# Patient Record
Sex: Female | Born: 1935 | Race: White | Hispanic: No | Marital: Married | Smoking: Never smoker
Health system: Southern US, Community
[De-identification: ages and names within clinical notes are randomized; demographics above are authoritative.]

## PROBLEM LIST (undated history)

## (undated) DIAGNOSIS — I10 Essential (primary) hypertension: Secondary | ICD-10-CM

## (undated) DIAGNOSIS — M5136 Other intervertebral disc degeneration, lumbar region: Secondary | ICD-10-CM

## (undated) DIAGNOSIS — M549 Dorsalgia, unspecified: Secondary | ICD-10-CM

## (undated) DIAGNOSIS — E785 Hyperlipidemia, unspecified: Secondary | ICD-10-CM

## (undated) DIAGNOSIS — C4491 Basal cell carcinoma of skin, unspecified: Secondary | ICD-10-CM

## (undated) DIAGNOSIS — M545 Low back pain, unspecified: Secondary | ICD-10-CM

## (undated) DIAGNOSIS — G8929 Other chronic pain: Secondary | ICD-10-CM

## (undated) DIAGNOSIS — N135 Crossing vessel and stricture of ureter without hydronephrosis: Secondary | ICD-10-CM

## (undated) DIAGNOSIS — R102 Pelvic and perineal pain: Secondary | ICD-10-CM

## (undated) DIAGNOSIS — M51369 Other intervertebral disc degeneration, lumbar region without mention of lumbar back pain or lower extremity pain: Secondary | ICD-10-CM

## (undated) HISTORY — DX: Other intervertebral disc degeneration, lumbar region without mention of lumbar back pain or lower extremity pain: M51.369

## (undated) HISTORY — DX: Low back pain: M54.5

## (undated) HISTORY — DX: Crossing vessel and stricture of ureter without hydronephrosis: N13.5

## (undated) HISTORY — PX: POLYPECTOMY: SHX149

## (undated) HISTORY — DX: Essential (primary) hypertension: I10

## (undated) HISTORY — DX: Low back pain, unspecified: M54.50

## (undated) HISTORY — DX: Other intervertebral disc degeneration, lumbar region: M51.36

## (undated) HISTORY — DX: Dorsalgia, unspecified: M54.9

## (undated) HISTORY — DX: Other chronic pain: G89.29

## (undated) HISTORY — DX: Hyperlipidemia, unspecified: E78.5

## (undated) HISTORY — DX: Basal cell carcinoma of skin, unspecified: C44.91

## (undated) HISTORY — DX: Pelvic and perineal pain: R10.2

---

## 1987-05-18 HISTORY — PX: LOBECTOMY: SHX5089

## 1987-05-18 HISTORY — PX: CYSTECTOMY: SUR359

## 1999-10-19 ENCOUNTER — Other Ambulatory Visit: Admission: RE | Admit: 1999-10-19 | Discharge: 1999-10-19 | Payer: Self-pay | Admitting: Obstetrics and Gynecology

## 2002-01-03 ENCOUNTER — Other Ambulatory Visit: Admission: RE | Admit: 2002-01-03 | Discharge: 2002-01-03 | Payer: Self-pay | Admitting: *Deleted

## 2003-05-18 DIAGNOSIS — R102 Pelvic and perineal pain: Secondary | ICD-10-CM

## 2003-05-18 HISTORY — DX: Pelvic and perineal pain: R10.2

## 2003-12-04 ENCOUNTER — Encounter: Admission: RE | Admit: 2003-12-04 | Discharge: 2003-12-04 | Payer: Self-pay | Admitting: Internal Medicine

## 2003-12-09 ENCOUNTER — Encounter: Admission: RE | Admit: 2003-12-09 | Discharge: 2003-12-09 | Payer: Self-pay | Admitting: Internal Medicine

## 2004-01-24 ENCOUNTER — Encounter (INDEPENDENT_AMBULATORY_CARE_PROVIDER_SITE_OTHER): Payer: Self-pay | Admitting: Specialist

## 2004-01-24 ENCOUNTER — Ambulatory Visit (HOSPITAL_COMMUNITY): Admission: RE | Admit: 2004-01-24 | Discharge: 2004-01-24 | Payer: Self-pay | Admitting: Obstetrics and Gynecology

## 2004-01-24 ENCOUNTER — Ambulatory Visit (HOSPITAL_BASED_OUTPATIENT_CLINIC_OR_DEPARTMENT_OTHER): Admission: RE | Admit: 2004-01-24 | Discharge: 2004-01-24 | Payer: Self-pay | Admitting: Obstetrics and Gynecology

## 2004-02-20 ENCOUNTER — Encounter: Payer: Self-pay | Admitting: Internal Medicine

## 2004-04-28 ENCOUNTER — Ambulatory Visit: Payer: Self-pay | Admitting: Internal Medicine

## 2004-07-28 ENCOUNTER — Ambulatory Visit (HOSPITAL_COMMUNITY): Admission: RE | Admit: 2004-07-28 | Discharge: 2004-07-28 | Payer: Self-pay | Admitting: Allergy and Immunology

## 2004-10-29 ENCOUNTER — Ambulatory Visit: Payer: Self-pay | Admitting: Internal Medicine

## 2004-11-18 ENCOUNTER — Encounter: Admission: RE | Admit: 2004-11-18 | Discharge: 2004-11-18 | Payer: Self-pay | Admitting: Internal Medicine

## 2004-11-18 ENCOUNTER — Ambulatory Visit: Payer: Self-pay | Admitting: Internal Medicine

## 2004-11-19 ENCOUNTER — Ambulatory Visit: Payer: Self-pay | Admitting: Internal Medicine

## 2004-12-09 ENCOUNTER — Other Ambulatory Visit: Admission: RE | Admit: 2004-12-09 | Discharge: 2004-12-09 | Payer: Self-pay | Admitting: *Deleted

## 2004-12-29 ENCOUNTER — Ambulatory Visit: Payer: Self-pay | Admitting: Internal Medicine

## 2005-01-29 ENCOUNTER — Ambulatory Visit: Payer: Self-pay | Admitting: Internal Medicine

## 2005-04-26 ENCOUNTER — Ambulatory Visit: Payer: Self-pay | Admitting: Internal Medicine

## 2005-10-29 ENCOUNTER — Ambulatory Visit: Payer: Self-pay | Admitting: Internal Medicine

## 2006-01-19 ENCOUNTER — Ambulatory Visit: Payer: Self-pay | Admitting: Internal Medicine

## 2006-02-09 ENCOUNTER — Ambulatory Visit: Payer: Self-pay | Admitting: Internal Medicine

## 2006-02-15 ENCOUNTER — Other Ambulatory Visit: Admission: RE | Admit: 2006-02-15 | Discharge: 2006-02-15 | Payer: Self-pay | Admitting: Obstetrics & Gynecology

## 2006-09-07 DIAGNOSIS — R0989 Other specified symptoms and signs involving the circulatory and respiratory systems: Secondary | ICD-10-CM | POA: Insufficient documentation

## 2006-10-14 ENCOUNTER — Ambulatory Visit: Payer: Self-pay | Admitting: Internal Medicine

## 2006-10-17 LAB — CONVERTED CEMR LAB
ALT: 18 units/L (ref 0–40)
AST: 20 units/L (ref 0–37)
Albumin: 4 g/dL (ref 3.5–5.2)
Alkaline Phosphatase: 60 units/L (ref 39–117)
BUN: 12 mg/dL (ref 6–23)
Bilirubin, Direct: 0.1 mg/dL (ref 0.0–0.3)
CO2: 28 meq/L (ref 19–32)
Calcium: 9.8 mg/dL (ref 8.4–10.5)
Chloride: 110 meq/L (ref 96–112)
Creatinine, Ser: 0.9 mg/dL (ref 0.4–1.2)
GFR calc Af Amer: 79 mL/min
GFR calc non Af Amer: 66 mL/min
Glucose, Bld: 100 mg/dL — ABNORMAL HIGH (ref 70–99)
Potassium: 4.2 meq/L (ref 3.5–5.1)
Sodium: 143 meq/L (ref 135–145)
TSH: 1.65 microintl units/mL (ref 0.35–5.50)
Total Bilirubin: 0.9 mg/dL (ref 0.3–1.2)
Total Protein: 7.2 g/dL (ref 6.0–8.3)

## 2006-11-30 ENCOUNTER — Ambulatory Visit: Payer: Self-pay | Admitting: Internal Medicine

## 2006-11-30 DIAGNOSIS — N135 Crossing vessel and stricture of ureter without hydronephrosis: Secondary | ICD-10-CM | POA: Insufficient documentation

## 2006-11-30 DIAGNOSIS — I10 Essential (primary) hypertension: Secondary | ICD-10-CM | POA: Insufficient documentation

## 2006-11-30 DIAGNOSIS — Z902 Acquired absence of lung [part of]: Secondary | ICD-10-CM | POA: Insufficient documentation

## 2006-12-03 LAB — CONVERTED CEMR LAB
BUN: 7 mg/dL (ref 6–23)
Basophils Absolute: 0.1 10*3/uL (ref 0.0–0.1)
Basophils Relative: 0.6 % (ref 0.0–1.0)
CO2: 29 meq/L (ref 19–32)
Calcium: 9.5 mg/dL (ref 8.4–10.5)
Chloride: 104 meq/L (ref 96–112)
Cholesterol: 240 mg/dL (ref 0–200)
Creatinine, Ser: 0.8 mg/dL (ref 0.4–1.2)
Direct LDL: 158.3 mg/dL
Eosinophils Absolute: 0.2 10*3/uL (ref 0.0–0.6)
Eosinophils Relative: 2.7 % (ref 0.0–5.0)
GFR calc Af Amer: 91 mL/min
GFR calc non Af Amer: 75 mL/min
Glucose, Bld: 113 mg/dL — ABNORMAL HIGH (ref 70–99)
HCT: 39.8 % (ref 36.0–46.0)
HDL: 37.2 mg/dL — ABNORMAL LOW (ref >39.0)
Hemoglobin: 13.6 g/dL (ref 12.0–15.0)
Hgb A1c MFr Bld: 6.6 % — ABNORMAL HIGH (ref 4.6–6.0)
Lymphocytes Relative: 34.7 % (ref 12.0–46.0)
MCHC: 34.1 g/dL (ref 30.0–36.0)
MCV: 89.4 fL (ref 78.0–100.0)
Monocytes Absolute: 0.9 10*3/uL — ABNORMAL HIGH (ref 0.2–0.7)
Monocytes Relative: 10 % (ref 3.0–11.0)
Neutro Abs: 4.7 10*3/uL (ref 1.4–7.7)
Neutrophils Relative %: 52 % (ref 43.0–77.0)
Platelets: 318 10*3/uL (ref 150–400)
Potassium: 4.1 meq/L (ref 3.5–5.1)
RBC: 4.45 M/uL (ref 3.87–5.11)
RDW: 12.7 % (ref 11.5–14.6)
Sodium: 141 meq/L (ref 135–145)
TSH: 1.42 microintl units/mL (ref 0.35–5.50)
Total CHOL/HDL Ratio: 6.5
Triglycerides: 399 mg/dL (ref 0–149)
VLDL: 80 mg/dL — ABNORMAL HIGH (ref 0–40)
WBC: 9 10*3/uL (ref 4.5–10.5)

## 2006-12-06 ENCOUNTER — Ambulatory Visit: Payer: Self-pay | Admitting: Internal Medicine

## 2007-07-28 ENCOUNTER — Encounter: Payer: Self-pay | Admitting: Internal Medicine

## 2007-08-02 ENCOUNTER — Encounter (INDEPENDENT_AMBULATORY_CARE_PROVIDER_SITE_OTHER): Payer: Self-pay | Admitting: *Deleted

## 2007-08-02 ENCOUNTER — Encounter: Payer: Self-pay | Admitting: Internal Medicine

## 2007-09-08 ENCOUNTER — Encounter (INDEPENDENT_AMBULATORY_CARE_PROVIDER_SITE_OTHER): Payer: Self-pay | Admitting: *Deleted

## 2007-10-11 ENCOUNTER — Ambulatory Visit: Payer: Self-pay | Admitting: Internal Medicine

## 2007-10-11 DIAGNOSIS — G8929 Other chronic pain: Secondary | ICD-10-CM | POA: Insufficient documentation

## 2007-10-11 DIAGNOSIS — M549 Dorsalgia, unspecified: Secondary | ICD-10-CM

## 2007-10-20 ENCOUNTER — Telehealth (INDEPENDENT_AMBULATORY_CARE_PROVIDER_SITE_OTHER): Payer: Self-pay | Admitting: *Deleted

## 2008-05-01 ENCOUNTER — Ambulatory Visit: Payer: Self-pay | Admitting: Internal Medicine

## 2008-05-01 ENCOUNTER — Encounter: Payer: Self-pay | Admitting: Internal Medicine

## 2008-05-01 DIAGNOSIS — E1149 Type 2 diabetes mellitus with other diabetic neurological complication: Secondary | ICD-10-CM | POA: Insufficient documentation

## 2008-05-01 DIAGNOSIS — E785 Hyperlipidemia, unspecified: Secondary | ICD-10-CM | POA: Insufficient documentation

## 2008-05-01 LAB — CONVERTED CEMR LAB
Bilirubin Urine: NEGATIVE
Glucose, Urine, Semiquant: NEGATIVE
Ketones, urine, test strip: NEGATIVE
Nitrite: NEGATIVE
Protein, U semiquant: NEGATIVE
RBC / HPF: NONE SEEN (ref <3)
Specific Gravity, Urine: 1.015
Urobilinogen, UA: 0.2
pH: 5

## 2008-05-02 ENCOUNTER — Telehealth (INDEPENDENT_AMBULATORY_CARE_PROVIDER_SITE_OTHER): Payer: Self-pay | Admitting: *Deleted

## 2008-05-02 ENCOUNTER — Encounter: Payer: Self-pay | Admitting: Internal Medicine

## 2008-05-03 LAB — CONVERTED CEMR LAB
ALT: 20 units/L (ref 0–35)
AST: 26 units/L (ref 0–37)
BUN: 12 mg/dL (ref 6–23)
CO2: 29 meq/L (ref 19–32)
Calcium, Total (PTH): 9.9 mg/dL (ref 8.4–10.5)
Calcium: 10 mg/dL (ref 8.4–10.5)
Chloride: 100 meq/L (ref 96–112)
Cholesterol: 281 mg/dL (ref 0–200)
Creatinine, Ser: 0.7 mg/dL (ref 0.4–1.2)
Creatinine,U: 175.7 mg/dL
Direct LDL: 167.2 mg/dL
GFR calc Af Amer: 105 mL/min
GFR calc non Af Amer: 87 mL/min
Glucose, Bld: 84 mg/dL (ref 70–99)
HDL: 42.2 mg/dL (ref >39.0)
Hemoglobin: 13.6 g/dL (ref 12.0–15.0)
Hgb A1c MFr Bld: 6.7 % — ABNORMAL HIGH (ref 4.6–6.0)
Microalb Creat Ratio: 3.4 mg/g (ref 0.0–30.0)
Microalb, Ur: 0.6 mg/dL (ref 0.0–1.9)
PTH: 33.2 pg/mL (ref 14.0–72.0)
Potassium: 4.3 meq/L (ref 3.5–5.1)
Sodium: 137 meq/L (ref 135–145)
TSH: 1.73 microintl units/mL (ref 0.35–5.50)
Total CHOL/HDL Ratio: 6.7
Triglycerides: 392 mg/dL (ref 0–149)
VLDL: 78 mg/dL — ABNORMAL HIGH (ref 0–40)

## 2008-06-11 ENCOUNTER — Encounter: Admission: RE | Admit: 2008-06-11 | Discharge: 2008-09-09 | Payer: Self-pay | Admitting: Internal Medicine

## 2008-06-11 ENCOUNTER — Encounter: Payer: Self-pay | Admitting: Internal Medicine

## 2008-06-12 ENCOUNTER — Telehealth (INDEPENDENT_AMBULATORY_CARE_PROVIDER_SITE_OTHER): Payer: Self-pay | Admitting: *Deleted

## 2008-09-20 ENCOUNTER — Ambulatory Visit: Payer: Self-pay | Admitting: Internal Medicine

## 2008-09-24 LAB — CONVERTED CEMR LAB
Cholesterol: 260 mg/dL — ABNORMAL HIGH (ref 0–200)
Direct LDL: 174.7 mg/dL
HDL: 37.8 mg/dL — ABNORMAL LOW (ref >39.00)
Hgb A1c MFr Bld: 6.6 % — ABNORMAL HIGH (ref 4.6–6.5)
Total CHOL/HDL Ratio: 7
Triglycerides: 304 mg/dL — ABNORMAL HIGH (ref 0.0–149.0)
VLDL: 60.8 mg/dL — ABNORMAL HIGH (ref 0.0–40.0)

## 2008-10-09 ENCOUNTER — Encounter: Payer: Self-pay | Admitting: Internal Medicine

## 2008-10-25 ENCOUNTER — Encounter (INDEPENDENT_AMBULATORY_CARE_PROVIDER_SITE_OTHER): Payer: Self-pay | Admitting: *Deleted

## 2008-10-31 ENCOUNTER — Telehealth: Payer: Self-pay | Admitting: Internal Medicine

## 2008-12-04 ENCOUNTER — Encounter: Payer: Self-pay | Admitting: Internal Medicine

## 2009-01-13 ENCOUNTER — Encounter: Payer: Self-pay | Admitting: Internal Medicine

## 2009-01-13 ENCOUNTER — Ambulatory Visit: Payer: Self-pay | Admitting: Family Medicine

## 2009-01-28 ENCOUNTER — Encounter (INDEPENDENT_AMBULATORY_CARE_PROVIDER_SITE_OTHER): Payer: Self-pay | Admitting: *Deleted

## 2009-02-11 ENCOUNTER — Encounter (INDEPENDENT_AMBULATORY_CARE_PROVIDER_SITE_OTHER): Payer: Self-pay | Admitting: *Deleted

## 2009-10-02 ENCOUNTER — Telehealth (INDEPENDENT_AMBULATORY_CARE_PROVIDER_SITE_OTHER): Payer: Self-pay | Admitting: *Deleted

## 2009-10-07 ENCOUNTER — Telehealth: Payer: Self-pay | Admitting: Internal Medicine

## 2009-10-08 ENCOUNTER — Ambulatory Visit: Payer: Self-pay | Admitting: Internal Medicine

## 2009-10-10 ENCOUNTER — Telehealth (INDEPENDENT_AMBULATORY_CARE_PROVIDER_SITE_OTHER): Payer: Self-pay | Admitting: *Deleted

## 2009-10-10 LAB — CONVERTED CEMR LAB
ALT: 18 units/L (ref 0–35)
AST: 21 units/L (ref 0–37)
BUN: 12 mg/dL (ref 6–23)
Basophils Absolute: 0.1 10*3/uL (ref 0.0–0.1)
Basophils Relative: 0.6 % (ref 0.0–3.0)
CO2: 29 meq/L (ref 19–32)
Calcium: 9.6 mg/dL (ref 8.4–10.5)
Chloride: 102 meq/L (ref 96–112)
Cholesterol: 248 mg/dL — ABNORMAL HIGH (ref 0–200)
Creatinine, Ser: 0.8 mg/dL (ref 0.4–1.2)
Creatinine,U: 206.7 mg/dL
Direct LDL: 136.2 mg/dL
Eosinophils Absolute: 0.3 10*3/uL (ref 0.0–0.7)
Eosinophils Relative: 3.9 % (ref 0.0–5.0)
GFR calc non Af Amer: 78.95 mL/min (ref >60)
Glucose, Bld: 95 mg/dL (ref 70–99)
HCT: 40.2 % (ref 36.0–46.0)
HDL: 41.9 mg/dL (ref >39.00)
Hemoglobin: 13.9 g/dL (ref 12.0–15.0)
Hgb A1c MFr Bld: 6.6 % — ABNORMAL HIGH (ref 4.6–6.5)
Lymphocytes Relative: 38.4 % (ref 12.0–46.0)
Lymphs Abs: 3.2 10*3/uL (ref 0.7–4.0)
MCHC: 34.6 g/dL (ref 30.0–36.0)
MCV: 90.4 fL (ref 78.0–100.0)
Microalb Creat Ratio: 0.7 mg/g (ref 0.0–30.0)
Microalb, Ur: 1.5 mg/dL (ref 0.0–1.9)
Monocytes Absolute: 0.9 10*3/uL (ref 0.1–1.0)
Monocytes Relative: 10.5 % (ref 3.0–12.0)
Neutro Abs: 3.9 10*3/uL (ref 1.4–7.7)
Neutrophils Relative %: 46.6 % (ref 43.0–77.0)
Platelets: 272 10*3/uL (ref 150.0–400.0)
Potassium: 4.7 meq/L (ref 3.5–5.1)
RBC: 4.45 M/uL (ref 3.87–5.11)
RDW: 13.7 % (ref 11.5–14.6)
Sodium: 141 meq/L (ref 135–145)
Total CHOL/HDL Ratio: 6
Triglycerides: 518 mg/dL — ABNORMAL HIGH (ref 0.0–149.0)
VLDL: 103.6 mg/dL — ABNORMAL HIGH (ref 0.0–40.0)
WBC: 8.4 10*3/uL (ref 4.5–10.5)

## 2009-10-27 ENCOUNTER — Encounter (INDEPENDENT_AMBULATORY_CARE_PROVIDER_SITE_OTHER): Payer: Self-pay | Admitting: *Deleted

## 2009-10-30 ENCOUNTER — Telehealth (INDEPENDENT_AMBULATORY_CARE_PROVIDER_SITE_OTHER): Payer: Self-pay | Admitting: *Deleted

## 2009-11-27 ENCOUNTER — Encounter (INDEPENDENT_AMBULATORY_CARE_PROVIDER_SITE_OTHER): Payer: Self-pay | Admitting: *Deleted

## 2009-12-01 ENCOUNTER — Ambulatory Visit: Payer: Self-pay | Admitting: Internal Medicine

## 2009-12-08 ENCOUNTER — Ambulatory Visit: Payer: Self-pay | Admitting: Internal Medicine

## 2009-12-09 ENCOUNTER — Encounter: Payer: Self-pay | Admitting: Internal Medicine

## 2010-01-13 ENCOUNTER — Encounter: Payer: Self-pay | Admitting: Internal Medicine

## 2010-01-30 ENCOUNTER — Encounter (INDEPENDENT_AMBULATORY_CARE_PROVIDER_SITE_OTHER): Payer: Self-pay | Admitting: *Deleted

## 2010-02-02 ENCOUNTER — Ambulatory Visit: Payer: Self-pay | Admitting: Internal Medicine

## 2010-02-09 ENCOUNTER — Ambulatory Visit: Payer: Self-pay | Admitting: Internal Medicine

## 2010-02-09 LAB — CONVERTED CEMR LAB
Cholesterol: 293 mg/dL — ABNORMAL HIGH (ref 0–200)
Direct LDL: 159.6 mg/dL
HDL: 42.4 mg/dL (ref >39.00)
Hgb A1c MFr Bld: 6.9 % — ABNORMAL HIGH (ref 4.6–6.5)
TSH: 1.66 microintl units/mL (ref 0.35–5.50)
Total CHOL/HDL Ratio: 7
Triglycerides: 544 mg/dL — ABNORMAL HIGH (ref 0.0–149.0)
VLDL: 108.8 mg/dL — ABNORMAL HIGH (ref 0.0–40.0)

## 2010-04-15 ENCOUNTER — Encounter: Payer: Self-pay | Admitting: Internal Medicine

## 2010-06-10 ENCOUNTER — Other Ambulatory Visit: Payer: Self-pay | Admitting: Internal Medicine

## 2010-06-10 ENCOUNTER — Ambulatory Visit
Admission: RE | Admit: 2010-06-10 | Discharge: 2010-06-10 | Payer: Self-pay | Source: Home / Self Care | Attending: Internal Medicine | Admitting: Internal Medicine

## 2010-06-10 LAB — BASIC METABOLIC PANEL
BUN: 13 mg/dL (ref 6–23)
CO2: 27 mEq/L (ref 19–32)
Calcium: 9.6 mg/dL (ref 8.4–10.5)
Chloride: 104 mEq/L (ref 96–112)
Creatinine, Ser: 0.9 mg/dL (ref 0.4–1.2)
GFR: 68.33 mL/min (ref >60.00)
Glucose, Bld: 122 mg/dL — ABNORMAL HIGH (ref 70–99)
Potassium: 4.5 mEq/L (ref 3.5–5.1)
Sodium: 139 mEq/L (ref 135–145)

## 2010-06-10 LAB — HEMOGLOBIN A1C: Hgb A1c MFr Bld: 6.8 % — ABNORMAL HIGH (ref 4.6–6.5)

## 2010-06-16 NOTE — Letter (Signed)
Summary: Results Follow up Letter  [facility] [place]  [address] [place], [place] [address]   Phone: [phone]  Fax: [phone]    10/25/2008 MRN: [id number]  ANEISA KARREN [address] [place], [place]  [address]  Dear Ms. Jimmerson,  The following are the results of your recent test(s):  Test         Result    Pap Smear:        Normal _____  Not Normal _____ Comments: ______________________________________________________ Cholesterol: LDL(Bad cholesterol):         Your goal is less than:         HDL (Good cholesterol):       Your goal is more than: Comments:  ______________________________________________________ Mammogram:        Normal __x___  Not Normal _____ Comments:  _____________Your mammogram was normal.  Next mammogram is due in 1 year.  You will still need to have a yearly breast exam.  We can do that @ your next office visit.  Please call me if you have any questions. ______________________________________________________ Hemoccult:        Normal _____  Not normal _______ Comments:    _____________________________________________________________________ Other Tests:    We routinely do not discuss normal results over the telephone.  If you desire a copy of the results, or you have any questions about this information we can discuss them at your next office visit.   Sincerely,

## 2010-06-16 NOTE — Miscellaneous (Signed)
Summary: [facility] PV  Clinical Lists Changes  Medications: Added new medication of MIRALAX   POWD (POLYETHYLENE GLYCOL 3350) As per prep  instructions. - Signed Added new medication of DULCOLAX 5 MG  TBEC (BISACODYL) Day before procedure take 2 at 3pm and 2 at 8pm. - Signed Added new medication of REGLAN 10 MG  TABS (METOCLOPRAMIDE HCL) As per prep instructions. - Signed Rx of MIRALAX   POWD (POLYETHYLENE GLYCOL 3350) As per prep  instructions.;  #255gm x 0;  Signed;  Entered by: Ezra Sites RN;  Authorized by: Hart Carwin MD;  Method used: Electronically to [facility]. #[id number]*, [address] [place], [place], [place]  [address], Ph: [phone], Fax: [phone] Rx of DULCOLAX 5 MG  TBEC (BISACODYL) Day before procedure take 2 at 3pm and 2 at 8pm.;  #4 x 0;  Signed;  Entered by: Ezra Sites RN;  Authorized by: Hart Carwin MD;  Method used: Electronically to [facility]. #[id number]*, [address] [place], [place], [place]  [address], Ph: [phone], Fax: [phone] Rx of REGLAN 10 MG  TABS (METOCLOPRAMIDE HCL) As per prep instructions.;  #2 x 0;  Signed;  Entered by: Ezra Sites RN;  Authorized by: Hart Carwin MD;  Method used: Electronically to [facility]. #[id number]*, [address] [place], [place], [place]  [address], Ph: [phone], Fax: [phone]    Prescriptions: REGLAN 10 MG  TABS (METOCLOPRAMIDE HCL) As per prep instructions.  #2 x 0   Entered by:   Ezra Sites RN   Authorized by:   Hart Carwin MD   Signed by:   Ezra Sites RN on 12/01/2009   Method used:   Electronically to        [facility]. [phone]* (retail)       [phone] [address].       [place], [place]  [address]       Ph: [phone]       Fax: [phone]   RxID:   [id number] DULCOLAX 5 MG  TBEC (BISACODYL) Day before procedure take 2 at 3pm and 2 at 8pm.  #4 x 0   Entered by:   Ezra Sites RN   Authorized  by:   Hart Carwin MD   Signed by:   Ezra Sites RN on 12/01/2009   Method used:   Electronically to        [facility]. [phone]* (retail)       [phone] [address].       [place], [place]  [address]       Ph: [phone]       Fax: [phone]   RxID:   [id number] MIRALAX   POWD (POLYETHYLENE GLYCOL 3350) As per prep  instructions.  #255gm x 0   Entered by:   Ezra Sites RN   Authorized by:   Hart Carwin MD   Signed by:   Ezra Sites RN on 12/01/2009   Method used:   Electronically to        [facility]. [phone]* (retail)       [phone] [address].       [place], [place]  [address]       Ph: [phone]       Fax: [phone]   RxID:  [id number]  

## 2010-06-16 NOTE — Letter (Signed)
Summary: Patient Cancellation/[facility]  Patient Cancellation/[facility]   Imported By: Lanelle Bal 02/16/2010 08:59:20  _____________________________________________________________________  External Attachment:    Type:   Image     Comment:   External Document

## 2010-06-16 NOTE — Letter (Signed)
Summary: Allergy OV note  Allergy   Imported By: Freddy Jaksch 08/15/2007 10:40:02  _____________________________________________________________________  External Attachment:    Type:   Image     Comment:   External Document

## 2010-06-16 NOTE — Progress Notes (Signed)
Summary: paz--rx mobic  Phone Note Refill Request   Refills Requested: Medication #1:  MOBIC 7.5 MG TABS once daily as needed [facility] [address]--[phone] for 90 day supply  Initial call taken by: Freddy Jaksch,  June 12, 2008 4:18 PM  Follow-up for Phone Call        last ov 05/01/08, last refill #30 x 6 on 10/11/07 Follow-up by: Kandice Hams,  June 13, 2008 10:13 AM  Additional Follow-up for Phone Call Additional follow up Details #1::        ok 30 and 6RF Additional Follow-up by: [facility] E. Paz MD,  June 13, 2008 1:36 PM      Prescriptions: MOBIC 7.5 MG TABS (MELOXICAM) once daily as needed  #90 x 1   Entered by:   Kandice Hams   Authorized by:   Nolon Rod. Paz MD   Signed by:   Kandice Hams on 06/13/2008   Method used:   Re-Faxed to ...       [facility]. [phone]* (retail)       [phone] [address].       [place], [place]  [address]       Ph: [phone]       Fax: [phone]   RxID:   [id number] MOBIC 7.5 MG TABS (MELOXICAM) once daily as needed  #30 x 6   Entered by:   Kandice Hams   Authorized by:   Nolon Rod. Paz MD   Signed by:   Kandice Hams on 06/13/2008   Method used:   Faxed to ...       [facility]. [phone]* (retail)       [phone] [address].       [place], [place]  [address]       Ph: [phone]       Fax: [phone]   RxID:   [id number]

## 2010-06-16 NOTE — Progress Notes (Signed)
Summary: Schedule Colonoscopy.   Phone Note Outgoing Call Call back at [facility] Phone [phone]   Call placed by: Harlow Mares CMA Duncan Dull),  Oct 07, 2009 11:59 AM Call placed to: Patient Summary of Call: the phone number just rings, patient is due for her colonoscopy. Initial call taken by: Harlow Mares CMA Duncan Dull),  Oct 07, 2009 11:59 AM  Follow-up for Phone Call        colonosocpy scheduled for 12/08/2009 11am and previsit on 12/01/2009 at 10:30am Follow-up by: Harlow Mares CMA Duncan Dull),  October 27, 2009 12:55 PM

## 2010-06-16 NOTE — Progress Notes (Signed)
Summary: paz--Eye medicine to continue or not/Please See  Phone Note Call from Patient Call back at Home Phone [phone] Call back at [phone]   Caller: Patient Summary of Call: Pt is calling staten that she was seen on Wed of last week was given eye drop for her eyes and she still have eye drop left that she was to use for 5 days and want to know should she contine of have more.  Initial call taken by: Freddy Jaksch,  October 20, 2007 9:06 AM  Follow-up for Phone Call        Spoke with pt who was seen 10/11/07 for conjuctivitis and wasg fiven gentamycin abx , pt says EYES ARE BETTER, one better than the other still have some crusting, still have a little left instuctions were to use for 5 days used for 7 days using cold compresses on eye pt sais she has a little abx drops left please advise  [facility].Kandice Hams  October 20, 2007 9:45 AM  Follow-up by: Kandice Hams,  October 20, 2007 9:45 AM  Additional Follow-up for Phone Call Additional follow up Details #1::        allergic conjuntivitis? advise pt: stop Abx eye drops start Alocril, see RX....Marland KitchenMarland KitchenJose E. Paz MD  October 22, 2007 9:09 AM     Additional Follow-up for Phone Call Additional follow up Details #2::    CALLED PATIENT AT [phone], UNABLE TO REACH PATIENT, VOICE MAILBOX NOT SET UP YET. CALLED PATIENT ON HOME NUMBER, GAVE DR.PAZ'S INSTRUCTION, PATIENT INFORMED DR.PAZ SENT RX TO PHARMACY ALREADY. Shonna Chock  October 23, 2007 9:31 AM   New/Updated Medications: ALOCRIL 2 %  SOLN (NEDOCROMIL SODIUM) 2 drops on each eye two times a day  x 4 weeks   Prescriptions: ALOCRIL 2 %  SOLN (NEDOCROMIL SODIUM) 2 drops on each eye two times a day  x 4 weeks  #1 x 1   Entered by:   Jose E. Paz MD   Authorized by:   Madilyn Fireman Triage Nurse   Signed by:   Nolon Rod Paz MD on 10/22/2007   Method used:   Electronically sent to ...       [facility]. #[id number]*       [phone]       [place], [place]   [address]       Ph: [phone]       Fax: [phone]   RxID:   [id number]

## 2010-06-16 NOTE — Assessment & Plan Note (Signed)
Summary: yearly check/cbs   Vital Signs:  Patient profile:   77 year old female Height:      66 inches Weight:      197.38 pounds Pulse rate:   84 / minute Pulse rhythm:   regular BP sitting:   134 / 82  (left arm) Cuff size:   large  Vitals Entered By: Army Fossa CMA (February 02, 2010 10:02 AM) CC: Yearly check, fasting Comments pharm- [facility] [address] flu shot Due for bone density   History of Present Illness: Here for Medicare AWV:  1.   Risk factors based on Past M, S, F history: yes  2.   Physical Activities: active , [facility], has lost some wt  3.   Depression/mood: occasionally feels "down" take OTC "[place]" and helps , no severe                 symptoms, no suicidal  4.   Hearing: slightly  decreased L>R, not causing problems but will refer to an audiologist 5.   ADL's: totally independent  6.   Fall Risk: low  7.   Home Safety: feeels safe at home  8.   Height, weight, &visual acuity:, see VS, vision decreased L>R, just saw the eye doctor 9.   Counseling: yes  10.   Labs ordered based on risk factors: yes  11.           Referral Coordination: if needed  12.           Care Plan: see a/p  13.            Cognitive Assessment , motor skills, memory and cognition seem appropriate  in addition, we discussed the following issues  HYPERTENSION -- ambulatory BPs in the 130s/80s  AODM -- no ambulatory CBGs , diet has improved a little  h/o COLONIC POLYPS -- last Cscope reviewed  Hyperlipidemia-- on no meds, trying to work on life style     Preventive Screening-Counseling & Management  Alcohol-Tobacco     Alcohol type: wine  Caffeine-Diet-Exercise     Does Patient Exercise: yes     [facility]     Times/week: 3  Current Medications (verified): 1)  Furosemide 20 Mg  Tabs (Furosemide) .Marland Kitchen.. 1by Mouth Once Daily 2)  Loratadine 10 Mg Tabs (Loratadine) .... Take 1 Tablet By Mouth Once A Day 3)  Lisinopril  20 Mg Tabs (Lisinopril) .... One By Mouth Daily 4)  Mobic 7.5 Mg Tabs (Meloxicam) .... Once Daily As Needed 5)  Nasonex 50 Mcg/act Susp (Mometasone Furoate) .... Spray 2 Spray Into Both Nostrils Every Morning 6)  Allergy Injections 7)  Fish Oil 8)  Vitamin D 9)  Multivitamins   Tabs (Multiple Vitamin) 10)  Tylenol 11)  Maalox 12)  Vitamin C 13)  Aspirin Adult Low Strength 81 Mg Tbec (Aspirin) .Marland Kitchen.. 1 A Day  Allergies: 1)  ! Sulfa 2)  ! Ceftin 3)  ! * Cefzil 4)  ! Pcn 5)  ! Suprax 6)  ! Macrobid  Past History:  Past Medical History: Reviewed history from 09/20/2008 and no changes required. 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 RML lobectomy  1989  thyroid cystectomy, Colon polypectomy  Family History: Reviewed history from 05/01/2008 and no changes required. Family History of Arthritis Diabetes-- B x 2  hypertension-- B, M  brain tumor--M breast Ca--no colon ca--no  Social History: Reviewed history from 10/08/2009 and no changes required. Married four children Retired, Lawyer  tobacco-- never ETOH-- rarely diet-- not good  "terrible" exercise--none  Does Patient Exercise:  yes  Review of Systems CV:  Denies chest pain or discomfort and palpitations; 0cc swelling feet . Resp:  Denies cough, shortness of breath, and wheezing. GI:  Denies bloody stools, diarrhea, nausea, and vomiting; occasionally feels bloated . GU:  Denies dysuria and hematuria.  Physical Exam  General:  alert and well-developed.  alert and well-developed.   Neck:  no  masses, no thyromegaly, and normal carotid upstroke.   Lungs:  normal respiratory effort, no intercostal retractions, and no accessory muscle use.    Heart:  normal rate, regular rhythm, and no murmur.   Abdomen:  soft, non-tender, no distention, no masses, no guarding, and no rigidity.    Extremities:  no pretibial edema bilaterally  Psych:  Oriented X3, memory intact for recent and remote, normally interactive, good eye contact, not anxious appearing, and not depressed appearing.     Impression & Recommendations:  Problem # 1:  HEALTH MAINTENANCE EXAM (ICD-V70.0) Td 2009 pneumonia shot 04-2005 Flu shot today Shingles immunization discussed, printed material provided  -- will do today   Female care per gynecology. due to see them, encouraged  to do mammogram 12/2009, normal PAPs per gyn   DEXA 11-2006, normaL, and 12-2008 normal as well    last colonoscopy 7-11, next in 5 years   refer to a audiologist for decreased hearing     Orders: Misc. Referral (Misc. Ref) Medicare -1st Annual Wellness Visit [phone])  Problem # 2:  DISEASE, LUNG, OTHER NEC (ICD-518.89) used to get chest x-rays yearly, lites a chest x-ray done at this time  Orders: T-2 View CXR (71020TC) Specimen Handling (30865)  Problem # 3:  HYPERLIPIDEMIA (ICD-272.4) improving lifestyle, labs Intolerant to  Trilipix  Niaspan-- intolerant as well, shaky , flushed  The following medications were removed from the medication list:    Trilipix 135 Mg Cpdr (Choline fenofibrate) .Marland Kitchen... 1 by mouth once daily  Orders: Venipuncture (78469) TLB-Lipid Panel (80061-LIPID) TLB-TSH (Thyroid Stimulating Hormone) (84443-TSH) Specimen Handling (62952)  Problem # 4:  HYPERTENSION (ICD-401.9) at goal  Her updated medication list for this problem includes:    Furosemide 20 Mg Tabs (Furosemide) .Marland KitchenMarland KitchenMarland KitchenMarland Kitchen 1by mouth once daily    Lisinopril 20 Mg Tabs (Lisinopril) ..... One by mouth daily  BP today: 134/82 Prior BP: 150/92  (10/08/2009)  Labs Reviewed: K+: 4.7 (10/08/2009) Creat: : 0.8 (10/08/2009)   Chol: 248 (10/08/2009)   HDL: 41.90 (10/08/2009)   LDL: DEL (05/01/2008)   TG: 518.0 (10/08/2009)  Problem # 5:  AODM (ICD-250.00) one diet only Her updated medication list for this problem includes:    Lisinopril 20 Mg Tabs (Lisinopril) ..... One by mouth daily    Aspirin Adult Low Strength 81 Mg Tbec (Aspirin) .Marland Kitchen... 1 a day    Labs Reviewed: Creat: 0.8 (10/08/2009)    Reviewed HgBA1c results: 6.6 (10/08/2009)  6.6 (09/20/2008)  Orders: TLB-A1C / Hgb A1C (Glycohemoglobin) (83036-A1C) Specimen Handling ([address])  Complete Medication List: 1)  Furosemide 20  Mg Tabs (Furosemide) .Marland Kitchen.. 1by mouth once daily 2)  Loratadine 10 Mg Tabs (Loratadine) .... Take 1 tablet by mouth once a day 3)  Lisinopril 20 Mg Tabs (Lisinopril) .... One by mouth daily 4)  Mobic 7.5 Mg Tabs (Meloxicam) .... Once daily as needed 5)  Nasonex 50 Mcg/act Susp (Mometasone furoate) .... Spray 2 spray into both nostrils every morning 6)  Allergy Injections  7)  Fish Oil  8)  Vitamin D  9)  Multivitamins Tabs (Multiple vitamin) 10)  Tylenol  11)  Maalox  12)  Vitamin C  13)  Aspirin Adult Low Strength 81 Mg Tbec (Aspirin) .Marland Kitchen.. 1 a day  Other Orders: Flu Vaccine 69yrs + ([address]) Administration Flu vaccine - MCR (G0008) Zoster (Shingles) Vaccine Live (60454) Admin 1st Vaccine (09811) Admin of Any Addtl Vaccine (91478) Admin 1st Vaccine (State) [id number]) Admin of Any Addtl Vaccine (State) (30865H)  Patient Instructions: 1)  Please schedule a follow-up appointment in 4 months .  Prescriptions: FUROSEMIDE 20 MG  TABS (FUROSEMIDE) 1by mouth once daily  #90 Tablet x 1   Entered by:   Army Fossa CMA   Authorized by:   Nolon Rod. Paz MD   Signed by:   Army Fossa CMA on 02/02/2010   Method used:   Electronically to        [facility]. [phone]* (retail)       [phone] [address].       [place], [place]  [address]       Ph: [phone]       Fax: [phone]   RxID:   [id number] LISINOPRIL 20 MG TABS (LISINOPRIL) one by mouth daily  #90 x 1   Entered by:   Army Fossa CMA   Authorized by:   Nolon Rod. Paz MD   Signed by:   Army Fossa CMA on 02/02/2010   Method used:   Electronically to        [facility]. [phone]* (retail)       [phone] [address].       [place], [place]  [address]       Ph: [phone]       Fax: [phone]   RxID:   [id number]       Risk Factors:     Type:  wine Exercise:  yes    Times per week:  3    Type[facility]  [facility] Flu Vacc[facility] Consent Questions     Do you have a history of severe allergic reactions to this vaccine? no    Any prior history of allergic reactions to egg and/or gelatin? no    Do you have a sensitivity to the preservative Thimersol? no    Do you have a past history of Guillan-Barre Syndrome? no    Do you currently have an acute febrile illness? no    Have you ever had a severe reaction to latex? no    Vaccine information given and explained to patient? yes    Are you currently pregnant? no    Lot Number:[id number]   Exp Date:11/13/2009   Site Given  Left Deltoid [facility] to [facility]     .lbmedflu   Zostavax # 1    Vaccine Type: Zostavax    Site: right deltoid    Mfr: Merck    Dose: 0.5 ml    Route: [place]  Given by: Army Fossa CMA    Exp. Date: 01/02/2011    Lot #: [id number]

## 2010-06-16 NOTE — Letter (Signed)
Summary: [facility]  [facility]   Imported By: Lanelle Bal 12/16/2008 12:17:36  _____________________________________________________________________  External Attachment:    Type:   Image     Comment:   External Document

## 2010-06-16 NOTE — Letter (Signed)
Summary: Results Follow up Letter  [facility] [place]  [address] [place], [place] [address]   Phone: [phone]  Fax: [phone]    02/11/2009 MRN: [id number]  Natalie [name] [address] [place], [place]  [address]  Dear Ms. Reffitt,  The following are the results of your recent test(s):  Test         Result    Pap Smear:        Normal _____  Not Normal _____ Comments: ______________________________________________________ Cholesterol: LDL(Bad cholesterol):         Your goal is less than:         HDL (Good cholesterol):       Your goal is more than: Comments:  ______________________________________________________ Mammogram:        Normal _____  Not Normal _____ Comments:  ___________________________________________________________________ Hemoccult:        Normal _____  Not normal _______ Comments:    _____________________________________________________________________ Other Tests:  Dr. Drue Novel reviewed the results of your bone density test.  Your bone denisity test was normal.  Please continue taking calcium & vitamin d daily.  Call me with any questions or concerns. Alena Bills [phone] ext [phone]

## 2010-06-16 NOTE — Progress Notes (Signed)
Summary: due ov  Phone Note Outgoing Call Call back at [facility] Phone [phone]   Summary of Call: Due office visit for additional refills.Marland KitchenMarland KitchenShary Decamp  Oct 02, 2009 11:46 AM   Follow-up for Phone Call        Patient is coming in on 5.25.11 Follow-up by: Harold Barban,  Oct 02, 2009 1:15 PM

## 2010-06-16 NOTE — Letter (Signed)
Summary: Previsit letter  [facility]  [address] [place], [place] [address]   Phone: [phone]  Fax: [phone]       10/27/2009 MRN: [id number]  Natalie [name] [address] [place], [place]  [address]  Dear Ms. Klunk,  Welcome to the Gastroenterology Division at [facility].    You are scheduled to see a nurse for your pre-procedure visit on 12/01/2009 at 10:30am on the 3rd floor at [facility], [address]  We ask that you try to arrive at our office 15 minutes prior to your appointment time to allow for check-in.  Your nurse visit will consist of discussing your medical and surgical history, your immediate family medical history, and your medications.    Please bring a complete list of all your medications or, if you prefer, bring the medication bottles and we will list them.  We will need to be aware of both prescribed and over the counter drugs.  We will need to know exact dosage information as well.  If you are on blood thinners (Coumadin, Plavix, Aggrenox, Ticlid, etc.) please call our office today/prior to your appointment, as we need to consult with your physician about holding your medication.   Please be prepared to read and sign documents such as consent forms, a financial agreement, and acknowledgement forms.  If necessary, and with your consent, a friend or relative is welcome to sit-in on the nurse visit with you.  Please bring your insurance card so that we may make a copy of it.  If your insurance requires a referral to see a specialist, please bring your referral form from your primary care physician.  No co-pay is required for this nurse visit.     If you cannot keep your appointment, please call [phone] to cancel or reschedule prior to your appointment date.  This allows [place] the opportunity to schedule an appointment for another patient in need of care.    Thank you for choosing [facility] for your medical  needs.  We appreciate the opportunity to care for you.  Please visit [place] at our website  to learn more about our practice.                     Sincerely.                                                                                                                   The Gastroenterology Division

## 2010-06-16 NOTE — Procedures (Signed)
Summary: Colonoscopy  Patient: [name] Note: All result statuses are Final unless otherwise noted.  Tests: (1) Colonoscopy (COL)   COL Colonoscopy           [facility]      [facility]     [address]     [place], [place]  [address]           COLONOSCOPY PROCEDURE REPORT           PATIENT:  Natalie [name], Natalie [name]  MR#:  [id number]     BIRTHDATE:  [birth year], 76 yrs. old  GENDER:  female     ENDOSCOPIST:  Hedwig Morton. Juanda Chance, MD     REF. BY:     PROCEDURE DATE:  12/08/2009     PROCEDURE:  Colonoscopy 332[address]     ASA CLASS:  Class II     INDICATIONS:  Elevated Risk Screening adenomatous polyps x 2 2005,           MEDICATIONS:   Versed 8 mg, Fentanyl 100 mcg           DESCRIPTION OF PROCEDURE:   After the risks benefits and     alternatives of the procedure were thoroughly explained, informed     consent was obtained.  Digital rectal exam was performed and     revealed no rectal masses.   The LB CF-[id number] endoscope     was introduced through the anus and advanced to the cecum, which     was identified by both the appendix and ileocecal valve, without     limitations.  The quality of the prep was adequate, using MiraLax.     The instrument was then slowly withdrawn as the colon was fully     examined.     <<PROCEDUREIMAGES>>           FINDINGS:  A sessile polyp was found in the rectum. frisble soft 1     cm polyp proximal to the dentate line Polyp was snared without     cautery. Retrieval was successful. snare polyp Polyp was snared,     then cauterized with monopolar cautery. Retrieval was successful     (see image13, image12, image11, image10, and image9). snare polyp     submucosal injection residual bleeding from the polypectomy site     1cc of 1: 10,000 Epinephrine injected into the polypectomy site     Internal hemorrhoids were found (see image9).  Moderate     diverticulosis was found in the sigmoid colon (see image7, image2,     and image1).  This was otherwise a  normal examination of the colon     (see image3, image4, and image5).   Retroflexed views in the     rectum revealed no abnormalities.    The scope was then withdrawn     from the patient and the procedure completed.           COMPLICATIONS:  None     ENDOSCOPIC IMPRESSION:     1) Sessile polyp in the rectum     2) Internal hemorrhoids     3) Moderate diverticulosis in the sigmoid colon     4) Otherwise normal examination     s/p Epi injection of postpolypectomy site     RECOMMENDATIONS:     1) Await pathology results     no ASA x 1 week     REPEAT EXAM:  In 5 year(s) for.  ______________________________     Hedwig Morton. Juanda Chance, MD           CC:           n.     eSIGNED:   Hedwig Morton. Roderica Cathell at 12/08/2009 12:14 PM           Page 2 of 3   Failla, Maeser, [id number]  Note: An exclamation mark (!) indicates a result that was not dispersed into the flowsheet. Document Creation Date: 12/08/2009 12:15 PM _______________________________________________________________________  (1) Order result status: Final Collection or observation date-time: 12/08/2009 12:03 Requested date-time:  Receipt date-time:  Reported date-time:  Referring Physician:   Ordering Physician: Lina Sar [phone]) Specimen Source:  Source: Launa Grill Order Number: [id number] Lab site:   Appended Document: Colonoscopy     Procedures Next Due Date:    Colonoscopy: 12/2014

## 2010-06-16 NOTE — Progress Notes (Signed)
Summary: discuss labs  Phone Note Call from Patient Call back at Home Phone [phone]   Caller: Patient Summary of Call: patient was given rx for macrobid - she checked & this is the med she  had a reaction to  - [facility] [place]   Initial call taken by: Okey Regal [place],  May 02, 2008 8:47 AM  Follow-up for Phone Call        left message with husband to have patient return call.........Marland KitchenShary Decamp  May 02, 2008 10:05 AM --add macrobid to allergy list --Will call new Rx w/ UCx results Jose E. Paz MD  May 02, 2008 12:27 PM  advise  patient: tests normal, no further workup needed at this point Signed by [facility] E. Paz MD on 05/03/2008 at 6:30 AM advise patient diabetes stable start simvastatin 40 mg nightly office visit in 3 months, fasting patient to report any side effects Signed by Nolon Rod. Paz MD on 05/03/2008 at 6:32 AM    Additional Follow-up for Phone Call Additional follow up Details #1::        Pt called back, returning call, informed pt  [facility] called to inform her, they will call in a new Rx in place of Macrobid when UCX are back. Pt said she will call [facility] tomorrow in ref to [facility] .Kandice Hams  May 02, 2008 2:32 PM  Additional Follow-up by: Kandice Hams,  May 02, 2008 2:32 PM   New Allergies: ! MACROBID Additional Follow-up for Phone Call Additional follow up Details #2::    advise  patient: tests normal, no further workup needed at this point Signed by [facility] E. Paz MD on 05/03/2008 at 6:30 AM advise patient diabetes stable start simvastatin 40 mg nightly office visit in 3 months, fasting patient to report any side effects Signed by Nolon Rod. Paz MD on 05/03/2008 at 6:32 AM   Additional Follow-up for Phone Call Additional follow up Details #3:: Details for Additional Follow-up Action Taken: advise patient: UCX inconclusive. If still has dysuria: reculture urine, if no bladder symptoms, no further treatment   left message on  machine ..............Marland KitchenDoristine Devoid  May 07, 2008 1:54 PM  spoke with patient about urine cx aware nothing seen says she isnt  having a difficulty going and aware of labs would like to try doing something natural informed pt diet and exercise and fish oil may also help but as far as treating high cholestrol b/c she has tried simvastatin in the past and didn't like the side effects informed her that I would like Dr. Drue Novel know and appt to follow up in 3 months ..........Marland KitchenDoristine Devoid  May 07, 2008 3:06 PM   I really recommend medication, offer a trial w/  pravachol 40mg  once daily ; if declines, then just RTC in 3 months  Jose E. Paz MD  May 07, 2008 3:25 PM   left msg w/ husband to have patient return call .................Marland KitchenDoristine Devoid  May 09, 2008 12:09 PM   spoke with patient aware of recommendation will discuss further at 3 month roa..............Marland KitchenDoristine Devoid  May 13, 2008 9:57 AM   New/Updated Medications: SIMVASTATIN 40 MG TABS (SIMVASTATIN) 1 by mouth at bedtime New Allergies: ! MACROBID

## 2010-06-16 NOTE — Assessment & Plan Note (Signed)
Summary: CPX/TL  Medications Added LORATADINE 10 MG TABS (LORATADINE) Take 1 tablet by mouth once a day LISINOPRIL 10 MG TABS (LISINOPRIL) 1 by mouth qd MOBIC 7.5 MG TABS (MELOXICAM) once daily as needed NASONEX 50 MCG/ACT SUSP (MOMETASONE FUROATE) Spray 2 spray into both nostrils every morning * FISH OIL  * VITAMIN D  MULTIVITAMINS   TABS (MULTIPLE VITAMIN)  * TYLENOL  * MAALOX  * VITAMIN C  * COQ10  * ALLERGY INJECTIONS       Allergies Added: ! SULFA ! CEFTIN ! * CEFZIL ! PCN ! SUPRAX  Vital Signs:  Patient Profile:   77 Years Old Female Weight:      199.4 pounds Pulse rate:   80 / minute Pulse rhythm:   regular BP sitting:   122 / 80  (left arm)  Vitals Entered By: Shary Decamp (November 30, 2006 8:09 AM)               Chief Complaint:  yearly; went to derm with a scalp problem - wants to discuss with Dr. Drue Novel; pt has decreased dosage of diuretic because of increased urination -decreased to 1/2 dose on vacation now back up to 3/4 dose; c/o feet numb.  History of Present Illness: here for her yearly exam multiple questions and concerns  Current Allergies (reviewed today): ! SULFA ! CEFTIN ! * CEFZIL ! PCN ! SUPRAX Updated/Current Medications (including changes made in today's visit):  LORATADINE 10 MG TABS (LORATADINE) Take 1 tablet by mouth once a day LISINOPRIL 10 MG TABS (LISINOPRIL) 1 by mouth qd MOBIC 7.5 MG TABS (MELOXICAM) once daily as needed NASONEX 50 MCG/ACT SUSP (MOMETASONE FUROATE) Spray 2 spray into both nostrils every morning * FISH OIL  * VITAMIN D  MULTIVITAMINS   TABS (MULTIPLE VITAMIN)  * TYLENOL  * MAALOX  * VITAMIN C  * COQ10  * ALLERGY INJECTIONS    Past Medical History:    Hypertension   Social History:    Married    four children   Risk Factors:  Tobacco use:  never Alcohol use:  yes    Comments:  occasional wine   Review of Systems       continued to have edema, wonders if is okay to take Lasix p.r.n. Concern  about the results of a recent CT per urology was diagnosed with folliculitis by her dermatology  prescribed doxycycline ; wonders if she needs another prescription. She takes Mobic for pain, wonders if he is okay to take it p.r.n. Four weeks ago and lasted in segment episode of palpitations without chest pain, shortness of breath, nausea, diaphoresis or near syncope.   Physical Exam  General:     alert, well-developed, and well-nourished.   Neck:     no masses, no thyromegaly, and normal carotid upstroke.   Lungs:     normal respiratory effort, no intercostal retractions, and no accessory muscle use.   Heart:     normal rate, regular rhythm, and no murmur.   Abdomen:     soft, non-tender, no hepatomegaly, and no splenomegaly.   Extremities:     no edema Psych:     not anxious appearing and not depressed appearing.      Impression & Recommendations:  Problem # 1:  HEALTH MAINTENANCE EXAM (ICD-V70.0) yearly Exam today. Chart is review and updated. Female care per gynecology. Needs a bone density test, will schedule. Due for another colonoscopy in October 2008, patient to call GI and let us  know if she needs help scheduling. 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  Plan is to observe  Problem # 6:  ENDOMETRIAL BIOPSY, HX OF  (ICD-V45.89) status post evaluation by gynecology and 2005 4 pubic pain.  Ultrasound showed thickened the major and a CT of the abdomen and pelvis was negative, they perform a biopsy.  Problem # 7:  DISEASE, LUNG, OTHER NEC (ICD-518.89) order a chest x-ray, last x-ray was two years ago Orders: CXR- 2view (CXR)   Problem # 8:  HYPERGLYCEMIA (ICD-790.29) at some point her A1c was 5.7, recheck Orders: TLB-A1C / Hgb A1C (Glycohemoglobin) (83036-A1C)   Problem # 9:  HYPERTENSION (ICD-401.9) stable Her updated medication list for this problem includes:    Lisinopril 10 Mg Tabs (Lisinopril) .Marland Kitchen... 1 by mouth qd  BP today: 122/80  Labs Reviewed: Creat: 0.9 (10/14/2006)   Problem # 10:  time spent w/pt. more than 40 min due to number of issues and questions  Medications Added to Medication List This Visit: 1)  Loratadine 10 Mg Tabs (Loratadine) .... Take 1 tablet by mouth once a day 2)  Lisinopril 10 Mg Tabs (Lisinopril) .Marland Kitchen.. 1 by mouth qd 3)  Mobic 7.5 Mg Tabs (Meloxicam) .... Once daily as needed 4)  Nasonex 50 Mcg/act Susp (Mometasone furoate) .... Spray 2 spray into both nostrils every morning 5)  Fish Oil  6)  Vitamin D  7)  Multivitamins Tabs (Multiple vitamin) 8)  Tylenol  9)  Maalox  10)  Vitamin C  11)  Coq10  12)  Allergy Injections   Other Orders: Radiology Referral (Radiology)     Prescriptions: MOBIC 7.5 MG TABS (MELOXICAM) once daily as needed  #90 x 1   Entered and Authorized by:   Elita Quick E. Paz MD   Signed by:   Nolon Rod. Paz MD on 11/30/2006   Method used:   Print then Give to Patient   RxID:   [id number]       Tetanus/Td Immunization History:    Tetanus/Td # 1:  Historical (11/20/1997)  Pneumovax Immunization History:    Pneumovax # 1:  Historical (04/26/2005)

## 2010-06-16 NOTE — Letter (Signed)
Summary: Miralax Instructions  [facility]  [address]   [place], [place] [address]   Phone: [phone]  Fax: [phone]       Natalie [name]    1932/10/11    MRN: [id number]       Procedure Day Dorna Bloom: Monday, 12-08-09     Arrival Time: 10:00 a.m.     Procedure Time: 11:00 a.m.     Location of Procedure:                    x [facility] (4th Floor)   PREPARATION FOR COLONOSCOPY WITH MIRALAX  Starting 5 days prior to your procedure  12-03-09 do not eat nuts, seeds, popcorn, corn, beans, peas,  salads, or any raw vegetables.  Do not take any fiber supplements (e.g. Metamucil, Citrucel, and Benefiber). ____________________________________________________________________________________________________   THE DAY BEFORE YOUR PROCEDURE         DATE: 12-07-09  DAY: Sunday 1   Drink clear liquids the entire day-NO SOLID FOOD  2   Do not drink anything colored red or purple.  Avoid juices with pulp.  No orange juice.  3   Drink at least 64 oz. (8 glasses) of fluid/clear liquids during the day to prevent dehydration and help the prep work efficiently.  CLEAR LIQUIDS INCLUDE: Water Jello Ice Popsicles Tea (sugar ok, no milk/cream) Powdered fruit flavored drinks Coffee (sugar ok, no milk/cream) Gatorade Juice: apple, white grape, white cranberry  Lemonade Clear bullion, consomm, broth Carbonated beverages (any kind) Strained chicken noodle soup Hard Candy  4   Mix the entire bottle of Miralax with 64 oz. of Gatorade/Powerade in the morning and put in the refrigerator to chill.  5   At 3:00 pm take 2 Dulcolax/Bisacodyl tablets.  6   At 4:30 pm take one Reglan/Metoclopramide tablet.  7  Starting at 5:00 pm drink one 8 oz glass of the Miralax mixture every 15-20 minutes until you have finished drinking the entire 64 oz.  You should finish drinking prep around 7:30 or 8:00 pm.  8   If you are nauseated, you may take the 2nd Reglan/Metoclopramide  tablet at 6:30 pm.        9    At 8:00 pm take 2 more DULCOLAX/Bisacodyl tablets.     THE DAY OF YOUR PROCEDURE      DATE:  12-08-09   DAY: Monday  You may drink clear liquids until 9:00 a.m.  (2 HOURS BEFORE PROCEDURE).   MEDICATION INSTRUCTIONS  Unless otherwise instructed, you should take regular prescription medications with a small sip of water as early as possible the morning of your procedure.    Additional medication instructions: Do not take Lasix day of procedure.         OTHER INSTRUCTIONS  You will need a responsible adult at least 75 years of age to accompany you and drive you home.   This person must remain in the waiting room during your procedure.  Wear loose fitting clothing that is easily removed.  Leave jewelry and other valuables at home.  However, you may wish to bring a book to read or an iPod/MP3 player to listen to music as you wait for your procedure to start.  Remove all body piercing jewelry and leave at home.  Total time from sign-in until discharge is approximately 2-3 hours.  You should go home directly after your procedure and rest.  You can resume normal activities the day after your procedure.  The  day of your procedure you should not:   Drive   Make legal decisions   Operate machinery   Drink alcohol   Return to work  You will receive specific instructions about eating, activities and medications before you leave.   The above instructions have been reviewed and explained to me by   Ezra Sites RN  December 01, 2009 10:33 AM    I fully understand and can verbalize these instructions _____________________________ Date _______

## 2010-06-16 NOTE — Progress Notes (Signed)
Summary: -unable to tolerate meds-lm  Phone Note Call from Patient Call back at Home Phone [phone]   Caller: Patient Summary of Call: patient left msg on voicemail was given samples of trilipix and says she is unable to take given HA and unable to sleep so patient has d/c medication and if Dr. Drue Novel would like to try her anything else Initial call taken by: Doristine Devoid,  October 30, 2009 3:04 PM  Follow-up for Phone Call        left msg w/ female to have patient return call.....Marland KitchenMarland KitchenDoristine Devoid  October 30, 2009 3:05 PM   Additional Follow-up for Phone Call Additional follow up Details #1::        unable to take trilipix Her triglycerides are elevated Plan: Niaspan 500 mg  by mouth at bedtime for 10 days, then increase to 2 at bedtime To prevent flushing she is to take an aspirin 30 minutes ahead of time and have  a  snack with Niaspan. If available give her samples, gave her the card for her to role in the niaspan help line Jose E. Paz MD  November 03, 2009 2:46 PM     Additional Follow-up for Phone Call Additional follow up Details #2::    [facility] OFFICE....................Marland KitchenFelecia Deloach CMA  November 03, 2009 5:08 PM  left message to call  office...Marland KitchenMarland KitchenFelecia Deloach CMA  November 04, 2009 9:43 AM  left message to call  office...............Marland KitchenFelecia Deloach CMA  November 05, 2009 10:00 AM  left message to call office............Marland KitchenFelecia Deloach CMA  November 06, 2009 1:52 PM  DISCUSS WITH PATIENT, samples placed up front................Marland KitchenFelecia Deloach CMA  November 10, 2009 10:04 AM

## 2010-06-16 NOTE — Assessment & Plan Note (Signed)
Summary: roa per refills//lch   Vital Signs:  Patient profile:   77 year old female Height:      66 inches Weight:      203 pounds BMI:     32.88 Pulse rate:   76 / minute BP sitting:   150 / 92  Vitals Entered By: Shary Decamp (Oct 08, 2009 11:02 AM) CC: rov, pt d/c'd pravachol due to myalgias   History of Present Illness: routine office visit (last office visit 09-2008) -Due for a colonoscopy   -HYPERTENSION , occasionally check her BP ?reradings , good medication compliance   -AODM, no ambulatory CBGs   -Hyperlipidemia, self discontinued Pravachol due to myalgias-cramps in the LE    Current Medications (verified): 1)  Furosemide 20 Mg  Tabs (Furosemide) .Marland Kitchen.. 1by Mouth Once Daily 2)  Loratadine 10 Mg Tabs (Loratadine) .... Take 1 Tablet By Mouth Once A Day 3)  Lisinopril 10 Mg Tabs (Lisinopril) .Marland Kitchen.. 1 By Mouth Once Daily - Due Office Visit For Additional Refills 4)  Mobic 7.5 Mg Tabs (Meloxicam) .... Once Daily As Needed 5)  Nasonex 50 Mcg/act Susp (Mometasone Furoate) .... Spray 2 Spray Into Both Nostrils Every Morning 6)  Allergy Injections 7)  Fish Oil 8)  Vitamin D 9)  Multivitamins   Tabs (Multiple Vitamin) 10)  Tylenol 11)  Maalox 12)  Vitamin C 13)  Aspirin Adult Low Strength 81 Mg Tbec (Aspirin) .Marland Kitchen.. 1 A Day 14)  Pravachol 40 Mg Tabs (Pravastatin Sodium) .... Pt D/c'd Due To Myalgias.........Shary Decamp  Oct 08, 2009 11:02 Am  Allergies (verified): 1)  ! Sulfa 2)  ! Ceftin 3)  ! * Cefzil 4)  ! Pcn 5)  ! Suprax 6)  ! Macrobid  Past History:  Past Medical History: Reviewed history from 09/20/2008 and no changes required. HYPERTENSION  AODM  h/o COLONIC POLYPS  FEMORAL BRUIT  HYPERCALCEMIA, HYPERPARATHYROIDISM, PRIMARY Chronic on-off back pain (on mobic as needed) 1989: status post RML lung  lobectomy, biopsy showed precancerous changes 2005: back and pelvic pain, s/p w-u, CT (-), u/s showed a thick endometrium, s/p Bx per gyn Sees urology for   ureteric obstruction Hyperlipidemia  Past Surgical History: Reviewed history from 05/01/2008 and no changes required. 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 Resp:  Denies cough and shortness of breath. GI:  Denies bloody stools, diarrhea, nausea, and vomiting; some bloating, rarely has heartburn.  Physical Exam  General:  alert, well-developed, and overweight-appearing.   Neck:  no masses and no thyromegaly.   Lungs:  normal respiratory effort, no intercostal retractions, and no accessory muscle use.   Heart:  normal rate, regular rhythm, and no murmur.   Extremities:  no lower extremity edema and Psych:  Cognition and judgment appear intact. Alert and cooperative with normal attention span and concentration.  not anxious appearing and not depressed appearing.  good spirits   Impression & Recommendations:  Problem # 1:  HYPERLIPIDEMIA (ICD-272.4) history of Zocor and now Pravachol intolerance Plan: Labs, diet exercise, consider zetia The following medications were removed from the medication list:    Pravachol 40 Mg Tabs (Pravastatin sodium) .Marland Kitchen... Pt d/c'd due to myalgias.........stacia hawks  Oct 08, 2009 11:02 am  Orders: Venipuncture (13244) TLB-Lipid Panel (80061-LIPID) TLB-ALT (SGPT) (84460-ALT) TLB-AST (SGOT) (84450-SGOT)  Problem # 2:  HYPERTENSION (ICD-401.9) Well control? She is on a low dose of lisinopril, increase dose to 20 mg Her updated medication list for this problem includes:    Furosemide 20 Mg Tabs (Furosemide) .Marland KitchenMarland KitchenMarland KitchenMarland Kitchen 1by mouth once daily    Lisinopril 20 Mg Tabs (Lisinopril) ..... One by mouth daily  BP today: 150/92 Prior BP: 140/76 (09/20/2008)  Labs Reviewed: K+: 4.3 (05/01/2008) Creat: : 0.7  (05/01/2008)   Chol: 260 (09/20/2008)   HDL: 37.80 (09/20/2008)   LDL: DEL (05/01/2008)   TG: 304.0 (09/20/2008)  Orders: TLB-BMP (Basic Metabolic Panel-BMET) (80048-METABOL) TLB-CBC Platelet - w/Differential (85025-CBCD)  Problem # 3:  AODM (ICD-250.00) diet-controlled, labs, information provided about that an exercise Her updated medication list for this problem includes:    Lisinopril 20 Mg Tabs (Lisinopril) ..... One by mouth daily    Aspirin Adult Low Strength 81 Mg Tbec (Aspirin) .Marland Kitchen... 1 a day  Orders: TLB-A1C / Hgb A1C (Glycohemoglobin) (83036-A1C) TLB-Microalbumin/Creat Ratio, Urine (82043-MALB)  Problem # 4:  HEALTH MAINTENANCE EXAM (ICD-V70.0) due for a colonoscopy, patient aware, provided  phone number for  GI bone density test 8-10 normal Last mammogram 5-10 normal Encouraged her to come back in 4 months for a yearly checkup  Complete Medication List: 1)  Furosemide 20 Mg Tabs (Furosemide) .Marland Kitchen.. 1by mouth once daily 2)  Loratadine 10 Mg Tabs (Loratadine) .... Take 1 tablet by mouth once a day 3)  Lisinopril 20 Mg Tabs (Lisinopril) .... One by mouth daily 4)  Mobic 7.5 Mg Tabs (Meloxicam) .... Once daily as needed 5)  Nasonex 50 Mcg/act Susp (Mometasone furoate) .... Spray 2 spray into both nostrils every morning 6)  Allergy Injections  7)  Fish Oil  8)  Vitamin D  9)  Multivitamins Tabs (Multiple vitamin) 10)  Tylenol  11)  Maalox  12)  Vitamin C  13)  Aspirin Adult Low Strength 81 Mg Tbec (Aspirin) .Marland Kitchen.. 1 a day  Patient Instructions: 1)  please call [facility], you are due for a colonoscopy with Dr. Westley Gambles. Their number is [phone] 2)  Please schedule a follow-up appointment in 4 months  (fasting, yearly check up) 3)  Increases lisinopril  to 20 mg Prescriptions: NASONEX 50 MCG/ACT SUSP (MOMETASONE FUROATE) Spray 2 spray into both nostrils every morning  #1 x 6   Entered by:   Shary Decamp   Authorized by:   Nolon Rod. Francisco Eyerly MD   Signed by:   Shary Decamp on  10/08/2009   Method used:   Electronically to        [facility]. [phone]* (retail)       [phone]       [place], [place]  [address]       Ph: [phone]       Fax: [phone]   RxID:   [id number] MOBIC 7.5 MG TABS (MELOXICAM) once daily as needed  #30 x 1   Entered by:   Shary Decamp   Authorized by:   Nolon Rod. Lashia Niese MD   Signed by:   Shary Decamp on 10/08/2009   Method used:   Electronically to        [facility]. [phone]* (retail)       [phone]       [place], [place]  [address]       Ph: [phone]  Fax: [phone]   RxID:   [id number] LISINOPRIL 20 MG TABS (LISINOPRIL) one by mouth daily  #90 x 1   Entered by:   Shary Decamp   Authorized by:   Nolon Rod. Toshi Ishii MD   Signed by:   Shary Decamp on 10/08/2009   Method used:   Electronically to        [facility]. [phone]* (retail)       [phone]       [place], [place]  [address]       Ph: [phone]       Fax: [phone]   RxID:   [id number] FUROSEMIDE 20 MG  TABS (FUROSEMIDE) 1by mouth once daily  #90 Tablet x 1   Entered by:   Shary Decamp   Authorized by:   Nolon Rod. Halen Mossbarger MD   Signed by:   Shary Decamp on 10/08/2009   Method used:   Electronically to        [facility]. [phone]* (retail)       [phone]       [place], [place]  [address]       Ph: [phone]       Fax: [phone]   RxID:   [id number]

## 2010-06-16 NOTE — Progress Notes (Signed)
Summary: [facility] 5/27  Phone Note Outgoing Call Call back at Home Phone [phone]   Reason for Call: Discuss lab or test results Details for Reason: advised patient Her diabetes continued to be well-controlled her cholesterol continued to be elevated, particularly her triglycerides recommend to start Trilipix 135mg  once daily, call 30 x 3. okay to provide samples to be sure she tolerates it although this medicine  usually does not cause muscle aches FLP, AST, ALT in 6 weeks   Signed by Elita Quick E. Paz MD on 10/10/2009 at 4:06 PM Summary of Call: left message with husband to have pt return call................ Shary Decamp  Oct 10, 2009 4:22 PM   Follow-up for Phone Call        pt informed of labwork, samples of Trilipix upfront for pt, rx called to [facility] on [address]. Pt request a 90 day supply of Mobic it is cheaper. Kandice Hams  Oct 14, 2009 9:08 AM  Follow-up by: Kandice Hams,  Oct 14, 2009 9:08 AM    New/Updated Medications: TRILIPIX 135 MG CPDR (CHOLINE FENOFIBRATE) 1 by mouth once daily Prescriptions: MOBIC 7.5 MG TABS (MELOXICAM) once daily as needed  #90 x 0   Entered by:   Kandice Hams   Authorized by:   Nolon Rod. Paz MD   Signed by:   Kandice Hams on 10/14/2009   Method used:   Re-Faxed to ...       [facility]. [phone]* (retail)       [phone] [address]       [place], [place]  [address]       Ph: [phone]       Fax: [phone]   RxID:   [id number] TRILIPIX 135 MG CPDR (CHOLINE FENOFIBRATE) 1 by mouth once daily  #30 x 1   Entered by:   Kandice Hams   Authorized by:   Nolon Rod. Paz MD   Signed by:   Kandice Hams on 10/14/2009   Method used:   Faxed to ...       [facility] [phone]* (retail)       [phone]       [place], [place]  [address]       Ph: [phone]       Fax: [phone]   RxID:   [id number]

## 2010-06-16 NOTE — Letter (Signed)
Summary: Primary Care Appointment Letter  [facility]  [address] [place], [place] [address]   Phone: [phone]  Fax: [phone]    09/08/2007 MRN: [id number]  Natalie [name] [address] [place], [place]  [address]  Dear Ms. Reek,   Your Primary Care Physician  has indicated that:    ____X___it is time to schedule an appointment.  .We received a call from your pharmacy requesting a refill for your medications. We have refilled your medication(s) for 1 month(s).  Please call our office @ [phone] and speak to our scheduler to schedule an office visit before your refill(s) run out.    _______you missed your appointment on______ and need to call and          reschedule.    _______you need to have lab work done.    _______you need to schedule an appointment discuss lab or test results.    _______you need to call to reschedule your appointment that is                       scheduled on _________.     Please call our office as soon as possible. Our phone number is [phone]          _________. Please press option 1. Our office is open 8a-12noon and 1p-5p, Monday through Friday.     Thank you,    [facility]

## 2010-06-16 NOTE — Procedures (Signed)
Summary: Gastroenterology-COLONOSCOPY  Gastroenterology-COLONOSCOPY   Imported By: Freddy Jaksch 12/08/2006 09:23:57  _____________________________________________________________________  External Attachment:    Type:   Image     Comment:   COLONOSCOPY

## 2010-06-16 NOTE — Assessment & Plan Note (Signed)
Summary: FOLLOW UP PAST DUE/RH.....   Vital Signs:  Patient profile:   77 year old female Height:      66 inches Weight:      199 pounds BMI:     32.24 Pulse rate:   70 / minute Pulse rhythm:   regular BP sitting:   140 / 76  (left arm) Cuff size:   large  Vitals Entered By: Shary Decamp (Sep 20, 2008 9:20 AM) Comments rov .Marland KitchenShary Decamp  Sep 20, 2008 9:27 AM    History of Present Illness: ROV HYPERTENSION -- ambulatory BPs @ drug store usually 120s  AODM-- does not have a glucometer  Hyperlipidemia-- did not start simvastatin b/c in the past she tried zocor and did not like how she felt although had no myalgias s/p nutritionist counseling, eating better   Current Medications (verified): 1)  Furosemide 20 Mg  Tabs (Furosemide) .Marland Kitchen.. 1by Mouth Once Daily 2)  Loratadine 10 Mg Tabs (Loratadine) .... Take 1 Tablet By Mouth Once A Day 3)  Lisinopril 10 Mg Tabs (Lisinopril) .Marland Kitchen.. 1 By Mouth Qd 4)  Mobic 7.5 Mg Tabs (Meloxicam) .... Once Daily As Needed 5)  Nasonex 50 Mcg/act Susp (Mometasone Furoate) .... Spray 2 Spray Into Both Nostrils Every Morning 6)  Allergy Injections 7)  Fish Oil 8)  Vitamin D 9)  Multivitamins   Tabs (Multiple Vitamin) 10)  Tylenol 11)  Maalox 12)  Vitamin C 13)  Aspirin Adult Low Strength 81 Mg Tbec (Aspirin) .Marland Kitchen.. 1 A Day 14)  Simvastatin 40 Mg Tabs (Simvastatin) .Marland Kitchen.. 1 By Mouth At Bedtime  Allergies (verified): 1)  ! Sulfa 2)  ! Ceftin 3)  ! * Cefzil 4)  ! Pcn 5)  ! Suprax 6)  ! Macrobid  Past History:  Past Medical History:    HYPERTENSION     AODM     h/o COLONIC POLYPS     FEMORAL BRUIT     HYPERCALCEMIA, HYPERPARATHYROIDISM, PRIMARY    Chronic on-off back pain (on mobic as needed)    1989: status post RML lung  lobectomy, biopsy showed precancerous changes    2005: back and pelvic pain, s/p w-u, CT (-), u/s showed a thick endometrium, s/p Bx per gyn    Sees urology for  ureteric obstruction    Hyperlipidemia  Past Surgical  History:    Reviewed history from 05/01/2008 and no changes required:    RML lobectomy     1989  thyroid cystectomy,    Colon polypectomy  Social History:    Reviewed history from 05/01/2008 and no changes required:       Married       four children       Retired, Lawyer   Review of Systems CV:  Denies chest pain or discomfort; occ swelling in her feet "in hot days". Resp:  Denies cough and shortness of breath. MS:  had LBP x 3 weeks, had to stop exercise but now is better . Endo:  allergies acting up lately, sees Dr Belmar Callas ; symptoms better lately .  Physical Exam  General:  alert and well-developed.   Neck:  no thyromegaly.   Lungs:  normal respiratory effort, no intercostal retractions, and no accessory muscle use.   Heart:  normal rate, regular rhythm, and no murmur.   Extremities:  no pretibial edema bilaterally today   Impression & Recommendations:  Problem # 1:  HYPERLIPIDEMIA (ICD-272.4) did not start simvastatin b/c in the past she  tried zocor and did not like how she felt although had no myalgias previous results d/w patient, risk of uncontrolled cholesterol discussed as well consider pravachol The following medications were removed from the medication list:    Simvastatin 40 Mg Tabs (Simvastatin) .Marland Kitchen... 1 by mouth at bedtime  Orders: Venipuncture ([address]) TLB-Lipid Panel (80061-LIPID)  Problem # 2:  HYPERTENSION (ICD-401.9) stable, no change  Her updated medication list for this problem includes:    Furosemide 20 Mg Tabs (Furosemide) .Marland KitchenMarland KitchenMarland KitchenMarland Kitchen 1by mouth once daily    Lisinopril 10 Mg Tabs (Lisinopril) .Marland Kitchen... 1 by mouth qd  BP today: 140/76 Prior BP: 110/64 (05/01/2008)  Labs Reviewed: K+: 4.3 (05/01/2008) Creat: : 0.7 (05/01/2008)   Chol: 281 (05/01/2008)   HDL: 42.2 (05/01/2008)   LDL: DEL (05/01/2008)   TG: 392 (05/01/2008)  Problem # 3:  AODM (ICD-250.00) at goal , labs eating better after she saw the nutritionist  rec: eye exam  Her updated  medication list for this problem includes:    Lisinopril 10 Mg Tabs (Lisinopril) .Marland Kitchen... 1 by mouth qd    Aspirin Adult Low Strength 81 Mg Tbec (Aspirin) .Marland Kitchen... 1 a day  Orders: TLB-A1C / Hgb A1C (Glycohemoglobin) (83036-A1C)  Labs Reviewed: Creat: 0.7 (05/01/2008)    Reviewed HgBA1c results: 6.7 (05/01/2008)  6.6 (11/30/2006)  Complete Medication List: 1)  Furosemide 20 Mg Tabs (Furosemide) .Marland Kitchen.. 1by mouth once daily 2)  Loratadine 10 Mg Tabs (Loratadine) .... Take 1 tablet by mouth once a day 3)  Lisinopril 10 Mg Tabs (Lisinopril) .Marland Kitchen.. 1 by mouth qd 4)  Mobic 7.5 Mg Tabs (Meloxicam) .... Once daily as needed 5)  Nasonex 50 Mcg/act Susp (Mometasone furoate) .... Spray 2 spray into both nostrils every morning 6)  Allergy Injections  7)  Fish Oil  8)  Vitamin D  9)  Multivitamins Tabs (Multiple vitamin) 10)  Tylenol  11)  Maalox  12)  Vitamin C  13)  Aspirin Adult Low Strength 81 Mg Tbec (Aspirin) .Marland Kitchen.. 1 a day  Patient Instructions: 1)  Please schedule a follow-up appointment in 3 months .  Prescriptions: MOBIC 7.5 MG TABS (MELOXICAM) once daily as needed  #90 x 3   Entered and Authorized by:   Elita Quick E. Paz MD   Signed by:   Nolon Rod. Paz MD on 09/20/2008   Method used:   Electronically to        [facility]. [phone]* (retail)       [phone] [address]       [place], [place]  [address]       Ph: [phone]       Fax: [phone]   RxID:   [id number] LISINOPRIL 10 MG TABS (LISINOPRIL) 1 by mouth qd  #90 x 3   Entered and Authorized by:   Nolon Rod. Paz MD   Signed by:   Nolon Rod. Paz MD on 09/20/2008   Method used:   Electronically to        [facility]. [phone]* (retail)       [phone] [address]       [place], [place]  [address]       Ph: [phone]       Fax: [phone]   RxID:   [id number] NASONEX 50 MCG/ACT SUSP (MOMETASONE FUROATE) Spray 2 spray into both nostrils every morning  #1 x 12   Entered  by:  Shary Decamp   Authorized by:   Nolon Rod. Paz MD   Signed by:   Shary Decamp on 09/20/2008   Method used:   Electronically to        [facility]. [phone]* (retail)       [phone] [address]       [place], [place]  [address]       Ph: [phone]       Fax: [phone]   RxID:   [id number]

## 2010-06-16 NOTE — Letter (Signed)
Summary: [facility]  [facility]   Imported By: Lanelle Bal 06/25/2008 12:22:15  _____________________________________________________________________  External Attachment:    Type:   Image     Comment:   External Document

## 2010-06-16 NOTE — Assessment & Plan Note (Signed)
Summary: EST MEDIC//YEARLY/PH   Vital Signs:  Patient Profile:   77 Years Old Female Weight:      200.6 pounds Pulse rate:   76 / minute Pulse rhythm:   regular BP sitting:   110 / 64  (left arm) Cuff size:   large  Vitals Entered By: Shary Decamp (May 01, 2008 1:25 PM)                 Chief Complaint:  yearly - fasting; pt has gyn; c/o of dysuria; discuss furosemide.  History of Present Illness: yearly - fasting;pt has gyn;   c/o of dysuria x few days, no F, no gross hematuria discuss furosemide-- was Rx for edema, still has some swelling  still LBP on-off  HYPERTENSION --no ambulatory BPs HYPERGLYCEMIA-- compared to last year, patient has improved diet       Current Allergies (reviewed today): ! SULFA ! CEFTIN ! * CEFZIL ! PCN ! SUPRAX  Past Medical History:    HYPERTENSION     HYPERGLYCEMIA    h/o COLONIC POLYPS     FEMORAL BRUIT     HYPERCALCEMIA, HYPERPARATHYROIDISM, PRIMARY    Chronic on-off back pain (on mobic as needed)    1989: status post RML lung  lobectomy, biopsy showed precancerous changes    2005: back and pelvic pain, s/p w-u, CT (-), u/s showed a thick endometrium, s/p Bx per gyn    Sees urology for  ureteric obstruction    Hyperlipidemia  Past Surgical History:    Reviewed history from 10/11/2007 and no changes required:       RML lobectomy        1989  thyroid cystectomy,       Colon polypectomy   Family History:    Family History of Arthritis    Diabetes-- B x 2     hypertension-- B, M     brain tumor--M    breast Ca--no    colon ca--no  Social History:    Married    four children    Retired, Lawyer    Risk Factors: Tobacco use:  never Alcohol use:  yes  Mammogram History:    Date of Last Mammogram:  07/27/2007  Colonoscopy History:     Date of Last Colonoscopy:  02/20/2004    Results:  diverticulosis; polyps    Review of Systems  CV      Denies chest pain or discomfort, palpitations, and  shortness of breath with exertion.  Resp      Denies cough and wheezing.  GI      Denies bloody stools and diarrhea.  GU      Denies hematuria and urinary hesitancy.  Psych      lots of stress    Physical Exam  General:     alert and well-developed.   Neck:     no thyromegaly and normal carotid upstroke.   Lungs:     normal respiratory effort, no intercostal retractions, and no accessory muscle use.   Heart:     normal rate, regular rhythm, and no murmur.   Abdomen:     soft, non-tender, no distention, no masses, no hepatomegaly, and no splenomegaly.   Extremities:     trace B pretibial edema  Neurologic:     alert & oriented X3 and gait normal.   Psych:     Cognition and judgment appear intact. Alert and cooperative with normal attention span and concentration.     Impression &  Recommendations:  Problem # 1:  UTI (ICD-599.0) Assessment: New see allergies start macrobid Her updated medication list for this problem includes:    Macrobid 100 Mg Caps (Nitrofurantoin monohyd macro) .Marland Kitchen... 1 by mouth bid   Problem # 2:  HEALTH MAINTENANCE EXAM (ICD-V70.0) Chart is review and updated. Female care per gynecology. DEXA 11-2006, normaL, REPEAT IN 2 OR 3 YEARS  Due for another colonoscopy in October 2008, thus is not update: GI referal (Dr Westley Gambles) Last pneumonia shot in 2006 Td today printed material provided regards shingles shot  had flu shot already Orders: Gastroenterology Referral (GI)   Problem # 3:  AODM (ICD-250.00) printed material provided regards A1C, diet, exercise offered referal to nutritionist-- will do  d/w patient risk of DM Her updated medication list for this problem includes:    Lisinopril 10 Mg Tabs (Lisinopril) .Marland Kitchen... 1 by mouth qd    Aspirin Adult Low Strength 81 Mg Tbec (Aspirin) .Marland Kitchen... 1 a day  Orders: TLB-A1C / Hgb A1C (Glycohemoglobin) (83036-A1C) TLB-Microalbumin/Creat Ratio, Urine (82043-MALB) Nutrition Referral (Nutrition)  Labs  Reviewed: HgBA1c: 6.6 (11/30/2006)   Creat: 0.8 (11/30/2006)      Problem # 4:  HYPERLIPIDEMIA (ICD-272.4) diet exercise likely will need meds  Orders: Venipuncture ([address]) TLB-ALT (SGPT) (84460-ALT) TLB-AST (SGOT) (84450-SGOT) TLB-Lipid Panel (80061-LIPID)  Labs Reviewed: Chol: 240 (11/30/2006)   HDL: 37.2 (11/30/2006)   LDL: 158.3 (11/30/2006)   TG: 399 (11/30/2006) SGOT: 20 (10/14/2006)   SGPT: 18 (10/14/2006)   Problem # 5:  HYPERTENSION (ICD-401.9) increase lasix to 1 day  Her updated medication list for this problem includes:    Furosemide 20 Mg Tabs (Furosemide) .Marland KitchenMarland KitchenMarland KitchenMarland Kitchen 1by mouth once daily    Lisinopril 10 Mg Tabs (Lisinopril) .Marland Kitchen... 1 by mouth qd  Orders: TLB-BMP (Basic Metabolic Panel-BMET) (80048-METABOL) TLB-Hemoglobin (Hgb) (85018-HGB)  BP today: 110/64 Prior BP: 118/80 (10/11/2007)  Labs Reviewed: Creat: 0.8 (11/30/2006) Chol: 240 (11/30/2006)   HDL: 37.2 (11/30/2006)   LDL: 158.3 (11/30/2006)   TG: 399 (11/30/2006)   Problem # 6:  HYPERCALCEMIA (ICD-275.42) h/o hypercalcemia per chart review check PTH Orders: T- * Misc. Laboratory test [phone])   Problem # 7:  F2F 45 min plus OV level 5  due to chart review, patient education, # of issues adressed  , referals   Complete Medication List: 1)  Furosemide 20 Mg Tabs (Furosemide) .Marland Kitchen.. 1by mouth once daily 2)  Loratadine 10 Mg Tabs (Loratadine) .... Take 1 tablet by mouth once a day 3)  Lisinopril 10 Mg Tabs (Lisinopril) .Marland Kitchen.. 1 by mouth qd 4)  Mobic 7.5 Mg Tabs (Meloxicam) .... Once daily as needed 5)  Nasonex 50 Mcg/act Susp (Mometasone furoate) .... Spray 2 spray into both nostrils every morning 6)  Allergy Injections  7)  Fish Oil  8)  Vitamin D  9)  Multivitamins Tabs (Multiple vitamin) 10)  Tylenol  11)  Maalox  12)  Vitamin C  13)  Aspirin Adult Low Strength 81 Mg Tbec (Aspirin) .Marland Kitchen.. 1 a day 14)  Macrobid 100 Mg Caps (Nitrofurantoin monohyd macro) .Marland Kitchen.. 1 by mouth bid  Other Orders: UA Dipstick  w/o Micro (manual) (09811) T-Urine Microscopic (91478-29562) T-Culture, Urine (13086-57846) Tetanus Toxoid w/Dx (96295) Admin 1st Vaccine (28413) TLB-TSH (Thyroid Stimulating Hormone) (24401-UUV)   Patient Instructions: 1)  Please schedule a follow-up appointment in 3 months.   Prescriptions: MACROBID 100 MG CAPS (NITROFURANTOIN MONOHYD MACRO) 1 by mouth bid  #20 x 0   Entered by:   Shary Decamp   Authorized by:  Jose E. Paz MD   Signed by:   Shary Decamp on 05/01/2008   Method used:   Electronically to        [facility]. [phone]* (retail)       [phone] [address]       [place], [place]  [address]       Ph: [phone]       Fax: [phone]   RxID:   [id number] FUROSEMIDE 20 MG  TABS (FUROSEMIDE) 1by mouth once daily  #90 x 3   Entered by:   Shary Decamp   Authorized by:   Nolon Rod. Paz MD   Signed by:   Shary Decamp on 05/01/2008   Method used:   Print then Give to Patient   RxID:   [id number]  ]  Tetanus/Td Vaccine    Vaccine Type: Td    Site: left deltoid    Dose: 0.5 ml    Route: IM    Given by: Shary Decamp    Exp. Date: 07/30/2009    Lot #: [id number]     Preventive Care Screening  Colonoscopy:    Date:  02/20/2004    Next Due:  02/2007    Results:  diverticulosis; polyps    Laboratory Results   Urine Tests    Routine Urinalysis   Glucose: negative   (Normal Range: Negative) Bilirubin: negative   (Normal Range: Negative) Ketone: negative   (Normal Range: Negative) Spec. Gravity: 1.015   (Normal Range: 1.003-1.035) Blood: moderate   (Normal Range: Negative) pH: 5.0   (Normal Range: 5.0-8.0) Protein: negative   (Normal Range: Negative) Urobilinogen: 0.2   (Normal Range: 0-1) Nitrite: negative   (Normal Range: Negative) Leukocyte Esterace: moderate   (Normal Range: Negative)        Appended Document: EST MEDIC//YEARLY/PH per our [facility] patient is not due for a colon until 10---2010

## 2010-06-16 NOTE — Miscellaneous (Signed)
Summary: mammo   Clinical Lists Changes  Observations: Added new observation of MAMMOGRAM: normal (01/13/2010 8:22)      Preventive Care Screening  Mammogram:    Date:  01/13/2010    Results:  normal

## 2010-06-16 NOTE — Progress Notes (Signed)
Summary: myalgia  Phone Note Call from Patient Call back at [facility] Phone [phone]   Summary of Call: Patient left message on VM -- c/o of myalgias & jaw pain since she started pravachol...Marland Kitchen pt d/c'd it.  She wants to know if she can take grape seed extract or do you want her to try anything else? Shary Decamp  October 31, 2008 10:29 AM   Follow-up for Phone Call        hold x 2 weeks, then re-start ; if symptoms resurface call for alternatives Jose E. Paz MD  October 31, 2008 9:45 PM   discussed with pt Shary Decamp  November 01, 2008 8:41 AM

## 2010-06-16 NOTE — Letter (Signed)
Summary: Recall Colonoscopy Letter  [facility]  [address] [place], [place] [address]   Phone: [phone]  Fax: [phone]      January 28, 2009 MRN: [id number]   [facility] [address] [place], [place]  [address]   Dear Ms. Verdone,   According to your medical record, it is time for you to schedule a Colonoscopy. The American Cancer Society recommends this procedure as a method to detect early colon cancer. Patients with a family history of colon cancer, or a personal history of colon polyps or inflammatory bowel disease are at increased risk.  This letter has beeen generated based on the recommendations made at the time of your procedure. If you feel that in your particular situation this may no longer apply, please contact our office.  Please call our office at [phone] to schedule this appointment or to update your records at your earliest convenience.  Thank you for cooperating with [place] to provide you with the very best care possible.   Sincerely,  Hedwig Morton. Juanda Chance, M.D.  [facility] [phone]

## 2010-06-16 NOTE — Assessment & Plan Note (Signed)
Summary: acute for fmed refill--PH   Vital Signs:  Patient Profile:   77 Years Old Female Weight:      206.2 pounds Pulse rate:   86 / minute BP sitting:   118 / 80  Vitals Entered By: Shary Decamp (Oct 11, 2007 10:59 AM)             Comments -needs to discuss mobic -med refill -patient states that she pulled something in her lower back 10/08...she went to therapy for a few months.  Pain is better but she still has pain if she "over does it" -c/o left eye +mucus d/c +red x 1 week -- using OTC gtts which have helped some ...........Marland KitchenShary Decamp  Oct 11, 2007 10:58 AM      Chief Complaint:  Multiple medical problems or concerns -- see comments.  History of Present Illness: ACUTE OV -med refill -patient states that she pulled something in her lower back 10/08...she went to therapy for a few months.  Pain is better but she still has pain if she "over does it" f/u by Ortho... they rec.  for [place] to RF Mobic -c/o B eye +mucus d/c +red x 1 week -- using OTC gtts which have helped some    Updated Prior Medication List: LORATADINE 10 MG TABS (LORATADINE) Take 1 tablet by mouth once a day LISINOPRIL 10 MG TABS (LISINOPRIL) 1 by mouth qd MOBIC 7.5 MG TABS (MELOXICAM) once daily as needed NASONEX 50 MCG/ACT SUSP (MOMETASONE FUROATE) Spray 2 spray into both nostrils every morning * FISH OIL  * VITAMIN D  MULTIVITAMINS   TABS (MULTIPLE VITAMIN)  * TYLENOL  * MAALOX  * VITAMIN C  * ALLERGY INJECTIONS  FUROSEMIDE 20 MG  TABS (FUROSEMIDE) 1/2 by mouth qd  Current Allergies (reviewed today): ! SULFA ! CEFTIN ! * CEFZIL ! PCN ! SUPRAX  Past Medical History:    Reviewed history from 11/30/2006 and no changes required:       HYPERTENSION (ICD-401.9)       HYPERGLYCEMIA (ICD-790.29)       h/o COLONIC POLYPS (ICD-211.3)       FEMORAL BRUIT (ICD-785.9)       HYPERCALCEMIA (ICD-275.42)       HYPERPARATHYROIDISM, PRIMARY (ICD-252.01)         Past Surgical History:    Reviewed  history from 09/07/2006 and no changes required:       rml lobectomy        1989  thyroid cystectomy,       Colon polypectomy    Risk Factors: Tobacco use:  never Alcohol use:  yes  Mammogram History:    Date of Last Mammogram:  07/27/2007   Review of Systems       no itchy eyes no RNor itchy nose  Neuro      no LE paresthesias (occ feet numbness even before she hurt her back) no b/b incontinence   Physical Exam  General:     alert and well-developed.   Ears:     EOMI PERLA, ant chamber normal conjuntiva mod red, no d/c Lungs:     normal respiratory effort, no intercostal retractions, and no accessory muscle use.   Heart:     normal rate, regular rhythm, and no murmur.      Impression & Recommendations:  Problem # 1:  BACK PAIN (ICD-724.5) ok to RF mobic , no GI s/e  rec. to talk w/ Ortho regards options thinks that PT is helping to a  point that she can live w/ the pain Her updated medication list for this problem includes:    Mobic 7.5 Mg Tabs (Meloxicam) ..... Once daily as needed   Problem # 2:  HYPERTENSION (ICD-401.9) Assessment: Unchanged  Her updated medication list for this problem includes:    Furosemide 20 Mg Tabs (Furosemide) .Marland Kitchen... 1/2 by mouth qd    Lisinopril 10 Mg Tabs (Lisinopril) .Marland Kitchen... 1 by mouth qd  BP today: 118/80 Prior BP: 122/80 (11/30/2006)  Labs Reviewed: Creat: 0.8 (11/30/2006) Chol: 240 (11/30/2006)   HDL: 37.2 (11/30/2006)   LDL: DEL (11/30/2006)   TG: 399 (11/30/2006)   Problem # 3:  CONJUNCTIVITIS (ICD-372.30) infectious Vs. Allergic see instructions and Rx Her updated medication list for this problem includes:    Gentamicin Sulfate 0.3 % Soln (Gentamicin sulfate) .Marland Kitchen... 2 gtts on each eye evry 4 hours x 5 days   Complete Medication List: 1)  Furosemide 20 Mg Tabs (Furosemide) .... 1/2 by mouth qd 2)  Loratadine 10 Mg Tabs (Loratadine) .... Take 1 tablet by mouth once a day 3)  Lisinopril 10 Mg Tabs (Lisinopril) .Marland Kitchen..  1 by mouth qd 4)  Mobic 7.5 Mg Tabs (Meloxicam) .... Once daily as needed 5)  Nasonex 50 Mcg/act Susp (Mometasone furoate) .... Spray 2 spray into both nostrils every morning 6)  Allergy Injections  7)  Fish Oil  8)  Vitamin D  9)  Multivitamins Tabs (Multiple vitamin) 10)  Tylenol  11)  Maalox  12)  Vitamin C  13)  Gentamicin Sulfate 0.3 % Soln (Gentamicin sulfate) .... 2 gtts on each eye evry 4 hours x 5 days   Patient Instructions: 1)  cold compress to eyes two times a day  2)  eye drops as prescribed 3)  call if worse 4)  Please schedule a follow-up appointment in 3 to 4 months (yearly-fasting)   Prescriptions: MOBIC 7.5 MG TABS (MELOXICAM) once daily as needed  #30 x 6   Entered and Authorized by:   Elita Quick E. Paz MD   Signed by:   Nolon Rod. Paz MD on 10/11/2007   Method used:   Print then Give to Patient   RxID:   [id number] LISINOPRIL 10 MG TABS (LISINOPRIL) 1 by mouth qd  #30 x 6   Entered and Authorized by:   Nolon Rod. Paz MD   Signed by:   Nolon Rod. Paz MD on 10/11/2007   Method used:   Print then Give to Patient   RxID:   [id number] FUROSEMIDE 20 MG  TABS (FUROSEMIDE) 1/2 by mouth qd  #30 x 6   Entered and Authorized by:   Nolon Rod. Paz MD   Signed by:   Nolon Rod. Paz MD on 10/11/2007   Method used:   Print then Give to Patient   RxID:   [id number] GENTAMICIN SULFATE 0.3 %  SOLN (GENTAMICIN SULFATE) 2 gtts on each eye evry 4 hours x 5 days  #1 x 0   Entered and Authorized by:   Nolon Rod. Paz MD   Signed by:   Nolon Rod. Paz MD on 10/11/2007   Method used:   Print then Give to Patient   RxID:   [id number]  ]

## 2010-06-16 NOTE — Letter (Signed)
Summary: Patient Notice- Polyp Results  [facility]  [address] [place], [place] [address]   Phone: [phone]  Fax: [phone]        December 09, 2009 MRN: [id number]    [facility] [address] [place], [place]  [address]    Dear [name],  I am pleased to inform you that the colon polyp(s) removed during your recent colonoscopy was (were) found to be benign (no cancer detected) upon pathologic examination.The polyp is adenomatous ( precancerous)  I recommend you have a repeat colonoscopy examination in 5_ years to look for recurrent polyps, as having colon polyps increases your risk for having recurrent polyps or even colon cancer in the future.  Should you develop new or worsening symptoms of abdominal pain, bowel habit changes or bleeding from the rectum or bowels, please schedule an evaluation with either your primary care physician or with me.  Additional information/recommendations:  _x_ No further action with gastroenterology is needed at this time. Please      follow-up with your primary care physician for your other healthcare      needs.  __ Please call [phone] to schedule a return visit to review your      situation.  __ Please keep your follow-up visit as already scheduled.  __ Continue treatment plan as outlined the day of your exam.  Please call us if you are having persistent problems or have questions about your condition that have not been fully answered at this time.  Sincerely,  Hart Carwin MD  This letter has been electronically signed by your physician.  Appended Document: Patient Notice- Polyp Results letter mailed

## 2010-06-16 NOTE — Letter (Signed)
Summary: Results Follow up Letter  [facility]  [address] [place], [place] [address]   Phone: [phone]  Fax: [phone]    08/02/2007 MRN: [id number]  [name] [address] [place], [place]  [address]  Dear Ms. Borel,  The following are the results of your recent test(s):  Test         Result    Pap Smear:        Normal _____  Not Normal _____ Comments: ______________________________________________________ Cholesterol: LDL(Bad cholesterol):         Your goal is less than:         HDL (Good cholesterol):       Your goal is more than: Comments:  ______________________________________________________ Mammogram:        Normal __x___  Not Normal _____ Comments:  ___________________________________________________________________ Hemoccult:        Normal _____  Not normal _______ Comments:    _____________________________________________________________________ Other Tests:    We routinely do not discuss normal results over the telephone.  If you desire a copy of the results, or you have any questions about this information we can discuss them at your next office visit.   Sincerely,

## 2010-06-16 NOTE — Miscellaneous (Signed)
Summary: BONE DENSITY  Clinical Lists Changes  Orders: Added new Test order of T-Bone Densitometry (77080) - Signed Added new Test order of T-Lumbar Vertebral Assessment (77082) - Signed 

## 2010-06-18 NOTE — Assessment & Plan Note (Signed)
Summary: 4 MONTH OV and fasting///PH   Vital Signs:  Patient profile:   77 year old female Height:      66 inches (167.64 cm) Weight:      202.38 pounds (91.99 kg) BMI:     32.78 Temp:     98.8 degrees F (37.11 degrees C) oral BP sitting:   140 / 82  (left arm)  Vitals Entered By: Lucious Groves CMA (June 10, 2010 8:43 AM) CC: 4 mo rtn ov and pt is fasting./kb Is Patient Diabetic? No Pain Assessment Patient in pain? no        History of Present Illness: followup from previous visit, her triglycerides were elevated, she was prescribed Lopid, took it for one week, self discontinued due to headaches  Current Medications (verified): 1)  Furosemide 20 Mg  Tabs (Furosemide) .Marland Kitchen.. 1by Mouth Once Daily 2)  Loratadine 10 Mg Tabs (Loratadine) .... Take 1 Tablet By Mouth Once A Day 3)  Lisinopril 20 Mg Tabs (Lisinopril) .... One By Mouth Daily 4)  Mobic 7.5 Mg Tabs (Meloxicam) .... Once Daily As Needed 5)  Nasonex 50 Mcg/act Susp (Mometasone Furoate) .... Spray 2 Spray Into Both Nostrils Every Morning 6)  Allergy Injections 7)  Fish Oil 8)  Vitamin D 9)  Multivitamins   Tabs (Multiple Vitamin) 10)  Tylenol 11)  Maalox 12)  Vitamin C 13)  Aspirin Adult Low Strength 81 Mg Tbec (Aspirin) .Marland Kitchen.. 1 A Day  Allergies (verified): 1)  ! Sulfa 2)  ! Ceftin 3)  ! * Cefzil 4)  ! Pcn 5)  ! Suprax 6)  ! Macrobid  Past History:  Past Medical History: Reviewed history from 09/20/2008 and no changes required. HYPERTENSION  AODM  h/o COLONIC POLYPS  FEMORAL BRUIT  HYPERCALCEMIA, HYPERPARATHYROIDISM, PRIMARY Chronic on-off back pain (on mobic as needed) 1989: status post RML lung  lobectomy, biopsy showed precancerous changes 2005: back and pelvic pain, s/p w-u, CT (-), u/s showed a thick endometrium, s/p Bx per gyn Sees urology for  ureteric obstruction Hyperlipidemia  Past Surgical History: Reviewed history from 05/01/2008 and no changes required. RML lobectomy  1989  thyroid  cystectomy, Colon polypectomy  Social History: Reviewed history from 10/08/2009 and no changes required. 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  alert and well-developed.   Lungs:  normal respiratory effort, no intercostal retractions, and no accessory muscle use.    Heart:  normal rate, regular rhythm, and no murmur.   Extremities:  no pretibial edema bilaterally    Impression & Recommendations:  Problem # 1:  HYPERLIPIDEMIA (ICD-272.4) intolerant to Zocor and Pravachol Intolerant to  Trilipix  Niaspan-- intolerant as well, shaky , flushed  now also intolerant to lopid ...Marland Kitchen"HAs" we discussed her options, she plans to improve her diet, possibly join Weight Watchers. other options include Crestor 3-3  times a week,  Livalo? Plan: Diet, exercise, recheck in 3 months   The following medications were removed from the medication list:    Lopid 600 Mg Tabs (Gemfibrozil) .Marland Kitchen... 1 by mouth two times a day.  Problem # 2:  AODM (ICD-250.00) due for labs, diet and exercise will help w/ DM  as well Does not have  a glucometer, will provide with one if she start using medicines Her updated medication list for this problem includes:    Lisinopril 20 Mg Tabs (Lisinopril) ..... One by mouth daily    Aspirin Adult Low Strength 81 Mg Tbec (Aspirin) .Marland Kitchen... 1 a day  Orders: TLB-A1C / Hgb A1C (Glycohemoglobin) (83036-A1C) Specimen Handling ([address])  Labs Reviewed: Creat: 0.8 (10/08/2009)    Reviewed HgBA1c results: 6.9 (02/02/2010)  6.6 (10/08/2009)  Problem # 3:  HYPERTENSION (ICD-401.9) no change Her updated medication list for this problem includes:    Furosemide 20 Mg Tabs (Furosemide) .Marland KitchenMarland KitchenMarland KitchenMarland Kitchen  1by mouth once daily    Lisinopril 20 Mg Tabs (Lisinopril) ..... One by mouth daily  Orders: Venipuncture ([address]) TLB-BMP (Basic Metabolic Panel-BMET) (80048-METABOL) Specimen Handling (09811)  BP today: 140/82 Prior BP: 134/82 (02/02/2010)  Labs Reviewed: K+: 4.7 (10/08/2009) Creat: : 0.8 (10/08/2009)   Chol: 293 (02/02/2010)   HDL: 42.40 (02/02/2010)   LDL: DEL (05/01/2008)   TG: 544.0 Triglyceride is over 400; calculations on Lipids are invalid. mg/dL (91/47/8295)  Complete Medication List: 1)  Furosemide 20 Mg Tabs (Furosemide) .Marland Kitchen.. 1by mouth once daily 2)  Loratadine 10 Mg Tabs (Loratadine) .... Take 1 tablet by mouth once a day 3)  Lisinopril 20 Mg Tabs (Lisinopril) .... One by mouth daily 4)  Mobic 7.5 Mg Tabs (Meloxicam) .... Once daily as needed 5)  Nasonex 50 Mcg/act Susp (Mometasone furoate) .... Spray 2 spray into both nostrils every morning 6)  Allergy Injections  7)  Fish Oil  8)  Vitamin D  9)  Multivitamins Tabs (Multiple vitamin) 10)  Tylenol  11)  Maalox  12)  Vitamin C  13)  Aspirin Adult Low Strength 81 Mg Tbec (Aspirin) .Marland Kitchen.. 1 a day  Patient Instructions: 1)  Please schedule a follow-up appointment in 3 months . Fasting   Orders Added: 1)  Venipuncture [36415] 2)  TLB-A1C / Hgb A1C (Glycohemoglobin) [83036-A1C] 3)  TLB-BMP (Basic Metabolic Panel-BMET) [80048-METABOL] 4)  Specimen Handling [99000] 5)  Est. Patient Level III [[address]]

## 2010-06-18 NOTE — Letter (Signed)
Summary: [facility]  [facility]   Imported By: Lanelle Bal 05/01/2010 13:44:12  _____________________________________________________________________  External Attachment:    Type:   Image     Comment:   External Document

## 2010-07-17 ENCOUNTER — Telehealth: Payer: Self-pay | Admitting: Internal Medicine

## 2010-07-23 NOTE — Progress Notes (Signed)
Summary: refill--pt is out of meds  Phone Note Refill Request Message from:  Fax from Pharmacy on July 17, 2010 2:45 PM  Refills Requested: Medication #1:  MOBIC 7.5 MG TABS once daily as needed [facility], [address], [place] - fax [phone] - note - patient would like a 90 day supply--she is out as of today  Initial call taken by: Okey Regal Spring,  July 17, 2010 2:53 PM    Prescriptions: MOBIC 7.5 MG TABS (MELOXICAM) once daily as needed  #90 x 0   Entered by:   Army Fossa CMA   Authorized by:   Nolon Rod. Kennedi Lizardo MD   Signed by:   Army Fossa CMA on 07/17/2010   Method used:   Electronically to        [facility]. [phone]* (retail)       [phone]       [place], [place]  [address]       Ph: [phone]       Fax: [phone]   RxID:   [id number]

## 2010-09-29 ENCOUNTER — Other Ambulatory Visit: Payer: Self-pay | Admitting: Internal Medicine

## 2010-10-02 NOTE — Op Note (Signed)
NAME:  Natalie [name], Natalie [name]                        ACCOUNT NO.:  [id number]   MEDICAL RECORD NO.:  [id number]                   PATIENT TYPE:  AMB   LOCATION:  [facility]                                 FACILITY:  [facility]   PHYSICIAN:  Cynthia P. Romine, M.D.             DATE OF BIRTH:  [birth year]   DATE OF PROCEDURE:  01/24/2004  DATE OF DISCHARGE:                                 OPERATIVE REPORT   PREOPERATIVE DIAGNOSES:  1.  Postmenopausal bleeding.  2.  Endometrial polyps noted on sonohysterogram.   POSTOPERATIVE DIAGNOSES:  1.  Postmenopausal bleeding.  2.  Endometrial polyps noted on sonohysterogram.  3.  Pathology pending.   PROCEDURES:  1.  Hysteroscopy.  2.  D&C.  3.  Hysteroscopic resection of polyps.   SURGEON:  Dr. Arline Asp Romine   ANESTHESIA:  General by LMA.   ESTIMATED BLOOD LOSS:  Minimal.   SORBITOL DEFICIT:  40 mL.   COMPLICATIONS:  None.   DESCRIPTION OF PROCEDURE:  The patient was taken to the operating room and  after the induction of adequate general anesthesia by LMA, was placed in the  dorsal lithotomy position and prepped and draped in the usual fashion.  The  bladder was drained with a red rubber catheter.  An anterior and posterior  cervical tenaculum were placed.  The uterus sounded to 8.5 cm.  The cervix  was dilated to a #23 Shawnie Pons.  The diagnostic hysteroscope was introduced, and  hysteroscopy was carried out.  The endometrium appeared quite thin; however,  there were two polyps, one on the left mid segment and one at the posterior  lower uterine segment that were felt to possibly be removed by curettage  alone.  The hysteroscope was removed.  Curettage was carried out.  The  polyps were not removed at curettage.  A diagnostic hysteroscopy again  revealed the polyps to still be present.  Therefore, the cervix was dilated  to a #33 Shawnie Pons, and operative hysteroscopy was carried out with resection of  the polyps with a single loop  cautery.  The specimen  was sent to pathology.  Photographic documentation  was taken of the clean cavity postresection of the polyps.  The instruments  were removed from the vagina, and the procedure was terminated.  The patient  tolerated it well and went in satisfactory condition to postanesthesia  recovery.                                               Cynthia P. Romine, M.D.    CPR/MEDQ  D:  01/24/2004  T:  01/24/2004  Job:  [id number]

## 2010-10-27 ENCOUNTER — Other Ambulatory Visit: Payer: Self-pay | Admitting: Internal Medicine

## 2010-12-10 ENCOUNTER — Other Ambulatory Visit: Payer: Self-pay | Admitting: Dermatology

## 2010-12-17 ENCOUNTER — Other Ambulatory Visit: Payer: Self-pay | Admitting: Internal Medicine

## 2010-12-31 ENCOUNTER — Other Ambulatory Visit: Payer: Self-pay | Admitting: Internal Medicine

## 2010-12-31 NOTE — Telephone Encounter (Signed)
Rx Done . 

## 2011-01-06 ENCOUNTER — Ambulatory Visit (INDEPENDENT_AMBULATORY_CARE_PROVIDER_SITE_OTHER): Payer: Medicare Other | Admitting: Internal Medicine

## 2011-01-06 ENCOUNTER — Encounter: Payer: Self-pay | Admitting: *Deleted

## 2011-01-06 ENCOUNTER — Telehealth: Payer: Self-pay | Admitting: *Deleted

## 2011-01-06 ENCOUNTER — Encounter: Payer: Self-pay | Admitting: Internal Medicine

## 2011-01-06 VITALS — BP 118/70 | HR 89 | Temp 98.4°F | Resp 14 | Wt 204.1 lb

## 2011-01-06 DIAGNOSIS — M549 Dorsalgia, unspecified: Secondary | ICD-10-CM

## 2011-01-06 DIAGNOSIS — B370 Candidal stomatitis: Secondary | ICD-10-CM

## 2011-01-06 LAB — POCT URINALYSIS DIPSTICK
Blood, UA: NEGATIVE
Clarity, UA: NORMAL
Glucose, UA: NEGATIVE
Spec Grav, UA: 1.015
Urobilinogen, UA: 0.2
pH, UA: 6

## 2011-01-06 MED ORDER — NYSTATIN 100000 UNIT/GM EX POWD: CUTANEOUS | Status: DC

## 2011-01-06 NOTE — Telephone Encounter (Signed)
Same Day Abstraction. 

## 2011-01-06 NOTE — Patient Instructions (Signed)
Please schedule a regular  follow up within a month

## 2011-01-06 NOTE — Progress Notes (Signed)
  Subjective:    Patient ID: Natalie [name], female    DOB: 04/09/1933, 77 y.o.   MRN: [id number]  HPI Noted a white film over her tongue for a few days. Today seems better.   Past Medical History  Diagnosis Date  . HTN (hypertension)   . Diabetes mellitus   . Colon polyps   . Femoral bruit   . Hyperparathyroidism   . Back pain, chronic   . S/P lobectomy of lung 1989    precancerous changes  . Pelvic pain in female 2005     back and pelvic pain, s/p w-u, CT (-), u/s showed a thick endometrium, s/p Bx per gyn  . Ureteric obstruction   . Hyperlipidemia      Review of Systems No recent antibiotics although she had 2 crowns put on 2 months ago. She also has a history of hypertension and diabetes, good medication with prescribed medications, no amb CBGs or  BPs  lately. Mild tongue discomfort without actually acute pain    Objective:   Physical Exam Alert, oriented, in no apparent distress. Throat is free of lesions, tonge is essentially normal except for a very thin white film mostly at the posterior third of the tongue.  Papillae are not red or swollen  Lungs clear to auscultation bilaterally Cardiovascular irregular rhythm without murmur      Assessment & Plan:  Thrush: Very likely she has thrush, symptoms are spontaneously resolving.  will treat with nystatin, patient to let me know if not improving. Diabetes, hypertension: need to a regular checkup, has not been seen in several months although appears to be doing well.

## 2011-01-07 NOTE — Progress Notes (Signed)
Quick Note:  THIS URINALYSIS/DIPSTICK ENTERED IN ERROR--WRONG PATIENT--ORDER CORRECTED. PC BILLING NOTIFIED TO CREDIT THE CHARGE TO PATIENT ACCOUNT. ______

## 2011-01-26 ENCOUNTER — Other Ambulatory Visit: Payer: Self-pay | Admitting: Internal Medicine

## 2011-02-05 ENCOUNTER — Ambulatory Visit (INDEPENDENT_AMBULATORY_CARE_PROVIDER_SITE_OTHER): Payer: Medicare Other | Admitting: Internal Medicine

## 2011-02-05 ENCOUNTER — Encounter: Payer: Self-pay | Admitting: Internal Medicine

## 2011-02-05 DIAGNOSIS — E785 Hyperlipidemia, unspecified: Secondary | ICD-10-CM

## 2011-02-05 DIAGNOSIS — Z0000 Encounter for general adult medical examination without abnormal findings: Secondary | ICD-10-CM | POA: Insufficient documentation

## 2011-02-05 DIAGNOSIS — E119 Type 2 diabetes mellitus without complications: Secondary | ICD-10-CM

## 2011-02-05 DIAGNOSIS — R002 Palpitations: Secondary | ICD-10-CM

## 2011-02-05 DIAGNOSIS — I10 Essential (primary) hypertension: Secondary | ICD-10-CM

## 2011-02-05 DIAGNOSIS — R0989 Other specified symptoms and signs involving the circulatory and respiratory systems: Secondary | ICD-10-CM

## 2011-02-05 LAB — LIPID PANEL
Cholesterol: 249 mg/dL — ABNORMAL HIGH (ref 0–200)
HDL: 42.1 mg/dL (ref >39.00)
Total CHOL/HDL Ratio: 6
Triglycerides: 450 mg/dL — ABNORMAL HIGH (ref 0.0–149.0)
VLDL: 90 mg/dL — ABNORMAL HIGH (ref 0.0–40.0)

## 2011-02-05 LAB — BASIC METABOLIC PANEL
BUN: 14 mg/dL (ref 6–23)
CO2: 27 mEq/L (ref 19–32)
Calcium: 9.1 mg/dL (ref 8.4–10.5)
Chloride: 103 mEq/L (ref 96–112)
Creatinine, Ser: 0.8 mg/dL (ref 0.4–1.2)
GFR: 79.87 mL/min (ref >60.00)
Glucose, Bld: 112 mg/dL — ABNORMAL HIGH (ref 70–99)
Potassium: 4.2 mEq/L (ref 3.5–5.1)
Sodium: 140 mEq/L (ref 135–145)

## 2011-02-05 LAB — CBC WITH DIFFERENTIAL/PLATELET
Basophils Absolute: 0 10*3/uL (ref 0.0–0.1)
Basophils Relative: 0.6 % (ref 0.0–3.0)
Eosinophils Absolute: 0.3 10*3/uL (ref 0.0–0.7)
Eosinophils Relative: 3.3 % (ref 0.0–5.0)
HCT: 40.5 % (ref 36.0–46.0)
Hemoglobin: 13.4 g/dL (ref 12.0–15.0)
Lymphocytes Relative: 34.1 % (ref 12.0–46.0)
Lymphs Abs: 2.9 10*3/uL (ref 0.7–4.0)
MCHC: 33.2 g/dL (ref 30.0–36.0)
MCV: 91.8 fl (ref 78.0–100.0)
Monocytes Absolute: 0.8 10*3/uL (ref 0.1–1.0)
Monocytes Relative: 9.4 % (ref 3.0–12.0)
Neutro Abs: 4.5 10*3/uL (ref 1.4–7.7)
Neutrophils Relative %: 52.6 % (ref 43.0–77.0)
Platelets: 272 10*3/uL (ref 150.0–400.0)
RBC: 4.41 Mil/uL (ref 3.87–5.11)
RDW: 14.1 % (ref 11.5–14.6)
WBC: 8.6 10*3/uL (ref 4.5–10.5)

## 2011-02-05 LAB — LDL CHOLESTEROL, DIRECT: Direct LDL: 135.9 mg/dL

## 2011-02-05 LAB — HEMOGLOBIN A1C: Hgb A1c MFr Bld: 6.8 % — ABNORMAL HIGH (ref 4.6–6.5)

## 2011-02-05 NOTE — Assessment & Plan Note (Signed)
Labs

## 2011-02-05 NOTE — Assessment & Plan Note (Signed)
No change, labs  

## 2011-02-05 NOTE — Assessment & Plan Note (Addendum)
intolerant to Zocor and Pravachol Intolerant to  Trilipix  Niaspan-- intolerant as well, shaky , flushed  intolerant to lopid ...Marland Kitchen"HAs" Labs , diet exercise encouraged  Livalo? crestor x 3 times a week?

## 2011-02-05 NOTE — Progress Notes (Signed)
Subjective:    Patient ID: Natalie [name], female    DOB: 03-03-1933, 77 y.o.   MRN: [id number]  HPI Here for Medicare AWV: 1. Risk factors based on Past M, S, F history: yes  2. Physical Activities: active , [place]  to [place], some leg pain see below 3. Depression/mood: occasionally feels "down" take OTC "[place]" and helps , no suicidal                   ideas   4. Hearing: slightly  decreased L>R, last year canceled referral to an audiologist, not ready yet to        be assessed again 5. ADL's: totally independent  6. Fall Risk: average risk, counseled 7. Home Safety: feeels safe at home  8. Height, weight, &visual acuity:, see VS, sees eye doctor regularly 9. Counseling: yes  10. Labs ordered based on risk factors: yes  11.           Referral Coordination: if needed  12.           Care Plan: see a/p  13.            Cognitive Assessment , motor skills, memory and cognition seem appropriate  in addition, we discussed the following issues Tongue "film still there" (see last OV) Palpitations: occasionally heart flip flop, last seconds, no associated sx, EKG? Noted a bulge in the R popliteal area few weeks ago, more noticeable when stands up HYPERTENSION -- rarely ambulatory BPs in the 120s/80s on average  AODM -- no ambulatory CBGs , diet has improved a little  Hyperlipidemia-- on fish oil and a OTC (cholestoff?)   Past Medical History  Diagnosis Date  . HTN (hypertension)   . Diabetes mellitus   . Colon polyps   . Femoral bruit   . Hyperparathyroidism   . Back pain, chronic   . S/P lobectomy of lung 1989    precancerous changes  . Pelvic pain in female 2005     back and pelvic pain, s/p w-u, CT (-), u/s showed a thick endometrium, s/p Bx per gyn  . Ureteric obstruction     sees urology  . Hyperlipidemia    Past Surgical History  Procedure Date  . Lobectomy     RML  . Cystectomy     thyriod  . 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  Respiratory: Negative for cough, shortness of breath and wheezing.   Gastrointestinal: Negative for abdominal pain and blood in stool.  Genitourinary: Negative for hematuria, vaginal bleeding, vaginal discharge and difficulty urinating.       Objective:   Physical Exam  Constitutional: She is oriented to person, place, and time. She appears well-developed. No distress.  HENT:  Head: Normocephalic and atraumatic.       Tongue normal  Neck: No thyromegaly present.       Nl carotid pulse   Cardiovascular: Normal rate, regular rhythm and normal heart sounds.   No murmur heard.      abd bruit present w/o mass. Femoral pulses + bilaterally, + bruit on the  L only Pedal pulses normal B  Pulmonary/Chest: Effort normal and breath sounds normal. No respiratory distress. She has no wheezes. She has no rales.  Musculoskeletal: She exhibits no edema.       Legs:      R calf 1/2 cm larger, no tender   Neurological: She is alert and oriented to person, place, and time.  Skin: She is not diaphoretic.  Psychiatric: She has a normal mood and affect. Her behavior is normal. Judgment and thought content normal.          Assessment & Plan:  Palpitations:  EKG today @ baseline  Will call if sx increase  Tonge exam now normal

## 2011-02-05 NOTE — Assessment & Plan Note (Addendum)
Td 2009, pneumonia shot 04-2005 (completed) Flu shot recommended Shingles immunization: 9-11 Female care per gynecology.  mammogram 12/2009, normal; schedule to have one PAPs per gyn   DEXA 11-2006, normaL, and 12-2008 normal as well  counseled about diet-exercise , ca and vit d  last colonoscopy 7-11, was told next in 5 years  Diet-exercise discussed

## 2011-02-06 ENCOUNTER — Telehealth: Payer: Self-pay | Admitting: Internal Medicine

## 2011-02-06 DIAGNOSIS — I714 Abdominal aortic aneurysm, without rupture, unspecified: Secondary | ICD-10-CM

## 2011-02-06 DIAGNOSIS — I739 Peripheral vascular disease, unspecified: Secondary | ICD-10-CM

## 2011-02-06 NOTE — Assessment & Plan Note (Addendum)
Femoral and abd bruit, popliteal mass: Refer to Ao u/s , LE dopplers  R/O PVD, AAA, Backers cyst

## 2011-02-06 NOTE — Telephone Encounter (Signed)
Femoral and abd bruit, popliteal mass: Needs an Ao u/s , LE dopplers  R/O PVD, AAA, Backers cyst

## 2011-02-08 NOTE — Telephone Encounter (Signed)
Entered orders , please follow up

## 2011-02-09 ENCOUNTER — Other Ambulatory Visit: Payer: Self-pay | Admitting: Internal Medicine

## 2011-02-09 NOTE — Telephone Encounter (Signed)
Ok 90 days and 1 RF

## 2011-02-10 MED ORDER — MELOXICAM 7.5 MG PO TABS: 7.5000 mg | ORAL_TABLET | Freq: Every day | ORAL | Status: DC

## 2011-02-10 NOTE — Telephone Encounter (Signed)
Done

## 2011-02-10 NOTE — Telephone Encounter (Signed)
Appointments scheduled & I made patient aware.

## 2011-02-11 ENCOUNTER — Telehealth: Payer: Self-pay | Admitting: *Deleted

## 2011-02-11 DIAGNOSIS — E785 Hyperlipidemia, unspecified: Secondary | ICD-10-CM

## 2011-02-11 MED ORDER — ROSUVASTATIN CALCIUM 5 MG PO TABS: 5.0000 mg | ORAL_TABLET | Freq: Every day | ORAL | Status: DC

## 2011-02-11 NOTE — Telephone Encounter (Signed)
Message copied by Regis Bill on Thu Feb 11, 2011  5:26 PM ------      Message from: Willow Ora E      Created: Wed Feb 10, 2011  8:32 AM       Advise patient:      Diabetes well controlled      Cholesterol continue to be  elevated -----> I recommend a trial with Crestor 5 mg every other day. Provide samples if available, FLP in 3 months DX hyperlipidemia      Other labs normal

## 2011-02-11 NOTE — Telephone Encounter (Signed)
Patient Informed. Going out of town for one month early tomorrow morning; Rx to pharmacy for #90x0. Will give samples when returns intown until next lab check [3-mths] if needed. Order for Lipid panel placed. Results mailed.

## 2011-02-15 ENCOUNTER — Encounter: Payer: Self-pay | Admitting: Internal Medicine

## 2011-03-10 ENCOUNTER — Other Ambulatory Visit: Payer: Medicare Other

## 2011-04-01 ENCOUNTER — Other Ambulatory Visit: Payer: Self-pay | Admitting: Internal Medicine

## 2011-04-12 ENCOUNTER — Other Ambulatory Visit: Payer: Medicare Other

## 2011-04-28 ENCOUNTER — Other Ambulatory Visit: Payer: Self-pay | Admitting: Internal Medicine

## 2011-04-29 ENCOUNTER — Encounter: Payer: Medicare Other | Admitting: Vascular Surgery

## 2011-05-20 ENCOUNTER — Other Ambulatory Visit (INDEPENDENT_AMBULATORY_CARE_PROVIDER_SITE_OTHER): Payer: Medicare Other | Admitting: *Deleted

## 2011-05-20 ENCOUNTER — Ambulatory Visit (INDEPENDENT_AMBULATORY_CARE_PROVIDER_SITE_OTHER): Payer: Medicare Other | Admitting: *Deleted

## 2011-05-20 DIAGNOSIS — R0989 Other specified symptoms and signs involving the circulatory and respiratory systems: Secondary | ICD-10-CM

## 2011-05-20 DIAGNOSIS — I714 Abdominal aortic aneurysm, without rupture, unspecified: Secondary | ICD-10-CM

## 2011-05-20 DIAGNOSIS — I739 Peripheral vascular disease, unspecified: Secondary | ICD-10-CM

## 2011-05-21 NOTE — Procedures (Unsigned)
LOWER EXTREMITY ARTERIAL DUPLEX  INDICATION:  Femoral bruit; popliteal mass.  HISTORY: Diabetes:  Yes. Cardiac:  No. Hypertension:  Yes. Smoking:  No. Previous Surgery:  No.  SINGLE LEVEL ARTERIAL EXAM                         RIGHT                LEFT Brachial: Anterior tibial: Posterior tibial: Peroneal: Ankle/Brachial Index:  LOWER EXTREMITY ARTERIAL DUPLEX EXAM  DUPLEX: 1. Widely patent bilateral femoral and popliteal arteries without     evidence of dilation or aneurysm. 2. Small nonvascularized hypoechoic structure in the medial popliteal     fossa of the right lower extremity measuring approximately 0.85 cm     X 1.8 cm.  IMPRESSION: 1. No evidence of bilateral lower extremity aneurysm or stenosis. 2. Hypoechoic structure in the right popliteal fossa, as described     above. 3. Incidentally, the popliteal veins are compressible with normal flow     dynamics.   ___________________________________________ V. Charlena Cross, MD  LT/MEDQ  D:  05/21/2011  T:  05/21/2011  Job:  [id number]

## 2011-06-08 ENCOUNTER — Encounter: Payer: Self-pay | Admitting: Internal Medicine

## 2011-06-08 ENCOUNTER — Ambulatory Visit (INDEPENDENT_AMBULATORY_CARE_PROVIDER_SITE_OTHER): Payer: Medicare Other | Admitting: Internal Medicine

## 2011-06-08 VITALS — BP 110/78 | HR 79 | Temp 98.3°F | Resp 14 | Wt 207.4 lb

## 2011-06-08 DIAGNOSIS — I10 Essential (primary) hypertension: Secondary | ICD-10-CM

## 2011-06-08 DIAGNOSIS — I714 Abdominal aortic aneurysm, without rupture, unspecified: Secondary | ICD-10-CM

## 2011-06-08 DIAGNOSIS — Z23 Encounter for immunization: Secondary | ICD-10-CM

## 2011-06-08 DIAGNOSIS — R0989 Other specified symptoms and signs involving the circulatory and respiratory systems: Secondary | ICD-10-CM

## 2011-06-08 DIAGNOSIS — E785 Hyperlipidemia, unspecified: Secondary | ICD-10-CM

## 2011-06-08 NOTE — Assessment & Plan Note (Signed)
Request a pneumonia shot: done

## 2011-06-08 NOTE — Progress Notes (Signed)
  Subjective:    Patient ID: Natalie [name], female    DOB: 1932-06-27, 78 y.o.   MRN: [id number]  HPI Routine office visit Since her last office visit she try Crestor, unable to tolerate it d/t aches and pains even after she held  and restart it for 2 weeks.  Past Medical History: HTN Diabetes Mellitus hyperlipidenmia h/o COLONIC POLYPS  FEMORAL BRUIT  HYPERCALCEMIA, HYPERPARATHYROIDISM, PRIMARY Chronic on-off back pain (on mobic as needed), sees Dr Shon Baton 2005: back and pelvic pain, s/p w-u, CT (-), u/s showed a thick endometrium, s/p Bx per gyn Sees urology for  ureteric obstruction   Past Surgical History: 1989: status post RML lung  lobectomy, biopsy showed precancerous changes 1989  thyroid cystectomy, Colon polypectomy  Family History:  Rheumatoid Arthritis-- cousins  Diabetes-- B x 2  hypertension-- B, M  brain tumor--M  breast Ca--no  colon ca--no  Social History:  Married, 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 Dr. Shon Baton, taking Mobic and Tylenol. Denies CP, shortness of breath No nausea, vomiting, diarrhea    Objective:   Physical Exam  Constitutional: She is oriented to person, place, and time. She appears well-developed and well-nourished.  Cardiovascular: Normal rate, regular rhythm and normal heart sounds.   No murmur heard. Pulmonary/Chest: Breath sounds normal. No respiratory distress. She has no wheezes. She has no rales.  Musculoskeletal: She exhibits no edema.  Neurological: She is alert and oriented to person, place, and time.       Assessment & Plan:

## 2011-06-08 NOTE — Assessment & Plan Note (Signed)
Stable, no change

## 2011-06-08 NOTE — Assessment & Plan Note (Signed)
Unable to treat, see overview rec to take O3FA OTC

## 2011-06-08 NOTE — Assessment & Plan Note (Addendum)
Vascular studies done, reports not available at this time, will get them ASAP. Addendum: Apparently aorta ultrasound was not done. We'll reorder. LE  ultrasound reviewed, see below.  She does not have an aneurysm in the popliteal artery. Has a hypoechoic structure, cyst?.   LOWER EXTREMITY ARTERIAL DUPLEX EXAM  1. Widely patent bilateral femoral and popliteal arteries without evidence of dilation or aneurysm.  2. Small nonvascularized hypoechoic structure in the medial popliteal fossa of the right lower extremity measuring approximately 0.85 cm X 1.8 cm.  IMPRESSION:  1. No evidence of bilateral lower extremity aneurysm or stenosis.  2. Hypoechoic structure in the right popliteal fossa, as described above.  3. Incidentally, the popliteal veins are compressible with normal flow dynamics.

## 2011-06-10 ENCOUNTER — Encounter: Payer: Self-pay | Admitting: Internal Medicine

## 2011-06-14 ENCOUNTER — Other Ambulatory Visit: Payer: Medicare Other

## 2011-06-16 ENCOUNTER — Other Ambulatory Visit: Payer: Medicare Other

## 2011-06-18 ENCOUNTER — Other Ambulatory Visit: Payer: Medicare Other

## 2011-06-21 ENCOUNTER — Ambulatory Visit (INDEPENDENT_AMBULATORY_CARE_PROVIDER_SITE_OTHER): Payer: Medicare Other | Admitting: Internal Medicine

## 2011-06-21 ENCOUNTER — Encounter: Payer: Self-pay | Admitting: Internal Medicine

## 2011-06-21 VITALS — BP 144/88 | HR 83 | Temp 98.3°F | Wt 204.0 lb

## 2011-06-21 DIAGNOSIS — J069 Acute upper respiratory infection, unspecified: Secondary | ICD-10-CM

## 2011-06-21 MED ORDER — AZITHROMYCIN 250 MG PO TABS: ORAL_TABLET | ORAL | Status: AC

## 2011-06-21 NOTE — Progress Notes (Signed)
  Subjective:    Patient ID: Natalie [name], female    DOB: 1932-08-18, 78 y.o.   MRN: [id number]  HPI Acute visit Symptoms started a week ago with congested nose, sore throat, hoarseness. She has also developed mild cough with a small amount of brown sputum. Overall she feels slightly better today but not completely well. She has taken Mucinex and Sudafed over-the-counter. Diastolic blood pressure 88 today.  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    Objective:   Physical Exam  Constitutional: She appears well-developed. No distress.  HENT:  Head: Normocephalic and atraumatic.  Right Ear: External ear normal.  Left Ear: External ear normal.  Mouth/Throat: No oropharyngeal exudate.       Face symmetric, nontender to palpation.  Cardiovascular: Normal rate, regular rhythm and normal heart sounds.   No murmur heard. Pulmonary/Chest: Effort normal and breath sounds normal. No respiratory distress. She has no wheezes. She has no rales.  Skin: She is not diaphoretic.      Assessment & Plan:  URI: URI, already feeling slightly better after one week. See instructions, strongly recommend to avoid antibiotics if her recovery continue

## 2011-06-21 NOTE — Patient Instructions (Signed)
Rest, fluids , tylenol For cough, take Mucinex DM twice a day as needed  Avoid  Sudafed (pseudoephedrine) but you can use your nasal spray and saline lavages to help congestion Only if not better in few days , start the antibiotic as prescribed ----> zpack Call if no better in few days Call anytime if the symptoms are severe

## 2011-06-21 NOTE — Procedures (Unsigned)
DUPLEX ULTRASOUND OF ABDOMINAL AORTA  INDICATION:  Abdominal bruit.  HISTORY: Diabetes:  Yes. Cardiac:  No. Hypertension:  Yes. Smoking:  No. Connective Tissue Disorder: Family History:  No. Previous Surgery:  No.  DUPLEX EXAM:         AP (cm)                   TRANSVERSE (cm) Proximal             1.83 cm                   1.87 cm Mid                  1.46 cm                   1.46 cm Distal               1.18 cm                   1.23 cm Right Iliac          1.05 cm Left Iliac           1.02 cm  PREVIOUS:  None  IMPRESSION:  No evidence of abdominal aortic aneurysm with normal flow dynamics and diameters of the bilateral common iliac artery.  ___________________________________________ V. Charlena Cross, MD  LT/MEDQ  D:  05/21/2011  T:  05/21/2011  Job:  [id number]

## 2011-06-23 ENCOUNTER — Other Ambulatory Visit: Payer: Medicare Other

## 2011-06-29 ENCOUNTER — Inpatient Hospital Stay: Admission: RE | Admit: 2011-06-29 | Payer: Medicare Other | Source: Ambulatory Visit

## 2011-07-02 ENCOUNTER — Ambulatory Visit
Admission: RE | Admit: 2011-07-02 | Discharge: 2011-07-02 | Disposition: A | Discharge status: Home / Self Care | Payer: Medicare Other | Source: Ambulatory Visit | Attending: Internal Medicine | Admitting: Internal Medicine

## 2011-07-02 DIAGNOSIS — I714 Abdominal aortic aneurysm, without rupture, unspecified: Secondary | ICD-10-CM

## 2011-07-27 ENCOUNTER — Other Ambulatory Visit: Payer: Self-pay | Admitting: Internal Medicine

## 2011-07-27 NOTE — Telephone Encounter (Signed)
Prescription sent to pharmacy.

## 2011-08-27 ENCOUNTER — Other Ambulatory Visit (HOSPITAL_COMMUNITY): Payer: Self-pay | Admitting: Urology

## 2011-08-27 DIAGNOSIS — N1330 Unspecified hydronephrosis: Secondary | ICD-10-CM

## 2011-09-01 ENCOUNTER — Other Ambulatory Visit (HOSPITAL_COMMUNITY): Payer: Medicare Other

## 2011-09-10 ENCOUNTER — Ambulatory Visit (HOSPITAL_COMMUNITY)
Admission: RE | Admit: 2011-09-10 | Discharge: 2011-09-10 | Disposition: A | Discharge status: Home / Self Care | Payer: Medicare Other | Source: Ambulatory Visit | Attending: Urology | Admitting: Urology

## 2011-09-10 ENCOUNTER — Encounter (HOSPITAL_COMMUNITY): Payer: Self-pay

## 2011-09-10 DIAGNOSIS — N1330 Unspecified hydronephrosis: Secondary | ICD-10-CM

## 2011-09-10 MED ORDER — FUROSEMIDE 10 MG/ML IJ SOLN
40.0000 mg | Freq: Once | INTRAMUSCULAR | Status: DC
  Filled 2011-09-10: qty 4

## 2011-09-10 MED ORDER — TECHNETIUM TC 99M MERTIATIDE
15.6000 | Freq: Once | INTRAVENOUS | Status: AC | PRN
  Administered 2011-09-10: 15.6 via INTRAVENOUS

## 2011-09-18 ENCOUNTER — Other Ambulatory Visit: Payer: Self-pay | Admitting: Internal Medicine

## 2011-09-20 NOTE — Telephone Encounter (Signed)
Refill done.  

## 2011-11-02 LAB — HM DIABETES EYE EXAM: HM Diabetic Eye Exam: NEGATIVE

## 2011-11-15 ENCOUNTER — Encounter: Payer: Self-pay | Admitting: Internal Medicine

## 2011-11-15 ENCOUNTER — Ambulatory Visit (INDEPENDENT_AMBULATORY_CARE_PROVIDER_SITE_OTHER): Payer: Medicare Other | Admitting: Internal Medicine

## 2011-11-15 VITALS — BP 136/82 | HR 83 | Temp 98.2°F | Wt 204.0 lb

## 2011-11-15 DIAGNOSIS — I10 Essential (primary) hypertension: Secondary | ICD-10-CM

## 2011-11-15 DIAGNOSIS — J40 Bronchitis, not specified as acute or chronic: Secondary | ICD-10-CM | POA: Insufficient documentation

## 2011-11-15 DIAGNOSIS — E119 Type 2 diabetes mellitus without complications: Secondary | ICD-10-CM

## 2011-11-15 DIAGNOSIS — E785 Hyperlipidemia, unspecified: Secondary | ICD-10-CM

## 2011-11-15 MED ORDER — DOXYCYCLINE HYCLATE 100 MG PO TABS: 100.0000 mg | ORAL_TABLET | Freq: Two times a day (BID) | ORAL | Status: DC

## 2011-11-15 MED ORDER — ALBUTEROL SULFATE HFA 108 (90 BASE) MCG/ACT IN AERS: 2.0000 | INHALATION_SPRAY | Freq: Four times a day (QID) | RESPIRATORY_TRACT | Status: DC | PRN

## 2011-11-15 NOTE — Patient Instructions (Signed)
Rest, fluids , tylenol For cough, take Mucinex DM twice a day as needed  For cough, chest congestion, wheezing use ventolin  every 6 hours as needed  until you feel better Take the antibiotic as prescribed  (doxycycline) Call if no better in few days Call anytime if the symptoms are severe

## 2011-11-15 NOTE — Assessment & Plan Note (Signed)
Based on the last cholesterol panel I recommended Crestor the patient decided not to take it. Her lifestyle has not been good in the last few months. Counseled about diet, exercise. Will check an FLP when she comes back, she is not fasting today.

## 2011-11-15 NOTE — Assessment & Plan Note (Signed)
Use of about diet, exercise. Check the A1c

## 2011-11-15 NOTE — Assessment & Plan Note (Signed)
Due for a BMP. 

## 2011-11-15 NOTE — Progress Notes (Signed)
  Subjective:    Patient ID: Natalie [name], female    DOB: June 07, 1932, 78 y.o.   MRN: [id number]  HPI Acute visit Symptoms started about 8 days ago with cough, chest congestion, sinus pressure. 7 days ago went to see her allergies and she was prescribed a Z-Pak. She's not improving. Continue with same symptoms along with wheezing for the last 2 days and a mild shortness of breath. She is blowing clear nasal discharge and coughing up colored sputum without hemoptysis.  since the patient is here, we talk about her chronic medical problems: Hypertension, good medication compliance, ambulatory BPs are checked at the [facility] and they are normal. Diabetes, admits that her diet and exercise have not been good in the last few weeks.  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 Has ~ 3  other family members are affected with similar respiratory symptoms.     Objective:   Physical Exam  General -- alert, well-developed, and overweight appearing. No apparent distress.  HEENT -- TMs normal, throat w/o redness, face symmetric and not  tender to palpation, nose slt  congested   Lungs -- normal respiratory effort, no intercostal retractions, no accessory muscle use. She has increased expiratory time, + rhonchi throughout. No crackles..  Heart-- normal rate, regular rhythm, no murmur, and no gallop.   Extremities-- no pretibial edema bilaterally  Neurologic-- alert & oriented X3 and strength normal in all extremities. Psych-- Cognition and judgment appear intact. Alert and cooperative with normal attention span and concentration.  not anxious appearing and not depressed appearing.       Assessment & Plan:

## 2011-11-15 NOTE — Assessment & Plan Note (Signed)
symptoms consistent with bronchitis, she has a history of asthma and has used inhalers before. Saw her  allergist last week, took a Z-Pak but is not doing better. Plan: se  instructions

## 2011-11-22 ENCOUNTER — Other Ambulatory Visit: Payer: Self-pay | Admitting: Internal Medicine

## 2011-11-22 MED ORDER — HYDROCODONE-HOMATROPINE 5-1.5 MG/5ML PO SYRP: 5.0000 mL | ORAL_SOLUTION | Freq: Three times a day (TID) | ORAL | Status: AC | PRN

## 2011-11-22 NOTE — Telephone Encounter (Signed)
Ok to send in refill

## 2011-11-22 NOTE — Telephone Encounter (Signed)
She recently took a Z-Pak prescribed by her allergies and then one week off doxycycline. she does not need more antibiotics. Recommend to continue using Mucinex DM, albuterol as needed also I just printed a prescription for cough suppressant to be taken as needed, watch for drowsiness. Office visit in few days if not better

## 2011-11-22 NOTE — Telephone Encounter (Signed)
wants refill for doxycycline (VIBRA-TABS) 100 MG tablet, patient call back [phone] States she still has a cough and would like another round on antibiotics

## 2011-11-22 NOTE — Telephone Encounter (Signed)
Discussed with pt, sent in rx.  

## 2011-11-23 ENCOUNTER — Ambulatory Visit (INDEPENDENT_AMBULATORY_CARE_PROVIDER_SITE_OTHER): Payer: Medicare Other | Admitting: Internal Medicine

## 2011-11-23 ENCOUNTER — Encounter: Payer: Self-pay | Admitting: Internal Medicine

## 2011-11-23 VITALS — BP 138/72 | HR 72 | Temp 98.2°F | Wt 203.0 lb

## 2011-11-23 DIAGNOSIS — J40 Bronchitis, not specified as acute or chronic: Secondary | ICD-10-CM

## 2011-11-23 MED ORDER — ONETOUCH ULTRASOFT LANCETS MISC: Status: AC

## 2011-11-23 MED ORDER — PREDNISONE 10 MG PO TABS: ORAL_TABLET | ORAL | Status: DC

## 2011-11-23 MED ORDER — GLUCOSE BLOOD VI STRP: ORAL_STRIP | Status: DC

## 2011-11-23 NOTE — Progress Notes (Signed)
  Subjective:    Patient ID: Natalie [name], female    DOB: 09-12-1932, 78 y.o.   MRN: [id number]  HPI Acute visit, not feeling better. Respiratory symptoms started approximately 2-1/2 weeks ago, she saw her allergist, prescribed a Z-Pak. Subsequently I saw her last week and prescribed doxycycline, she gradually improved, never felt 100% well;  Last night she had some chills (yesterday afternoon she had her allergy shots). At this point her cough is not severe but she continue producing some green sputum. She has some shortness of breath the last time and that is slightly better. Wheezing is definitely better. Sinuses continue to be mildly congested but no discharge. Denies chest pain but still has some chest tightness, the tightness is steady,  worse when she coughs  Past Medical History:   HTN   Diabetes Mellitus   hyperlipidenmia   Allergies, remote asthma, sees an allergist h/o COLONIC POLYPS   FEMORAL BRUIT   HYPERCALCEMIA, HYPERPARATHYROIDISM, PRIMARY   Chronic on-off back pain (on mobic as needed), sees Dr Shon Baton   2005: back and pelvic pain, s/p w-u, CT (-), u/s showed a thick endometrium, s/p Bx per gyn   Sees urology for ureteric obstruction   Past Surgical History:   1989: status post RML lung lobectomy, biopsy showed precancerous changes   1989 thyroid cystectomy,   Colon polypectomy    Social History:   Married, 4 children   Retired, Lawyer   tobacco-- never   ETOH-- rarely   diet-- does well on-off   exercise--some see above   Family History:   Rheumatoid Arthritis-- cousins   Diabetes-- B x 2   hypertension-- B, M   brain tumor--M   breast Ca--no   colon ca--no   Review of Systems See HPI    Objective:   Physical Exam General -- alert, well-developed, and overweight appearing. No apparent distress.  HEENT -- TMs normal, throat w/o redness, face symmetric and not tender to palpation, nose slt congested  Lungs -- normal respiratory  effort, no intercostal retractions, no accessory muscle use, and normal breath sounds.   Heart-- normal rate, regular rhythm, no murmur, and no gallop.   Extremities-- no pretibial edema bilaterally  Neurologic-- alert & oriented X3 and strength normal in all extremities. Psych-- Cognition and judgment appear intact. Alert and cooperative with normal attention span and concentration.  not anxious appearing and not depressed appearing.       Assessment & Plan:

## 2011-11-23 NOTE — Assessment & Plan Note (Addendum)
Ongoing respiratory symptoms for 2.5  weeks, I am somehow concerned about a "tightness in the chest" because she is diabetic however symptoms started clearly as a respiratory infection, 3 family members affected with similar sx. Plan:  chest x-ray I don't think she needs further antibiotics Prednisone We provided a glucometer, CBG today is 100.  See instructions

## 2011-11-23 NOTE — Patient Instructions (Addendum)
Please get your x-ray at the other [facility]  office located at: [address] [place], across from [facility].  Please go to the basement, this is a walk-in facility, they are open from 8:30 to 5:30 PM. Phone number [phone]. ----- Continue with Mucinex DM, Hycodan for cough Albuterol as needed for wheezing Prednisone for a few days While on prednisone, your blood sugar may be a little high,check the sugar at least once a day, call if he if the blood sugar is more than 200. Call if not better in few days

## 2011-11-24 ENCOUNTER — Ambulatory Visit (INDEPENDENT_AMBULATORY_CARE_PROVIDER_SITE_OTHER)
Admission: RE | Admit: 2011-11-24 | Discharge: 2011-11-24 | Disposition: A | Discharge status: Home / Self Care | Payer: Medicare Other | Source: Ambulatory Visit | Attending: Internal Medicine | Admitting: Internal Medicine

## 2011-11-24 DIAGNOSIS — J40 Bronchitis, not specified as acute or chronic: Secondary | ICD-10-CM

## 2011-12-07 ENCOUNTER — Ambulatory Visit: Payer: Medicare Other | Admitting: Internal Medicine

## 2012-02-15 ENCOUNTER — Encounter: Payer: Self-pay | Admitting: Internal Medicine

## 2012-02-15 ENCOUNTER — Ambulatory Visit (INDEPENDENT_AMBULATORY_CARE_PROVIDER_SITE_OTHER): Payer: Medicare Other | Admitting: Internal Medicine

## 2012-02-15 VITALS — BP 136/74 | HR 74 | Temp 98.1°F | Ht 65.75 in | Wt 202.0 lb

## 2012-02-15 DIAGNOSIS — R079 Chest pain, unspecified: Secondary | ICD-10-CM

## 2012-02-15 DIAGNOSIS — Z0000 Encounter for general adult medical examination without abnormal findings: Secondary | ICD-10-CM

## 2012-02-15 DIAGNOSIS — Z23 Encounter for immunization: Secondary | ICD-10-CM

## 2012-02-15 DIAGNOSIS — E119 Type 2 diabetes mellitus without complications: Secondary | ICD-10-CM

## 2012-02-15 DIAGNOSIS — E785 Hyperlipidemia, unspecified: Secondary | ICD-10-CM

## 2012-02-15 DIAGNOSIS — M549 Dorsalgia, unspecified: Secondary | ICD-10-CM

## 2012-02-15 DIAGNOSIS — I10 Essential (primary) hypertension: Secondary | ICD-10-CM

## 2012-02-15 DIAGNOSIS — R0989 Other specified symptoms and signs involving the circulatory and respiratory systems: Secondary | ICD-10-CM

## 2012-02-15 LAB — CBC WITH DIFFERENTIAL/PLATELET
Basophils Absolute: 0.1 10*3/uL (ref 0.0–0.1)
Basophils Relative: 0.7 % (ref 0.0–3.0)
Eosinophils Absolute: 0.3 10*3/uL (ref 0.0–0.7)
Eosinophils Relative: 3.4 % (ref 0.0–5.0)
HCT: 40.7 % (ref 36.0–46.0)
Hemoglobin: 13.1 g/dL (ref 12.0–15.0)
Lymphocytes Relative: 35.9 % (ref 12.0–46.0)
Lymphs Abs: 2.9 10*3/uL (ref 0.7–4.0)
MCHC: 32.3 g/dL (ref 30.0–36.0)
MCV: 93 fl (ref 78.0–100.0)
Monocytes Absolute: 0.8 10*3/uL (ref 0.1–1.0)
Monocytes Relative: 9.4 % (ref 3.0–12.0)
Neutro Abs: 4 10*3/uL (ref 1.4–7.7)
Neutrophils Relative %: 50.6 % (ref 43.0–77.0)
Platelets: 296 10*3/uL (ref 150.0–400.0)
RBC: 4.38 Mil/uL (ref 3.87–5.11)
RDW: 13.5 % (ref 11.5–14.6)
WBC: 7.9 10*3/uL (ref 4.5–10.5)

## 2012-02-15 LAB — LIPID PANEL
Cholesterol: 239 mg/dL — ABNORMAL HIGH (ref 0–200)
HDL: 36.7 mg/dL — ABNORMAL LOW (ref >39.00)
Total CHOL/HDL Ratio: 7
Triglycerides: 399 mg/dL — ABNORMAL HIGH (ref 0.0–149.0)
VLDL: 79.8 mg/dL — ABNORMAL HIGH (ref 0.0–40.0)

## 2012-02-15 LAB — HEMOGLOBIN A1C: Hgb A1c MFr Bld: 6.5 % (ref 4.6–6.5)

## 2012-02-15 LAB — COMPREHENSIVE METABOLIC PANEL
ALT: 16 U/L (ref 0–35)
AST: 22 U/L (ref 0–37)
Albumin: 3.9 g/dL (ref 3.5–5.2)
Alkaline Phosphatase: 59 U/L (ref 39–117)
BUN: 16 mg/dL (ref 6–23)
CO2: 26 mEq/L (ref 19–32)
Calcium: 9.6 mg/dL (ref 8.4–10.5)
Chloride: 101 mEq/L (ref 96–112)
Creatinine, Ser: 0.8 mg/dL (ref 0.4–1.2)
GFR: 77.27 mL/min (ref >60.00)
Glucose, Bld: 122 mg/dL — ABNORMAL HIGH (ref 70–99)
Potassium: 4.3 mEq/L (ref 3.5–5.1)
Sodium: 136 mEq/L (ref 135–145)
Total Bilirubin: 0.4 mg/dL (ref 0.3–1.2)
Total Protein: 7.2 g/dL (ref 6.0–8.3)

## 2012-02-15 LAB — TSH: TSH: 1.91 u[IU]/mL (ref 0.35–5.50)

## 2012-02-15 LAB — MICROALBUMIN / CREATININE URINE RATIO
Creatinine,U: 163.8 mg/dL
Microalb Creat Ratio: 1 mg/g (ref 0.0–30.0)
Microalb, Ur: 1.7 mg/dL (ref 0.0–1.9)

## 2012-02-15 NOTE — Assessment & Plan Note (Signed)
No AAA per u/s

## 2012-02-15 NOTE — Assessment & Plan Note (Signed)
Reports atypical chest pain, EKG today nsr, no acute, no different from previous. She does have CV RF, thus if pain increases or changes will call us for further testing  Pt in agreement

## 2012-02-15 NOTE — Progress Notes (Signed)
  Subjective:    Patient ID: Natalie [name], female    DOB: 1933-02-09, 78 y.o.   MRN: [id number]  HPI  Here for Medicare AWV: 1. Risk factors based on Past M, S, F history: yes  2. Physical Activities: sedentary x 6 months d/t back pain  3. Depression/mood: occasionally feels "down" d/t pain, take OTC "[facility]" prn and  helps , no severe    symptoms, no suicidal  4. Hearing: slightly  decreased L>R, not causing problems was referred but couldn't see an audiologist 5. ADL's:  independent but unable to do heavy home chores, like vacumm 6. Fall Risk: prevention discussed   7. Home Safety: feeels safe at home  8. Height, weight, &visual acuity:, see VS, vision decreased L>R, sees the eye doctor 9. Counseling: yes  10. Labs ordered based on risk factors: yes  11. Referral Coordination: if needed  12. Care Plan: see a/p  13.  Cognitive Assessment , motor skills, memory and cognition seem appropriate  in addition, we discussed the following issues Diabetes, on diet only, working on improving her diet, no ambulatory blood sugars recently Hypertension, good medication compliance, no amb BPs Allergy symptoms well controlled with Flonase. Also complains of occasional chest pain described as a fullness or tightness in the anterior chest, last few seconds, at rest, denies "pressure" type of dyscomfort, no radiation. Symptoms are never exertional. Back pain remains her main problem, sees specialists, had a recent MRI, they're thinking about surgery. Currently taking Mobic, Aleve, Tylenol.  Past Medical History:   HTN   Diabetes Mellitus   hyperlipidenmia   Allergies, remote asthma, sees an allergist h/o COLONIC POLYPS   FEMORAL BRUIT   HYPERCALCEMIA, HYPERPARATHYROIDISM, PRIMARY   Chronic on-off back pain (on mobic as needed), sees Dr Shon Baton   2005: back and pelvic pain, s/p w-u, CT (-), u/s showed a thick endometrium, s/p Bx per gyn   Sees urology for ureteric obstruction   Past  Surgical History:   1989: status post RML lung lobectomy, biopsy showed precancerous changes   1989 thyroid cystectomy,   Colon polypectomy    Social History:   Married, 4 children   Fully retired, Lawyer   tobacco-- never   ETOH-- rarely    Family History:   Rheumatoid Arthritis-- cousins   Diabetes-- B x 2   hypertension-- B, M   brain tumor--M   breast Ca--daughter   colon ca--no   Review of Systems Denies nausea, vomiting, diarrhea. No change in the color of his stools, no epigastric pain. Denies dysuria or gross hematuria at this point Reports tingling/burning of her feet, mostly distally, worse at night.     Objective:   Physical Exam General -- alert, well-developed, and overweight appearing. No apparent distress.  Neck --no thyromegaly  Lungs -- normal respiratory effort, no intercostal retractions, no accessory muscle use, and normal breath sounds.   Heart-- normal rate, regular rhythm, no murmur, and no gallop.   Abdomen--soft, non-tender, no distention, no masses, no HSM, no guarding, and no rigidity.   DIABETIC FEET EXAM: Trace lower extremity edema Normal pedal pulses bilaterally Skin normal and nails thickened Pinprick examination-- decrease sensation in a patchy fashion, mostly at the distal feet Neurologic-- alert & oriented X3 and strength normal in all extremities. Psych-- Cognition and judgment appear intact. Alert and cooperative with normal attention span and concentration.  not anxious appearing and not depressed appearing.      Assessment & Plan:

## 2012-02-15 NOTE — Patient Instructions (Addendum)
If the pain in the chest change, gets worse, more frequent or is brought up  by exertion ---> let me know

## 2012-02-15 NOTE — Assessment & Plan Note (Signed)
No ambulatory BPs, BP today okay, labs

## 2012-02-15 NOTE — Assessment & Plan Note (Signed)
Back pain remains her main issue, followup by Dr. Shon Baton and Dr. Ethelene Hal. She takes NSAIDs and Tylenol, no GI symptoms, apparently tolerates medication well. Will check kidney and renal function.

## 2012-02-15 NOTE — Assessment & Plan Note (Signed)
Multiple medication intolerances, labs livalo? Zetia?

## 2012-02-15 NOTE — Assessment & Plan Note (Signed)
Td 2009 pneumonia shot 04-2005 and 2013 Flu shot today Shingles immunization --2011  Female care per gynecology.   mammogram 9-12 neg  PAPs per gyn   DEXA 11-2006 and 12-2008 normal . Plan-- dexa next year, continue ca and vit D  last colonoscopy 7-11, biopsy showed a tubular adenoma, next per  GI  u/s 05-2011 neg for AAA Diet-discussed

## 2012-02-15 NOTE — Assessment & Plan Note (Addendum)
On diet only, working on improving her diet, unable to exercise much due to back pain Feet exam show neuropathy, gabapentin discuss, not ready to start another medication but will call when ready Plan:  Info about feet care provided  Check a hemoglobin A1c

## 2012-02-16 LAB — LDL CHOLESTEROL, DIRECT: Direct LDL: 136.8 mg/dL

## 2012-02-17 ENCOUNTER — Encounter: Payer: Self-pay | Admitting: *Deleted

## 2012-03-21 ENCOUNTER — Other Ambulatory Visit: Payer: Self-pay | Admitting: Internal Medicine

## 2012-03-21 NOTE — Telephone Encounter (Signed)
Refill done.  

## 2012-07-19 ENCOUNTER — Other Ambulatory Visit: Payer: Self-pay | Admitting: Internal Medicine

## 2012-07-19 NOTE — Telephone Encounter (Signed)
Refill done.  

## 2012-08-15 ENCOUNTER — Ambulatory Visit (INDEPENDENT_AMBULATORY_CARE_PROVIDER_SITE_OTHER): Payer: Medicare Other | Admitting: Internal Medicine

## 2012-08-15 ENCOUNTER — Encounter: Payer: Self-pay | Admitting: Internal Medicine

## 2012-08-15 VITALS — BP 128/72 | HR 71 | Temp 98.1°F | Wt 200.0 lb

## 2012-08-15 DIAGNOSIS — M549 Dorsalgia, unspecified: Secondary | ICD-10-CM

## 2012-08-15 DIAGNOSIS — R079 Chest pain, unspecified: Secondary | ICD-10-CM

## 2012-08-15 DIAGNOSIS — I10 Essential (primary) hypertension: Secondary | ICD-10-CM

## 2012-08-15 DIAGNOSIS — E119 Type 2 diabetes mellitus without complications: Secondary | ICD-10-CM

## 2012-08-15 DIAGNOSIS — N390 Urinary tract infection, site not specified: Secondary | ICD-10-CM

## 2012-08-15 DIAGNOSIS — E785 Hyperlipidemia, unspecified: Secondary | ICD-10-CM

## 2012-08-15 LAB — BASIC METABOLIC PANEL
BUN: 16 mg/dL (ref 6–23)
CO2: 26 mEq/L (ref 19–32)
Calcium: 9.6 mg/dL (ref 8.4–10.5)
Chloride: 103 mEq/L (ref 96–112)
Creatinine, Ser: 0.8 mg/dL (ref 0.4–1.2)
GFR: 74.92 mL/min (ref >60.00)
Glucose, Bld: 109 mg/dL — ABNORMAL HIGH (ref 70–99)
Potassium: 4.7 mEq/L (ref 3.5–5.1)
Sodium: 140 mEq/L (ref 135–145)

## 2012-08-15 LAB — HEMOGLOBIN A1C: Hgb A1c MFr Bld: 6.3 % (ref 4.6–6.5)

## 2012-08-15 NOTE — Patient Instructions (Signed)
Elevate your legs  twice a day x 45 minutes  Try to avoid excessive Aleve. Don't take more than 3 or 4 g of acetaminophen (Vicodin, Tylenol) a day

## 2012-08-15 NOTE — Assessment & Plan Note (Signed)
Resolved

## 2012-08-15 NOTE — Assessment & Plan Note (Signed)
We spent the majority of this 25 min visit discussing back pain: --sx are ongoing, sees Dr Ethelene Hal and surgery, offered surgery but "my surgeon is not enthusiastic about it" -- thinking about a second opinion which i think is a great idea -- worried about narcotic use, told her that is ok under supervision -- warned about GI s/e of NSAIDs and overuse of acetominophen (she was well aware of this issue)

## 2012-08-15 NOTE — Assessment & Plan Note (Addendum)
Well controlled but has developed edema (d/t inactivity? Aleve?) Recommend to increase lasix from 20 to 30 mg (declined 40 mg) Elevate leg Cont low salt diet  Hold-minimize aleve intake

## 2012-08-15 NOTE — Assessment & Plan Note (Signed)
Reluctant to take meds

## 2012-08-15 NOTE — Progress Notes (Signed)
  Subjective:    Patient ID: Natalie [name], female    DOB: 1933/02/14, 79 y.o.   MRN: [id number]  HPI Routine office visit Back pain, a  ongoing issue, we had a long discussion about it. See assessment and plan. Diabetes, ambulatory blood sugars 113 the morning, slightly high in in the afternoon around 150. High cholesterol, labs were reviewed, she's still not interested in any medication.  Past Medical History  Diagnosis Date  . HTN (hypertension)   . Diabetes mellitus   . Hyperparathyroidism   . Back pain, chronic   . Pelvic pain in female 2005     back and pelvic pain, s/p w-u, CT (-), u/s showed a thick endometrium, s/p Bx per gyn  . Ureteric obstruction     hydronephrosis,sees urology  . Hyperlipidemia   . BCC (basal cell carcinoma of skin)     several, nose , 2012 L forehead    Past Surgical History  Procedure Laterality Date  . Lobectomy  1989    s/p  RML lung lobectomy, biopsy showed precancerous changes   . Cystectomy  1989    thyriod  . 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 She recognizes is less active than before and is taking advil from time to time. Was seen with chest pain, CP resolved, denies shortness or breath or orthopnea. Denies nausea, vomiting, diarrhea or change in the color of the stools. Some dry skin.     Objective:   Physical Exam General -- alert, well-developed Lungs -- normal respiratory effort, no intercostal retractions, no accessory muscle use, and normal breath sounds.   Heart-- normal rate, regular rhythm, no murmur, and no gallop.   Extremities--trace edema around the ankles   Neurologic-- alert & oriented X3 and strength normal in all extremities. Psych-- Cognition and judgment appear intact. Alert and cooperative with normal attention span and concentration.  not anxious appearing and not depressed appearing.      Assessment & Plan:

## 2012-08-15 NOTE — Assessment & Plan Note (Signed)
Due for labs

## 2012-08-16 ENCOUNTER — Ambulatory Visit: Payer: Medicare Other | Admitting: Internal Medicine

## 2012-08-17 ENCOUNTER — Encounter: Payer: Self-pay | Admitting: *Deleted

## 2012-08-18 LAB — URINE CULTURE: Colony Count: >=100000

## 2012-08-22 ENCOUNTER — Telehealth: Payer: Self-pay | Admitting: *Deleted

## 2012-08-22 MED ORDER — CIPROFLOXACIN HCL 500 MG PO TABS: 500.0000 mg | ORAL_TABLET | Freq: Two times a day (BID) | ORAL | Status: DC

## 2012-08-22 NOTE — Telephone Encounter (Signed)
Discuss with patient, Rx sent. 

## 2012-08-22 NOTE — Telephone Encounter (Signed)
Message copied by Verdene Rio on Tue Aug 22, 2012 12:32 PM ------      Message from: Wanda Plump      Created: Sat Aug 19, 2012  6:09 PM       Has an  asymptomatic UTI.      Call Cipro 500 mg one tablet twice a day #14 no refills.      Advise patient to take the medication and bring a urine sample in one month, UCX DX UTI ------

## 2012-09-19 ENCOUNTER — Other Ambulatory Visit (INDEPENDENT_AMBULATORY_CARE_PROVIDER_SITE_OTHER): Payer: 59

## 2012-09-19 DIAGNOSIS — N390 Urinary tract infection, site not specified: Secondary | ICD-10-CM

## 2012-09-19 LAB — POCT URINALYSIS DIPSTICK
Bilirubin, UA: NEGATIVE
Blood, UA: NEGATIVE
Glucose, UA: NEGATIVE
Ketones, UA: NEGATIVE
Nitrite, UA: NEGATIVE
Protein, UA: NEGATIVE
Spec Grav, UA: <=1.005
Urobilinogen, UA: 0.2
pH, UA: 6

## 2012-09-20 LAB — URINE CULTURE
Colony Count: NO GROWTH
Organism ID, Bacteria: NO GROWTH

## 2012-09-22 ENCOUNTER — Encounter: Payer: Self-pay | Admitting: *Deleted

## 2012-11-10 ENCOUNTER — Telehealth: Payer: Self-pay | Admitting: Internal Medicine

## 2012-12-01 ENCOUNTER — Other Ambulatory Visit: Payer: Self-pay | Admitting: Internal Medicine

## 2012-12-01 NOTE — Telephone Encounter (Signed)
I changed sig for lasix to 30 mg qd Call if CBGs increase to > 150 Otherwise f/u by October as recommended

## 2012-12-01 NOTE — Telephone Encounter (Signed)
Spoke with patient, she states she is taking 1 1/2 tablets daily (30 mg total). Requested refill for 20 mg daily. Wanted to clarify correct dosing prior to completing refill. Also pt. States she is still having swelling and her blood sugars have been consistently in the 130's and want to know if she needs an OV. Please advise. Thanks.

## 2012-12-01 NOTE — Telephone Encounter (Signed)
Refill done per orders. lmovm to return call to office regarding her blood sugars.

## 2012-12-08 ENCOUNTER — Ambulatory Visit (INDEPENDENT_AMBULATORY_CARE_PROVIDER_SITE_OTHER): Payer: 59 | Admitting: Internal Medicine

## 2012-12-08 VITALS — BP 138/80 | HR 83 | Temp 98.5°F | Wt 195.4 lb

## 2012-12-08 DIAGNOSIS — M549 Dorsalgia, unspecified: Secondary | ICD-10-CM

## 2012-12-08 DIAGNOSIS — E119 Type 2 diabetes mellitus without complications: Secondary | ICD-10-CM

## 2012-12-08 LAB — HEMOGLOBIN A1C
Hgb A1c MFr Bld: 6 % — ABNORMAL HIGH (ref <5.7)
Mean Plasma Glucose: 126 mg/dL — ABNORMAL HIGH (ref <117)

## 2012-12-08 NOTE — Assessment & Plan Note (Signed)
Patient here concerned about her CBGs, morning CBGs fasting in the 130s, that is probably consistent with a A1c of 6.5. Concept of hemoglobin A1c and appropriate CBG goals discussed. She is on no medications, Has improved her diet and lost some weight. Plan: Check the A1c, consider medication if needed.

## 2012-12-08 NOTE — Patient Instructions (Addendum)
Next visit in 3 months  

## 2012-12-08 NOTE — Progress Notes (Signed)
  Subjective:    Patient ID: Natalie [name], female    DOB: June 11, 1932, 79 y.o.   MRN: [id number]  HPI Acute visit Patient is concerned about her blood sugars, in the last few weeks her morning CBG has been around 130, 135 .Concerned because she thinks is too high. She continue with back pain, she is contemplating surgery in likes to be sure her blood sugar is okay.  Past Medical History  Diagnosis Date  . HTN (hypertension)   . Diabetes mellitus   . Hyperparathyroidism   . Back pain, chronic   . Pelvic pain in female 2005     back and pelvic pain, s/p w-u, CT (-), u/s showed a thick endometrium, s/p Bx per gyn  . Ureteric obstruction     hydronephrosis,sees urology  . Hyperlipidemia   . BCC (basal cell carcinoma of skin)     several, nose , 2012 L forehead   Past Surgical History  Procedure Laterality Date  . Lobectomy  1989    s/p  RML lung lobectomy, biopsy showed precancerous changes   . Cystectomy  1989    thyriod  . 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 Denies polyuria, she has been very thirsty ("all my life"). Continue with edema, worse at the end of the day, slightly more noticeable on the right leg.    Objective:   Physical Exam BP 138/80  Pulse 83  Temp(Src) 98.5 F (36.9 C) (Oral)  Wt 195 lb 6.4 oz (88.633 kg)  BMI 31.78 kg/m2  SpO2 94%  General -- alert, well-developed, NA Extremities-- trace periankle edema, slt more on the R  Neurologic-- alert & oriented X3 and strength normal in all extremities. Psych-- Cognition and judgment appear intact. Alert and cooperative with normal attention span and concentration.  not anxious appearing and not depressed appearing.         Assessment & Plan:

## 2012-12-08 NOTE — Assessment & Plan Note (Signed)
Ongoing problem, her back doctor is considering surgery

## 2012-12-10 ENCOUNTER — Encounter: Payer: Self-pay | Admitting: Internal Medicine

## 2012-12-21 ENCOUNTER — Other Ambulatory Visit: Payer: Self-pay | Admitting: Neurological Surgery

## 2012-12-22 ENCOUNTER — Ambulatory Visit: Payer: 59 | Admitting: Internal Medicine

## 2012-12-27 ENCOUNTER — Telehealth: Payer: Self-pay | Admitting: Internal Medicine

## 2012-12-27 DIAGNOSIS — Z01810 Encounter for preprocedural cardiovascular examination: Secondary | ICD-10-CM

## 2012-12-27 NOTE — Telephone Encounter (Signed)
done

## 2012-12-27 NOTE — Telephone Encounter (Signed)
Would you like this order to be for an exercise stress test or nuclear medicine study? Please advise.

## 2012-12-27 NOTE — Telephone Encounter (Signed)
Neurosurgery requests surgical clearance. Please arrange a stress test---dxX surgical clearance, hypertension, diabetes If they need a CBC and BMP before stress test please do Arrange for office visit immediately after the stress test.

## 2012-12-28 NOTE — Telephone Encounter (Signed)
Discussed with patient, verbalized understanding.  

## 2013-01-05 ENCOUNTER — Telehealth: Payer: Self-pay | Admitting: Internal Medicine

## 2013-01-05 ENCOUNTER — Other Ambulatory Visit: Payer: Self-pay | Admitting: Internal Medicine

## 2013-01-05 NOTE — Telephone Encounter (Signed)
Notified [facility] pharmacy of dx codes for the two prescriptions-lancets and test strips per pharmacy request.

## 2013-01-05 NOTE — Telephone Encounter (Signed)
Refill done by protocol. 

## 2013-01-05 NOTE — Telephone Encounter (Signed)
[facility] pharmacy needs the dx codes for the two prescriptions that were just sent over. Please call.

## 2013-01-09 ENCOUNTER — Encounter: Payer: Self-pay | Admitting: Internal Medicine

## 2013-01-12 ENCOUNTER — Telehealth: Payer: Self-pay | Admitting: Internal Medicine

## 2013-01-12 NOTE — Telephone Encounter (Signed)
Patient called because she was concern about with her stress test being at 12:00 and it being 3hours. Did dr Drue Novel mind seeing her on Wednesday and then go over the stress test because she doesnt think she is going to make here at 3:30 for her appointment.

## 2013-01-12 NOTE — Telephone Encounter (Signed)
Patient states she moved her stress test to 9/15 and that Dr. Drue Novel wanted to see her on the same day. Dr. Drue Novel is not seeing patients that week. Patient would like to know what she should do.

## 2013-01-16 ENCOUNTER — Ambulatory Visit: Payer: 59 | Admitting: Internal Medicine

## 2013-01-16 ENCOUNTER — Encounter (HOSPITAL_COMMUNITY): Payer: 59

## 2013-01-16 NOTE — Telephone Encounter (Signed)
Appt scheduled for 2:30 on 01/29/13

## 2013-01-16 NOTE — Telephone Encounter (Signed)
Patient wants to speak with Dois Davenport about her appointment with Dr. Drue Novel. Her appointment is scheduled for 02/13/13 because Dr. Drue Novel is not seeing patient's on 9/15. She says that she was told she could be worked in on 9/15, but I told her I do not know anything about her being worked in. Please advise.

## 2013-01-17 NOTE — Telephone Encounter (Signed)
As soon as possible after the RIE

## 2013-01-17 NOTE — Telephone Encounter (Signed)
Patients stress test is scheduled for 9/15, you will be involved the the first RIE for [facility] that week and will not be seeing patients. When do you want to see this patient after her stress test? Please advise so that we can schedule.

## 2013-01-17 NOTE — Telephone Encounter (Signed)
Appt scheduled for 02/05/13 at 11:15am, pt aware.

## 2013-01-19 ENCOUNTER — Encounter: Payer: Self-pay | Admitting: Cardiovascular Disease

## 2013-01-29 ENCOUNTER — Ambulatory Visit (HOSPITAL_COMMUNITY): Payer: Medicare Other | Attending: Cardiology | Admitting: Radiology

## 2013-01-29 VITALS — BP 151/70 | Ht 66.0 in | Wt 196.0 lb

## 2013-01-29 DIAGNOSIS — E119 Type 2 diabetes mellitus without complications: Secondary | ICD-10-CM | POA: Diagnosis not present

## 2013-01-29 DIAGNOSIS — Z8249 Family history of ischemic heart disease and other diseases of the circulatory system: Secondary | ICD-10-CM | POA: Diagnosis not present

## 2013-01-29 DIAGNOSIS — Z01810 Encounter for preprocedural cardiovascular examination: Secondary | ICD-10-CM

## 2013-01-29 DIAGNOSIS — I10 Essential (primary) hypertension: Secondary | ICD-10-CM | POA: Diagnosis not present

## 2013-01-29 DIAGNOSIS — R0989 Other specified symptoms and signs involving the circulatory and respiratory systems: Secondary | ICD-10-CM | POA: Insufficient documentation

## 2013-01-29 DIAGNOSIS — R5381 Other malaise: Secondary | ICD-10-CM | POA: Diagnosis not present

## 2013-01-29 DIAGNOSIS — R0602 Shortness of breath: Secondary | ICD-10-CM

## 2013-01-29 DIAGNOSIS — R0609 Other forms of dyspnea: Secondary | ICD-10-CM | POA: Diagnosis present

## 2013-01-29 MED ORDER — AMINOPHYLLINE 25 MG/ML IV SOLN
75.0000 mg | Freq: Once | INTRAVENOUS | Status: AC
  Administered 2013-01-29: 75 mg via INTRAVENOUS

## 2013-01-29 MED ORDER — REGADENOSON 0.4 MG/5ML IV SOLN
0.4000 mg | Freq: Once | INTRAVENOUS | Status: AC
  Administered 2013-01-29: 0.4 mg via INTRAVENOUS

## 2013-01-29 MED ORDER — TECHNETIUM TC 99M SESTAMIBI GENERIC - CARDIOLITE
30.0000 | Freq: Once | INTRAVENOUS | Status: AC | PRN
  Administered 2013-01-29: 30 via INTRAVENOUS

## 2013-01-29 MED ORDER — TECHNETIUM TC 99M SESTAMIBI GENERIC - CARDIOLITE
10.0000 | Freq: Once | INTRAVENOUS | Status: AC | PRN
  Administered 2013-01-29: 10 via INTRAVENOUS

## 2013-01-29 NOTE — Progress Notes (Signed)
[facility] SITE 3 NUCLEAR MED [address] [place], [place] [address] [phone]    Cardiology Nuclear Med Study  Natalie [name] is a 79 y.o. female     MRN : [id number]     DOB: Apr 27, 1933  Procedure Date: 01/29/2013  Nuclear Med Background Indication for Stress Test:  Evaluation for Ischemia, and Surgical Clearance for  Back Surgery by Dr. Marikay Alar (Neuro surgeon)  History: No prior known history of CAD, Asthma, Abnormal EKG,and 11-02-01 Myocardial Perfusion Study-Normal, EF=65% Cardiac Risk Factors: Family History - CAD, Hypertension, Lipids and NIDDM  Symptoms:  DOE and Fatigue   Nuclear Pre-Procedure Caffeine/Decaff Intake:  None > 12 hrs NPO After: 9:00pm   Lungs:  clear O2 Sat: 95% on room air. IV 0.9% NS with Angio Cath:  22g  IV Site: R Forearm  X 1, tolerated well IV Started by:  Irean Hong, RN  Chest Size (in):  36 Cup Size: C  Height: 5\' 6"  (1.676 m)  Weight:  196 lb (88.905 kg)  BMI:  Body mass index is 31.65 kg/(m^2). Tech Comments:  Lisinopril this am    Nuclear Med Study 1 or 2 day study: 1 day  Stress Test Type:  Lexiscan  Reading MD: Olga Millers, MD  Order Authorizing Provider:  Willow Ora, MD  Resting Radionuclide: Technetium 48m Sestamibi  Resting Radionuclide Dose: 11.0 mCi   Stress Radionuclide:  Technetium 106m Sestamibi  Stress Radionuclide Dose: 33.0 mCi           Stress Protocol Rest HR: 69 Stress HR: 93  Rest BP: 151/70 Stress BP: 168/67  Exercise Time (min): n/a METS: n/a   Predicted Max HR: 143 bpm % Max HR: 65.03 bpm Rate Pressure Product: 21308   Dose of Adenosine (mg):  n/a Dose of Lexiscan: 0.4 mg  Dose of Atropine (mg): n/a Dose of Dobutamine: n/a mcg/kg/min (at max HR)  Stress Test Technologist: Milana Na, EMT-P  Nuclear Technologist:  Doyne Keel, CNMT     Rest Procedure:  Myocardial perfusion imaging was performed at rest 45 minutes following the intravenous administration of Technetium 55m  Sestamibi. Rest ECG: NSR - Normal EKG  Stress Procedure:  The patient received IV Lexiscan 0.4 mg over 15-seconds.  Technetium 84m Sestamibi injected at 30-seconds. This patient had sob, a hot flash, a headache, and was lt. Headed with the Lexiscan injection. Quantitative spect images were obtained after a 45 minute delay. Stress ECG: No significant ST segment change suggestive of ischemia.  QPS Raw Data Images:  Acquisition technically good; normal left ventricular size. Stress Images:  There is decreased uptake in the anterior wall. Rest Images:  There is decreased uptake in the anterior wall. Subtraction (SDS):  No evidence of ischemia. Transient Ischemic Dilatation (Normal <1.22):  n/a Lung/Heart Ratio (Normal <0.45):  0.27  Quantitative Gated Spect Images QGS EDV:  70 ml QGS ESV:  17 ml  Impression Exercise Capacity:  Lexiscan with no exercise. BP Response:  Normal blood pressure response. Clinical Symptoms:  There is dyspnea. ECG Impression:  No significant ST segment change suggestive of ischemia. Comparison with Prior Nuclear Study: No significant change compared to 2003.  Overall Impression:  Low risk stress nuclear study with a small, fixed, anterior defect suggestive of soft tissue attenuation; no ischemia.  LV Ejection Fraction: 76%.  LV Wall Motion:  NL LV Function; NL Wall Motion  Olga Millers

## 2013-01-30 ENCOUNTER — Telehealth: Payer: Self-pay | Admitting: Internal Medicine

## 2013-01-30 NOTE — Telephone Encounter (Signed)
Advise patient, her stress test is "low risk" meaning no current evidence of heart disease. Good results.

## 2013-01-30 NOTE — Telephone Encounter (Signed)
Pt notified of stress test results. Pt understands and has no further questions. DJR

## 2013-01-31 ENCOUNTER — Other Ambulatory Visit: Payer: Self-pay | Admitting: Neurological Surgery

## 2013-01-31 DIAGNOSIS — M810 Age-related osteoporosis without current pathological fracture: Secondary | ICD-10-CM

## 2013-02-05 ENCOUNTER — Ambulatory Visit (INDEPENDENT_AMBULATORY_CARE_PROVIDER_SITE_OTHER): Payer: 59 | Admitting: Internal Medicine

## 2013-02-05 ENCOUNTER — Encounter: Payer: Self-pay | Admitting: Internal Medicine

## 2013-02-05 VITALS — BP 134/78 | HR 76 | Temp 98.7°F | Wt 198.0 lb

## 2013-02-05 DIAGNOSIS — M549 Dorsalgia, unspecified: Secondary | ICD-10-CM

## 2013-02-05 DIAGNOSIS — Z01818 Encounter for other preprocedural examination: Secondary | ICD-10-CM | POA: Insufficient documentation

## 2013-02-05 DIAGNOSIS — I10 Essential (primary) hypertension: Secondary | ICD-10-CM

## 2013-02-05 LAB — BASIC METABOLIC PANEL
BUN: 13 mg/dL (ref 6–23)
CO2: 27 mEq/L (ref 19–32)
Calcium: 9.8 mg/dL (ref 8.4–10.5)
Chloride: 102 mEq/L (ref 96–112)
Creatinine, Ser: 0.7 mg/dL (ref 0.4–1.2)
GFR: 92.08 mL/min (ref >60.00)
Glucose, Bld: 92 mg/dL (ref 70–99)
Potassium: 4.4 mEq/L (ref 3.5–5.1)
Sodium: 138 mEq/L (ref 135–145)

## 2013-02-05 LAB — AST: AST: 20 U/L (ref 0–37)

## 2013-02-05 LAB — CBC WITH DIFFERENTIAL/PLATELET
Basophils Absolute: 0.1 10*3/uL (ref 0.0–0.1)
Basophils Relative: 1.1 % (ref 0.0–3.0)
Eosinophils Absolute: 0.3 10*3/uL (ref 0.0–0.7)
Eosinophils Relative: 3.1 % (ref 0.0–5.0)
HCT: 40 % (ref 36.0–46.0)
Hemoglobin: 13.3 g/dL (ref 12.0–15.0)
Lymphocytes Relative: 32.6 % (ref 12.0–46.0)
Lymphs Abs: 3 10*3/uL (ref 0.7–4.0)
MCHC: 33.3 g/dL (ref 30.0–36.0)
MCV: 88.9 fl (ref 78.0–100.0)
Monocytes Absolute: 0.8 10*3/uL (ref 0.1–1.0)
Monocytes Relative: 9 % (ref 3.0–12.0)
Neutro Abs: 5 10*3/uL (ref 1.4–7.7)
Neutrophils Relative %: 54.2 % (ref 43.0–77.0)
Platelets: 277 10*3/uL (ref 150.0–400.0)
RBC: 4.5 Mil/uL (ref 3.87–5.11)
RDW: 13.5 % (ref 11.5–14.6)
WBC: 9.3 10*3/uL (ref 4.5–10.5)

## 2013-02-05 LAB — ALT: ALT: 15 U/L (ref 0–35)

## 2013-02-05 NOTE — Assessment & Plan Note (Signed)
Controlled, she does have some lower extremity edema. Plan: Increase Lasix from 30 mg to 40 mg Leg elevation Low-salt diet Return to the office in 3 months

## 2013-02-05 NOTE — Patient Instructions (Signed)
Increase Lasix 20 mg to 2 tablets every day Elevated legs 30 minutes 2 times a day, watch your salt intake. Do not take more than 3000 mg of Tylenol a day, keep in mind that every tablet of hydrocodone has 350 mg of Tylenol on it. Next visit in 3 months

## 2013-02-05 NOTE — Assessment & Plan Note (Addendum)
Needs surgery, she is cleared, see preop assessment. Takes Tylenol and Norco, warned about over taking  tylenol, See instructions. She is date is trying to limit to 2000 mg/day

## 2013-02-05 NOTE — Assessment & Plan Note (Signed)
Patient needs lower back surgery, other than edema there is no suggestion of cardiopulmonary problems on clinical grounds. Recent stress test low risk. Patient is clear for surgery, will do a CBC, BMP, LFTs. Will try to improve her edema, see hypertension

## 2013-02-05 NOTE — Progress Notes (Signed)
  Subjective:    Patient ID: Natalie [name], female    DOB: 03-25-1933, 79 y.o.   MRN: [id number]  HPI Here to discuss preop clearance for back surgery. Currently managing pain with Tylenol and Norco. Hypertension, good medication compliance, ambulatory BPs 135/86 History of bronchospasm, only wheezes whenever she has a cold.   Past Medical History  Diagnosis Date  . HTN (hypertension)   . Diabetes mellitus   . Hyperparathyroidism   . Back pain, chronic   . Pelvic pain in female 2005     back and pelvic pain, s/p w-u, CT (-), u/s showed a thick endometrium, s/p Bx per gyn  . Ureteric obstruction     hydronephrosis,sees urology  . Hyperlipidemia   . BCC (basal cell carcinoma of skin)     several, nose , 2012 L forehead   Past Surgical History  Procedure Laterality Date  . Lobectomy  1989    s/p  RML lung lobectomy, biopsy showed precancerous changes   . Cystectomy  1989    thyriod  . Polypectomy      colon   History   Social History  . Marital Status: Married    Spouse Name: N/A    Number of Children: 4  . Years of Education: N/A   Occupational History  . retired Lawyer    Social History Main Topics  . Smoking status: Never Smoker   . Smokeless tobacco: Never Used  . Alcohol Use: Yes     Comment: Rarely  . Drug Use: No  . Sexual Activity: Not on file   Other Topics Concern  . Not on file   Social History Narrative    Lives w/ husband               Review of Systems Exercise-- no routine exercise, does  home chores w/o problems  No  CP, SOB, + lower extremity edema at the end of the day No orthopnea  No nausea, vomiting diarrhea No blood in the stools No dysuria, gross hematuria, difficulty urinating   No anxiety, depression      Objective:   Physical Exam BP 134/78  Pulse 76  Temp(Src) 98.7 F (37.1 C) (Oral)  Wt 198 lb (89.812 kg)  BMI 31.97 kg/m2 General -- alert, well-developed, NAD.  Neck --no JVD at 45 HEENT-- Not pale.    Lungs -- normal respiratory effort, no intercostal retractions, no accessory muscle use, and normal breath sounds.  Heart-- normal rate, regular rhythm, no murmur.  Abdomen-- Not distended, good bowel sounds,soft, non-tender. Extremities-- symmetric trace  pretibial edema Neurologic--  alert & oriented X3.  Speech normal, gait normal, strength normal in all extremities.  Psych-- Cognition and judgment appear intact. Cooperative with normal attention span and concentration. No anxious appearing , no depressed appearing.       Assessment & Plan:

## 2013-02-07 ENCOUNTER — Telehealth: Payer: Self-pay | Admitting: *Deleted

## 2013-02-07 ENCOUNTER — Encounter: Payer: Self-pay | Admitting: *Deleted

## 2013-02-07 NOTE — Telephone Encounter (Signed)
Letter sent. DJR  

## 2013-02-07 NOTE — Telephone Encounter (Signed)
Message copied by Eustace Quail on Wed Feb 07, 2013  8:05 AM ------      Message from: Willow Ora E      Created: Tue Feb 06, 2013  4:43 PM       Send a letter      Stephanie Coup      Your potassium, kidney and liver tests are normal. Your blood count is normal, no anemia.      These are good results.       ------

## 2013-02-13 ENCOUNTER — Ambulatory Visit: Payer: 59 | Admitting: Internal Medicine

## 2013-02-14 ENCOUNTER — Telehealth: Payer: Self-pay | Admitting: Internal Medicine

## 2013-02-14 DIAGNOSIS — Z13820 Encounter for screening for osteoporosis: Secondary | ICD-10-CM

## 2013-02-14 NOTE — Addendum Note (Signed)
Addended by: Willow Ora E on: 02/14/2013 02:52 PM   Modules accepted: Orders

## 2013-02-14 NOTE — Telephone Encounter (Signed)
Pt notified via tele. DJR  

## 2013-02-14 NOTE — Telephone Encounter (Signed)
Olegario Messier, pt's daughter-in-law is calling to see if Dr. Drue Novel will put an order in for the patient to have a Bone Density. She states that Dr. Barnett [facility] office is requesting this but every time they call over to his office asking for the bone density order to get put in they get an excuse. Please advise.

## 2013-02-14 NOTE — Telephone Encounter (Addendum)
I enter the order, tell pt to callus  if she does not get a phone call from a scheduling in 2-3 days

## 2013-02-21 ENCOUNTER — Ambulatory Visit (INDEPENDENT_AMBULATORY_CARE_PROVIDER_SITE_OTHER)
Admission: RE | Admit: 2013-02-21 | Discharge: 2013-02-21 | Disposition: A | Discharge status: Home / Self Care | Payer: 59 | Source: Ambulatory Visit | Attending: Internal Medicine | Admitting: Internal Medicine

## 2013-02-21 DIAGNOSIS — Z13820 Encounter for screening for osteoporosis: Secondary | ICD-10-CM

## 2013-03-16 ENCOUNTER — Other Ambulatory Visit: Payer: Self-pay | Admitting: Internal Medicine

## 2013-03-16 NOTE — Telephone Encounter (Signed)
Lisinopril refill sent to pharmacy 

## 2013-04-02 ENCOUNTER — Telehealth: Payer: Self-pay

## 2013-04-02 NOTE — Telephone Encounter (Signed)
Medication and allergies: updated and reviewed   90 day supply/mail order: na Local pharmacy: [facility]   Immunizations due: UTD  A/P:   No changes to FH or PSH  Tdap 2009 pneumonia shot 05/2011, 04/2005 Flu 02/2013 Shingles--01/2010 [facility]CS--11/2009--next due--2016--Dr Juanda Chance Dexa--02/2013 [facility]--12/2012  To Discuss with Provider: Glucose readings Carpal tunnel surgery soon

## 2013-04-03 ENCOUNTER — Encounter: Payer: Self-pay | Admitting: Internal Medicine

## 2013-04-03 ENCOUNTER — Ambulatory Visit (INDEPENDENT_AMBULATORY_CARE_PROVIDER_SITE_OTHER): Payer: 59 | Admitting: Internal Medicine

## 2013-04-03 VITALS — BP 129/74 | HR 90 | Temp 98.0°F | Ht 66.0 in | Wt 198.0 lb

## 2013-04-03 DIAGNOSIS — Z0000 Encounter for general adult medical examination without abnormal findings: Secondary | ICD-10-CM

## 2013-04-03 DIAGNOSIS — E119 Type 2 diabetes mellitus without complications: Secondary | ICD-10-CM

## 2013-04-03 DIAGNOSIS — I10 Essential (primary) hypertension: Secondary | ICD-10-CM

## 2013-04-03 DIAGNOSIS — M549 Dorsalgia, unspecified: Secondary | ICD-10-CM

## 2013-04-03 NOTE — Assessment & Plan Note (Addendum)
Td 2009 pneumonia shot 04-2005 and 2013 Had the Flu shot  Shingles immunization --2011  Female care per gynecology.  mammogram 12/2012  neg . PAPs per gyn   DEXA 11-2006 , 12-2008 and 02-2013 -----> normal.  Plan-- continue ca and vit D  last colonoscopy 7-11, biopsy showed a tubular adenoma, next 5 years per GI letter   u/s 05-2011 neg for AAA Diet-discussed

## 2013-04-03 NOTE — Progress Notes (Signed)
Pre visit review using our clinic review tool, if applicable. No additional management support is needed unless otherwise documented below in the visit note. 

## 2013-04-03 NOTE — Assessment & Plan Note (Addendum)
Has a h/o  neuropathy, feet care dicussed Due to see ophtalmology Labs

## 2013-04-03 NOTE — Assessment & Plan Note (Addendum)
Good medication compliance with Lasix, lisinopril. Recent BMP within normal. Occasionally BPs elevated, continue ambulatory checking

## 2013-04-03 NOTE — Patient Instructions (Signed)
Get your blood work before you leave  Next visit in 6 months  for a  diabetes hypertension  follow up (30 minutes) No Fasting Please make an appointment    Check the  blood pressure 2 or 3 times a  week be sure it is between 110/60 and 140/85. Ideal blood pressure is 120/80. If it is consistently higher or lower, let me know   Fall Prevention and Home Safety Falls cause injuries and can affect all age groups. It is possible to use preventive measures to significantly decrease the likelihood of falls. There are many simple measures which can make your home safer and prevent falls. OUTDOORS  Repair cracks and edges of walkways and driveways.  Remove high doorway thresholds.  Trim shrubbery on the main path into your home.  Have good outside lighting.  Clear walkways of tools, rocks, debris, and clutter.  Check that handrails are not broken and are securely fastened. Both sides of steps should have handrails.  Have leaves, snow, and ice cleared regularly.  Use sand or salt on walkways during winter months.  In the garage, clean up grease or oil spills. BATHROOM  Install night lights.  Install grab bars by the toilet and in the tub and shower.  Use non-skid mats or decals in the tub or shower.  Place a plastic non-slip stool in the shower to sit on, if needed.  Keep floors dry and clean up all water on the floor immediately.  Remove soap buildup in the tub or shower on a regular basis.  Secure bath mats with non-slip, double-sided rug tape.  Remove throw rugs and tripping hazards from the floors. BEDROOMS  Install night lights.  Make sure a bedside light is easy to reach.  Do not use oversized bedding.  Keep a telephone by your bedside.  Have a firm chair with side arms to use for getting dressed.  Remove throw rugs and tripping hazards from the floor. KITCHEN  Keep handles on pots and pans turned toward the center of the stove. Use back burners when  possible.  Clean up spills quickly and allow time for drying.  Avoid walking on wet floors.  Avoid hot utensils and knives.  Position shelves so they are not too high or low.  Place commonly used objects within easy reach.  If necessary, use a sturdy step stool with a grab bar when reaching.  Keep electrical cables out of the way.  Do not use floor polish or wax that makes floors slippery. If you must use wax, use non-skid floor wax.  Remove throw rugs and tripping hazards from the floor. STAIRWAYS  Never leave objects on stairs.  Place handrails on both sides of stairways and use them. Fix any loose handrails. Make sure handrails on both sides of the stairways are as long as the stairs.  Check carpeting to make sure it is firmly attached along stairs. Make repairs to worn or loose carpet promptly.  Avoid placing throw rugs at the top or bottom of stairways, or properly secure the rug with carpet tape to prevent slippage. Get rid of throw rugs, if possible.  Have an electrician put in a light switch at the top and bottom of the stairs. OTHER FALL PREVENTION TIPS  Wear low-heel or rubber-soled shoes that are supportive and fit well. Wear closed toe shoes.  When using a stepladder, make sure it is fully opened and both spreaders are firmly locked. Do not climb a closed stepladder.  Add  color or contrast paint or tape to grab bars and handrails in your home. Place contrasting color strips on first and last steps.  Learn and use mobility aids as needed. Install an electrical emergency response system.  Turn on lights to avoid dark areas. Replace light bulbs that burn out immediately. Get light switches that glow.  Arrange furniture to create clear pathways. Keep furniture in the same place.  Firmly attach carpet with non-skid or double-sided tape.  Eliminate uneven floor surfaces.  Select a carpet pattern that does not visually hide the edge of steps.  Be aware of all  pets. OTHER HOME SAFETY TIPS  Set the water temperature for 120 F (48.8 C).  Keep emergency numbers on or near the telephone.  Keep smoke detectors on every level of the home and near sleeping areas. Document Released: 04/23/2002 Document Revised: 11/02/2011 Document Reviewed: 07/23/2011 [facility] Patient Information 2014 [place], [place].    Diabetes and Foot Care Diabetes may cause you to have problems because of poor blood supply (circulation) to your feet and legs. This may cause the skin on your feet to become thinner, break easier, and heal more slowly. Your skin may become dry, and the skin may peel and crack. You may also have nerve damage in your legs and feet causing decreased feeling in them. You may not notice minor injuries to your feet that could lead to infections or more serious problems. Taking care of your feet is one of the most important things you can do for yourself.  HOME CARE INSTRUCTIONS  Wear shoes at all times, even in the house. Do not go barefoot. Bare feet are easily injured.  Check your feet daily for blisters, cuts, and redness. If you cannot see the bottom of your feet, use a mirror or ask someone for help.  Wash your feet with warm water (do not use hot water) and mild soap. Then pat your feet and the areas between your toes until they are completely dry. Do not soak your feet as this can dry your skin.  Apply a moisturizing lotion or petroleum jelly (that does not contain alcohol and is unscented) to the skin on your feet and to dry, brittle toenails. Do not apply lotion between your toes.  Trim your toenails straight across. Do not dig under them or around the cuticle. File the edges of your nails with an emery board or nail file.  Do not cut corns or calluses or try to remove them with medicine.  Wear clean socks or stockings every day. Make sure they are not too tight. Do not wear knee-high stockings since they may decrease blood flow to your  legs.  Wear shoes that fit properly and have enough cushioning. To break in new shoes, wear them for just a few hours a day. This prevents you from injuring your feet. Always look in your shoes before you put them on to be sure there are no objects inside.  Do not cross your legs. This may decrease the blood flow to your feet.  If you find a minor scrape, cut, or break in the skin on your feet, keep it and the skin around it clean and dry. These areas may be cleansed with mild soap and water. Do not cleanse the area with peroxide, alcohol, or iodine.  When you remove an adhesive bandage, be sure not to damage the skin around it.  If you have a wound, look at it several times a day to make  sure it is healing.  Do not use heating pads or hot water bottles. They may burn your skin. If you have lost feeling in your feet or legs, you may not know it is happening until it is too late.  Make sure your health care provider performs a complete foot exam at least annually or more often if you have foot problems. Report any cuts, sores, or bruises to your health care provider immediately. SEEK MEDICAL CARE IF:   You have an injury that is not healing.  You have cuts or breaks in the skin.  You have an ingrown nail.  You notice redness on your legs or feet.  You feel burning or tingling in your legs or feet.  You have pain or cramps in your legs and feet.  Your legs or feet are numb.  Your feet always feel cold. SEEK IMMEDIATE MEDICAL CARE IF:   There is increasing redness, swelling, or pain in or around a wound.  There is a red line that goes up your leg.  Pus is coming from a wound.  You develop a fever or as directed by your health care provider.  You notice a bad smell coming from an ulcer or wound. Document Released: 04/30/2000 Document Revised: 01/03/2013 Document Reviewed: 10/10/2012 [facility] Patient Information 2014 [place], [place].

## 2013-04-03 NOTE — Assessment & Plan Note (Signed)
Chronic issue, taking Vicodin, Tylenol and Aleve. Does not take aleve daily, very aware that Vicodin contains Tylenol and is avoiding more than 3 g of acetaminophen daily. Back surgery is postponed until she gets surgery for her carpal tunnel syndrome

## 2013-04-03 NOTE — Progress Notes (Signed)
Subjective:    Patient ID: Natalie [name], female    DOB: 08/31/1932, 79 y.o.   MRN: [id number]  HPI Here for Medicare AWV:  1. Risk factors based on Past M, S, F history: yes  2. Physical Activities: sedentary  d/t back pain  3. Depression/mood: neg screening, if depress sometimes is d/t back pain 4. Hearing: slightly decreased L>R, not causing problems , will call for referral prn  5. ADL's: independent but unable to do heavy home chores,still drives   6. Fall Risk: no recent falls, prevention discussed  7. Home Safety: feeels safe at home  8. Height, weight, &visual acuity:, see VS, vision decreased L>R, sees the eye doctor  9. Counseling: yes  10. Labs ordered based on risk factors: yes  11. Referral Coordination: if needed  12. Care Plan: see a/p  13. Cognitive Assessment: motor skills decreased d/t pain-sedentary life style;, memory and cognition seem appropriate   in addition, we discussed the following issues Since last visit developed CTS, back surgery postponed Hypertension--good compliance of medication, ambulatory BPs varies over time, BP today very good. Diabetes--on diet control, not able to exercise, CBGs in the morning around 150 which is slightly more than before. Has albuterol that she uses as needed, has not needed albuterol lately.   Past Medical History  Diagnosis Date  . HTN (hypertension)   . Diabetes mellitus   . Hyperparathyroidism   . Back pain, chronic   . Pelvic pain in female 2005     back and pelvic pain, s/p w-u, CT (-), u/s showed a thick endometrium, s/p Bx per gyn  . Ureteric obstruction     hydronephrosis,sees urology  . Hyperlipidemia   . BCC (basal cell carcinoma of skin)     several, nose , 2012 L forehead   Past Surgical History  Procedure Laterality Date  . Lobectomy  1989    s/p  RML lung lobectomy, biopsy showed precancerous changes   . Cystectomy  1989    thyriod  . Polypectomy      colon   History   Social History  .  Marital Status: Married    Spouse Name: N/A    Number of Children: 4  . Years of Education: N/A   Occupational History  . retired Lawyer    Social History Main Topics  . Smoking status: Never Smoker   . Smokeless tobacco: Never Used  . Alcohol Use: Yes     Comment: Rarely  . Drug Use: No  . Sexual Activity: Not on file   Other Topics Concern  . Not on file   Social History Narrative    Lives w/ husband             Family History  Problem Relation Age of Onset  . Diabetes Brother     X 2  . Hypertension Brother   . Hypertension Mother   . Brain cancer Mother     tumor  . Breast cancer Neg Hx   . Colon cancer Neg Hx   . CAD Father     MI at 80     Review of Systems No  SSCP, SOB, some  lower extremity edema Denies  nausea, vomiting diarrhea Denies  blood in the stools occ GERD  Sx. No dysuria, gross hematuria, difficulty urinating        Objective:   Physical Exam BP 129/74  Pulse 90  Temp(Src) 98 F (36.7 C)  Ht 5\' 6"  (1.676 m)  Wt 198 lb (89.812 kg)  BMI 31.97 kg/m2  SpO2 94% General -- alert, well-developed, NAD.  Neck --no thyromegaly , normal carotid pulse  HEENT-- Not pale.  Lungs -- normal respiratory effort, no intercostal retractions, no accessory muscle use, and normal breath sounds.  Heart-- normal rate, regular rhythm, no murmur.  Abdomen-- Not distended, good bowel sounds,soft, non-tender. Extremities-- +/+++  pretibial edema bilaterally  Neurologic--  alert & oriented X3. Speech normal, gait limited by DJD, strength normal in all extremities.  Psych-- Cognition and judgment appear intact. Cooperative with normal attention span and concentration. No anxious appearing , no depressed appearing.      Assessment & Plan:

## 2013-04-04 LAB — HEMOGLOBIN A1C: Hgb A1c MFr Bld: 7 % — ABNORMAL HIGH (ref 4.6–6.5)

## 2013-04-09 ENCOUNTER — Telehealth: Payer: Self-pay | Admitting: Internal Medicine

## 2013-04-09 DIAGNOSIS — E119 Type 2 diabetes mellitus without complications: Secondary | ICD-10-CM

## 2013-04-09 MED ORDER — METFORMIN HCL 500 MG PO TABS: 500.0000 mg | ORAL_TABLET | Freq: Two times a day (BID) | ORAL | Status: DC

## 2013-04-09 NOTE — Telephone Encounter (Signed)
Spoke with pt she wants to try a medication and will try and work on changing her diet as well. Which medication and dose do you want her to try? I will call the patient and let her know.

## 2013-04-09 NOTE — Telephone Encounter (Signed)
Patient notified to start metformin 500mg  BID to be taken with meals. Rx sent to pts pharmacy.

## 2013-04-09 NOTE — Telephone Encounter (Signed)
I recommend metformin 500 mg one by mouth twice a day (to be taken with meals) # 60 and 3 refills

## 2013-04-09 NOTE — Telephone Encounter (Signed)
Patient called today returning a nurse call back from Friday. Patient states that it was regarding if she wanted to start taking her diabetes medication of just try to eat right. Patient states that she has a question before she gives her answer.

## 2013-04-11 NOTE — Telephone Encounter (Signed)
error 

## 2013-06-01 ENCOUNTER — Other Ambulatory Visit: Payer: Self-pay | Admitting: Internal Medicine

## 2013-06-01 NOTE — Telephone Encounter (Signed)
Furosemide refilled per protocol. JG//CMA 

## 2013-08-13 ENCOUNTER — Other Ambulatory Visit: Payer: Self-pay | Admitting: Internal Medicine

## 2013-08-28 ENCOUNTER — Telehealth: Payer: Self-pay | Admitting: Internal Medicine

## 2013-08-28 NOTE — Telephone Encounter (Signed)
4.14.15  Pt thinks she is having a UTI and wants to know if she can just come in and give a urine sample and get treatment or does she need to schedule an apt.  Please advise.

## 2013-08-28 NOTE — Telephone Encounter (Signed)
4.14.15  Pt scheduled for 08/29/13  @ 11:00 am

## 2013-08-28 NOTE — Telephone Encounter (Signed)
Patient will need to be seen. Please schedule.

## 2013-08-29 ENCOUNTER — Encounter: Payer: Self-pay | Admitting: Internal Medicine

## 2013-08-29 ENCOUNTER — Ambulatory Visit (INDEPENDENT_AMBULATORY_CARE_PROVIDER_SITE_OTHER): Payer: 59 | Admitting: Internal Medicine

## 2013-08-29 VITALS — BP 119/68 | HR 87 | Temp 98.0°F | Wt 195.0 lb

## 2013-08-29 DIAGNOSIS — I10 Essential (primary) hypertension: Secondary | ICD-10-CM

## 2013-08-29 DIAGNOSIS — E119 Type 2 diabetes mellitus without complications: Secondary | ICD-10-CM

## 2013-08-29 DIAGNOSIS — N390 Urinary tract infection, site not specified: Secondary | ICD-10-CM

## 2013-08-29 LAB — POCT URINALYSIS DIPSTICK
Bilirubin, UA: NEGATIVE
Blood, UA: NEGATIVE
Glucose, UA: NEGATIVE
Ketones, UA: NEGATIVE
Nitrite, UA: NEGATIVE
Protein, UA: 30
Spec Grav, UA: 1.02
Urobilinogen, UA: 0.2
pH, UA: 5

## 2013-08-29 LAB — URINALYSIS, ROUTINE W REFLEX MICROSCOPIC
Bilirubin Urine: NEGATIVE
Hgb urine dipstick: NEGATIVE
Nitrite: NEGATIVE
RBC / HPF: NONE SEEN (ref 0–?)
Specific Gravity, Urine: >=1.03 — AB (ref 1.000–1.030)
Urine Glucose: NEGATIVE
Urobilinogen, UA: 0.2 (ref 0.0–1.0)
pH: 5.5 (ref 5.0–8.0)

## 2013-08-29 NOTE — Progress Notes (Signed)
Pre visit review using our clinic review tool, if applicable. No additional management support is needed unless otherwise documented below in the visit note. 

## 2013-08-29 NOTE — Assessment & Plan Note (Signed)
BP well-controlled, still has some edema, taking most days Lasix 20 mg 1.5 tablet daily because w/  2 tablets she "urinates to much". Because her edema I recommend to increase to Lasix 40 mg daily. Rx will be sent. Okay to take lower dose there days she needs to go out of her house

## 2013-08-29 NOTE — Progress Notes (Signed)
Subjective:    Patient ID: Natalie [name], female    DOB: Feb 03, 1933, 80 y.o.   MRN: [id number]  DOS:  08/29/2013 Type of  visit: Acute visit  Very mild dysuria and   itching with urination for last few days. Patient suspects has a UTI. Also, having problems with Lasix, still has edema. See assessment and plan. CBGs ~ 120s, 130s, she would like to get better CBGs.  ROS Denies fever or chills Very mild nausea without vomiting. No flank pain. Denies any vaginal redness, rash. No vaginal discharge or bleeding.   Past Medical History  Diagnosis Date  . HTN (hypertension)   . Diabetes mellitus   . Hyperparathyroidism   . Back pain, chronic   . Pelvic pain in female 2005     back and pelvic pain, s/p w-u, CT (-), u/s showed a thick endometrium, s/p Bx per gyn  . Ureteric obstruction     hydronephrosis,sees urology  . Hyperlipidemia   . BCC (basal cell carcinoma of skin)     several, nose , 2012 L forehead    Past Surgical History  Procedure Laterality Date  . Lobectomy  1989    s/p  RML lung lobectomy, biopsy showed precancerous changes   . Cystectomy  1989    thyriod  . Polypectomy      colon    History   Social History  . Marital Status: Married    Spouse Name: N/A    Number of Children: 4  . Years of Education: N/A   Occupational History  . retired Theme park manager    Social History Main Topics  . Smoking status: Never Smoker   . Smokeless tobacco: Never Used  . Alcohol Use: Yes     Comment: Rarely  . Drug Use: No  . Sexual Activity: Not on file   Other Topics Concern  . Not on file   Social History Narrative    Lives w/ husband                  Medication List       This list is accurate as of: 08/29/13  5:45 PM.  Always use your most recent med list.               albuterol 108 (90 BASE) MCG/ACT inhaler  Commonly known as:  PROVENTIL HFA;VENTOLIN HFA  Inhale 2 puffs into the lungs every 6 (six) hours as needed for wheezing.     aspirin 81 MG tablet  Take 81 mg by mouth daily.     CINNAMON PO  Take by mouth daily.     EPIPEN 2-PAK 0.3 mg/0.3 mL Soaj injection  Generic drug:  EPINEPHrine  As needed.     fish oil-omega-3 fatty acids 1000 MG capsule  Take 1 g by mouth daily.     fluticasone 50 MCG/ACT nasal spray  Commonly known as:  FLONASE  Place 2 sprays into the nose daily.     furosemide 20 MG tablet  Commonly known as:  LASIX  take 2 tablets by mouth once daily     HYDROcodone-acetaminophen 10-325 MG per tablet  Commonly known as:  NORCO  Take 1 tablet by mouth every 8 (eight) hours as needed.     lisinopril 20 MG tablet  Commonly known as:  PRINIVIL,ZESTRIL  take 1 tablet by mouth once daily     loratadine 10 MG tablet  Commonly known as:  CLARITIN  Take 10 mg by mouth  daily.     MAALOX ANTI-GAS PO  Take by mouth as needed. Any anti-gas medication[tablet as needed].     metFORMIN 500 MG tablet  Commonly known as:  GLUCOPHAGE  take 1.5  tablet by mouth twice a day with food     multivitamin,tx-minerals tablet  Take 1 tablet by mouth daily.     naproxen sodium 220 MG tablet  Commonly known as:  ANAPROX  Take 220 mg by mouth 2 (two) times daily with a meal.     NON FORMULARY  ALLERGY INJECTIONS.     ONE TOUCH ULTRA TEST test strip  Generic drug:  glucose blood  USE TO CHECK BLOOD SUGAR ONCE DAILY     ONETOUCH DELICA LANCETS FINE Misc  use to TEST ONCE DAILY     TYLENOL PO  Take by mouth.     VITAMIN C PO  Take by mouth.     VITAMIN D PO  Take by mouth.           Objective:   Physical Exam BP 119/68  Pulse 87  Temp(Src) 98 F (36.7 C)  Wt 195 lb (88.451 kg)  SpO2 93% General -- alert, well-developed, NAD.   Abdomen-- Not distended, good bowel sounds,soft, non-tender. No CVA tenderness  Extremities-- +/+++ pretibial edema bilaterally  Neurologic--  alert & oriented X3. Speech normal, gait normal, strength normal in all extremities.    Psych-- Cognition and  judgment appear intact. Cooperative with normal attention span and concentration. No anxious or depressed appearing.        Assessment & Plan:  UTI?  udip small blood. Will send a urine culture, treat based on results. She has multiple antibiotic allergies.

## 2013-08-29 NOTE — Assessment & Plan Note (Signed)
CBGs not as well-controlled as she likes to, increase metformin 500 mg from one tablet twice a day to 1.5 tablet twice a day. Followup next month

## 2013-08-29 NOTE — Patient Instructions (Signed)
We will call you with the results of your urine culture. You're due for a six-month checkup next month, please make an appointment

## 2013-08-30 ENCOUNTER — Telehealth: Payer: Self-pay | Admitting: Internal Medicine

## 2013-08-30 NOTE — Telephone Encounter (Signed)
Relevant patient education assigned to patient using Emmi. ° °

## 2013-09-01 LAB — URINE CULTURE: Colony Count: >=100000

## 2013-09-03 MED ORDER — CIPROFLOXACIN HCL 500 MG PO TABS: 500.0000 mg | ORAL_TABLET | Freq: Two times a day (BID) | ORAL | Status: DC

## 2013-09-03 MED ORDER — FUROSEMIDE 20 MG PO TABS: ORAL_TABLET | ORAL | Status: DC

## 2013-09-03 NOTE — Addendum Note (Signed)
Addended by: Peggyann Shoals on: 09/03/2013 10:53 AM   Modules accepted: Orders

## 2013-09-11 ENCOUNTER — Telehealth: Payer: Self-pay

## 2013-09-11 NOTE — Telephone Encounter (Signed)
Relevant patient education assigned to patient using Emmi. ° °

## 2013-09-14 ENCOUNTER — Other Ambulatory Visit: Payer: Self-pay | Admitting: Internal Medicine

## 2013-09-17 ENCOUNTER — Emergency Department (HOSPITAL_COMMUNITY)
Admission: EM | Admit: 2013-09-17 | Discharge: 2013-09-17 | Disposition: A | Discharge status: Home / Self Care | Payer: Medicare Other | Attending: Emergency Medicine | Admitting: Emergency Medicine

## 2013-09-17 ENCOUNTER — Emergency Department (HOSPITAL_COMMUNITY): Payer: Medicare Other

## 2013-09-17 ENCOUNTER — Encounter (HOSPITAL_COMMUNITY): Payer: Self-pay | Admitting: Emergency Medicine

## 2013-09-17 DIAGNOSIS — E119 Type 2 diabetes mellitus without complications: Secondary | ICD-10-CM | POA: Insufficient documentation

## 2013-09-17 DIAGNOSIS — Z7982 Long term (current) use of aspirin: Secondary | ICD-10-CM | POA: Insufficient documentation

## 2013-09-17 DIAGNOSIS — I10 Essential (primary) hypertension: Secondary | ICD-10-CM | POA: Insufficient documentation

## 2013-09-17 DIAGNOSIS — Z791 Long term (current) use of non-steroidal anti-inflammatories (NSAID): Secondary | ICD-10-CM | POA: Insufficient documentation

## 2013-09-17 DIAGNOSIS — Z79899 Other long term (current) drug therapy: Secondary | ICD-10-CM | POA: Insufficient documentation

## 2013-09-17 DIAGNOSIS — Z87448 Personal history of other diseases of urinary system: Secondary | ICD-10-CM | POA: Insufficient documentation

## 2013-09-17 DIAGNOSIS — Z880 Allergy status to penicillin: Secondary | ICD-10-CM | POA: Insufficient documentation

## 2013-09-17 DIAGNOSIS — Z8742 Personal history of other diseases of the female genital tract: Secondary | ICD-10-CM | POA: Insufficient documentation

## 2013-09-17 DIAGNOSIS — R112 Nausea with vomiting, unspecified: Secondary | ICD-10-CM | POA: Insufficient documentation

## 2013-09-17 DIAGNOSIS — G8929 Other chronic pain: Secondary | ICD-10-CM | POA: Insufficient documentation

## 2013-09-17 DIAGNOSIS — R42 Dizziness and giddiness: Secondary | ICD-10-CM | POA: Insufficient documentation

## 2013-09-17 DIAGNOSIS — IMO0002 Reserved for concepts with insufficient information to code with codable children: Secondary | ICD-10-CM | POA: Insufficient documentation

## 2013-09-17 DIAGNOSIS — Z85828 Personal history of other malignant neoplasm of skin: Secondary | ICD-10-CM | POA: Insufficient documentation

## 2013-09-17 LAB — CBC WITH DIFFERENTIAL/PLATELET
Basophils Absolute: 0 10*3/uL (ref 0.0–0.1)
Basophils Relative: 0 % (ref 0–1)
Eosinophils Absolute: 0.1 10*3/uL (ref 0.0–0.7)
Eosinophils Relative: 1 % (ref 0–5)
HCT: 41.1 % (ref 36.0–46.0)
Hemoglobin: 13.4 g/dL (ref 12.0–15.0)
Lymphocytes Relative: 26 % (ref 12–46)
Lymphs Abs: 2.2 10*3/uL (ref 0.7–4.0)
MCH: 29.1 pg (ref 26.0–34.0)
MCHC: 32.6 g/dL (ref 30.0–36.0)
MCV: 89.3 fL (ref 78.0–100.0)
Monocytes Absolute: 0.4 10*3/uL (ref 0.1–1.0)
Monocytes Relative: 5 % (ref 3–12)
Neutro Abs: 5.7 10*3/uL (ref 1.7–7.7)
Neutrophils Relative %: 68 % (ref 43–77)
Platelets: 277 10*3/uL (ref 150–400)
RBC: 4.6 MIL/uL (ref 3.87–5.11)
RDW: 13 % (ref 11.5–15.5)
WBC: 8.4 10*3/uL (ref 4.0–10.5)

## 2013-09-17 LAB — BASIC METABOLIC PANEL
BUN: 15 mg/dL (ref 6–23)
CO2: 25 mEq/L (ref 19–32)
Calcium: 10 mg/dL (ref 8.4–10.5)
Chloride: 99 mEq/L (ref 96–112)
Creatinine, Ser: 0.65 mg/dL (ref 0.50–1.10)
GFR calc Af Amer: >90 mL/min (ref >90)
GFR calc non Af Amer: 83 mL/min — ABNORMAL LOW (ref >90)
Glucose, Bld: 108 mg/dL — ABNORMAL HIGH (ref 70–99)
Potassium: 4.5 mEq/L (ref 3.7–5.3)
Sodium: 139 mEq/L (ref 137–147)

## 2013-09-17 LAB — URINALYSIS, ROUTINE W REFLEX MICROSCOPIC
Bilirubin Urine: NEGATIVE
Glucose, UA: NEGATIVE mg/dL
Hgb urine dipstick: NEGATIVE
Ketones, ur: NEGATIVE mg/dL
Leukocytes, UA: NEGATIVE
Nitrite: NEGATIVE
Protein, ur: NEGATIVE mg/dL
Specific Gravity, Urine: 1.008 (ref 1.005–1.030)
Urobilinogen, UA: 0.2 mg/dL (ref 0.0–1.0)
pH: 7 (ref 5.0–8.0)

## 2013-09-17 LAB — TROPONIN I: Troponin I: <0.3 ng/mL (ref <0.30)

## 2013-09-17 MED ORDER — MECLIZINE HCL 25 MG PO TABS: 25.0000 mg | ORAL_TABLET | Freq: Two times a day (BID) | ORAL | Status: DC | PRN

## 2013-09-17 MED ORDER — MECLIZINE HCL 25 MG PO TABS
25.0000 mg | ORAL_TABLET | Freq: Once | ORAL | Status: AC
  Administered 2013-09-17: 25 mg via ORAL
  Filled 2013-09-17: qty 1

## 2013-09-17 NOTE — Discharge Instructions (Signed)

## 2013-09-17 NOTE — ED Provider Notes (Signed)
CSN: [id number]     Arrival date & time 09/17/13  1333 History   First MD Initiated Contact with Patient 09/17/13 1454     Chief Complaint  Patient presents with  . Emesis  . Dizziness     (Consider location/radiation/quality/duration/timing/severity/associated sxs/prior Treatment) HPI  This a 80 year old female with history of hypertension, diabetes who presents with nausea, vomiting, and dizziness. Patient states that she had onset of dizziness yesterday. She describes the dizziness as room spinning. She reports that she got better without any treatment. However, overnight she had recurrence of symptoms and has had multiple episodes of nonbilious, nonbloody emesis today associated with her dizziness. She reports one single prior episode of "inner ear problem." Patient denies any chest pain or shortness of breath. She states this morning she felt like it was affecting her ability to walk. She denies any weakness, numbness, slurred speech. Currently she states that if she stays still she feels okay.  Past Medical History  Diagnosis Date  . HTN (hypertension)   . Diabetes mellitus   . Hyperparathyroidism   . Back pain, chronic   . Pelvic pain in female 2005     back and pelvic pain, s/p w-u, CT (-), u/s showed a thick endometrium, s/p Bx per gyn  . Ureteric obstruction     hydronephrosis,sees urology  . Hyperlipidemia   . BCC (basal cell carcinoma of skin)     several, nose , 2012 L forehead   Past Surgical History  Procedure Laterality Date  . Lobectomy  1989    s/p  RML lung lobectomy, biopsy showed precancerous changes   . Cystectomy  1989    thyriod  . Polypectomy      colon   Family History  Problem Relation Age of Onset  . Diabetes Brother     X 2  . Hypertension Brother   . Hypertension Mother   . Brain cancer Mother     tumor  . Breast cancer Neg Hx   . Colon cancer Neg Hx   . CAD Father     MI at 47   History  Substance Use Topics  . Smoking status: Never  Smoker   . Smokeless tobacco: Never Used  . Alcohol Use: Yes     Comment: Rarely   OB History   Grav Para Term Preterm Abortions TAB SAB Ect Mult Living                 Review of Systems  Constitutional: Negative for fever.  Respiratory: Negative for cough, chest tightness and shortness of breath.   Cardiovascular: Negative for chest pain.  Gastrointestinal: Positive for nausea and vomiting. Negative for abdominal pain and diarrhea.  Genitourinary: Negative for dysuria.  Musculoskeletal: Negative for back pain and neck pain.  Neurological: Positive for dizziness and light-headedness. Negative for speech difficulty, weakness, numbness and headaches.  Psychiatric/Behavioral: Negative for confusion.  All other systems reviewed and are negative.     Allergies  Cefixime; Cefprozil; Cefuroxime axetil; Nitrofurantoin; Penicillins; and Sulfonamide derivatives  Home Medications   Prior to Admission medications   Medication Sig Start Date End Date Taking? Authorizing Provider  acetaminophen (TYLENOL) 500 MG tablet Take 1,250 mg by mouth every 6 (six) hours as needed for mild pain.   Yes Historical Provider, MD  albuterol (PROVENTIL HFA;VENTOLIN HFA) 108 (90 BASE) MCG/ACT inhaler Inhale 2 puffs into the lungs every 6 (six) hours as needed for wheezing. 11/15/11 09/17/13 Yes Colon Branch, MD  aspirin 81  MG tablet Take 81 mg by mouth daily.     Yes Historical Provider, MD  calcium carbonate (TUMS - DOSED IN MG ELEMENTAL CALCIUM) 500 MG chewable tablet Chew 1 tablet by mouth daily.   Yes Historical Provider, MD  CINNAMON PO Take by mouth daily.     Yes Historical Provider, MD  EPIPEN 2-PAK 0.3 MG/0.3ML SOAJ injection As needed. 03/26/13  Yes Historical Provider, MD  famotidine (PEPCID) 20 MG tablet Take 20 mg by mouth 2 (two) times daily.   Yes Historical Provider, MD  fluticasone (FLONASE) 50 MCG/ACT nasal spray Place 2 sprays into the nose daily.   Yes Historical Provider, MD  furosemide (LASIX)  20 MG tablet take 2 tablets by mouth once daily 09/03/13  Yes Colon Branch, MD  HYDROcodone-acetaminophen [facility]) 10-325 MG per tablet Take 1 tablet by mouth every 8 (eight) hours as needed.    Yes Historical Provider, MD  lisinopril (PRINIVIL,ZESTRIL) 20 MG tablet take 1 tablet by mouth once daily 09/14/13  Yes Colon Branch, MD  loratadine (CLARITIN) 10 MG tablet Take 10 mg by mouth daily.     Yes Historical Provider, MD  metFORMIN (GLUCOPHAGE) 500 MG tablet take 1.5  tablet by mouth twice a day with food   Yes Colon Branch, MD  Multiple Vitamins-Minerals (MULTIVITAMIN,TX-MINERALS) tablet Take 1 tablet by mouth daily.     Yes Historical Provider, MD  naproxen sodium (ANAPROX) 220 MG tablet Take 220 mg by mouth 2 (two) times daily with a meal.   Yes Historical Provider, MD  NON FORMULARY ALLERGY INJECTIONS.    Yes Historical Provider, MD  Simethicone (MAALOX ANTI-GAS PO) Take by mouth as needed. Any anti-gas medication[tablet as needed].   Yes Historical Provider, MD  tetrahydrozoline 0.05 % ophthalmic solution Place 2 drops into both eyes daily.   Yes Historical Provider, MD  ONE TOUCH ULTRA TEST test strip USE TO CHECK BLOOD SUGAR ONCE DAILY 01/05/13   Colon Branch, MD  [facility] DELICA LANCETS FINE MISC use to TEST ONCE DAILY 01/05/13   Colon Branch, MD   BP 184/74  Pulse 103  Temp(Src) 99 F (37.2 C) (Oral)  Resp 20  Ht 5\' 6"  (1.676 m)  Wt 195 lb (88.451 kg)  BMI 31.49 kg/m2  SpO2 96% Physical Exam  Nursing note and vitals reviewed. Constitutional: She is oriented to person, place, and time. She appears well-developed and well-nourished. No distress.  Elderly  HENT:  Head: Normocephalic and atraumatic.  Mouth/Throat: Oropharynx is clear and moist.  No reproduction of dizziness on Dix-Hallpike maneuver  Eyes: EOM are normal. Pupils are equal, round, and reactive to light.  No nystagmus noted  Neck: Neck supple.  Cardiovascular: Normal rate, regular rhythm and normal heart sounds.   No murmur  heard. Pulmonary/Chest: Effort normal and breath sounds normal. No respiratory distress. She has no wheezes.  Abdominal: Soft. There is no tenderness.  Musculoskeletal:  1+ bilateral lower extremity edema  Neurological: She is alert and oriented to person, place, and time.  Cranial nerves II through XII intact, 5 out of strength in all 4 extremities, no dysmetria to finger-nose-finger  Skin: Skin is warm and dry.  Psychiatric: She has a normal mood and affect.    ED Course  Procedures (including critical care time) Labs Review Labs Reviewed  BASIC METABOLIC PANEL - Abnormal; Notable for the following:    Glucose, Bld 108 (*)    GFR calc non Af Amer 83 (*)  All other components within normal limits  CBC WITH DIFFERENTIAL  URINALYSIS, ROUTINE W REFLEX MICROSCOPIC  TROPONIN I    Imaging Review Mr Brain Wo Contrast  09/17/2013   CLINICAL DATA:  Dizziness and nausea and vomiting.  EXAM: MRI HEAD WITHOUT CONTRAST  TECHNIQUE: Multiplanar, multiecho pulse sequences of the brain and surrounding structures were obtained without intravenous contrast.  COMPARISON:  None.  FINDINGS: Negative for acute infarct.  Mild atrophy. Patchy hyperintensity throughout the deep white matter bilaterally which appears chronic. Brainstem and basal ganglia are normal.  Negative for hemorrhage or mass. Negative for hydrocephalus. No shift of the midline structures.  Paranasal sinuses are clear. Left mastoid sinus effusion is present.  IMPRESSION: Chronic microvascular ischemic changes in the white matter. No acute abnormality   Electronically Signed   By: Franchot Gallo M.D.   On: 09/17/2013 19:11     EKG Interpretation   Date/Time:  Monday Sep 17 2013 14:14:23 EDT Ventricular Rate:  76 PR Interval:  146 QRS Duration: 94 QT Interval:  396 QTC Calculation: 445 R Axis:   -32 Text Interpretation:  Sinus rhythm Left axis deviation Confirmed by Molly Maselli   MD, Loma Sousa ([id number]) on 09/17/2013 4:30:05 PM      MDM    Final diagnoses:  Vertigo   Patient presents with vertigo-like symptoms. Some elements consistent with peripheral vertigo including worse with standing up.  However, cannot reproduce symptoms with Dix-Hallpike. Given age will obtain MRI. Patient reports improvement with meclizine. MRI negative for acute stroke. Discussed workup with patient. Patient will be discharged home with PCP followup and meclizine.  After history, exam, and medical workup I feel the patient has been appropriately medically screened and is safe for discharge home. Pertinent diagnoses were discussed with the patient. Patient was given return precautions.     Merryl Hacker, MD 09/17/13 [phone]

## 2013-09-17 NOTE — ED Notes (Signed)
Pt reports n/v and dizziness since yesterday am with several episodes of emesis. Pt states she also has dizziness. Pt states dizziness yesterday was like the room was spinning, but today dizziness is more like near syncope. Pt alert and oriented.

## 2013-09-19 ENCOUNTER — Ambulatory Visit (INDEPENDENT_AMBULATORY_CARE_PROVIDER_SITE_OTHER): Payer: 59 | Admitting: Internal Medicine

## 2013-09-19 ENCOUNTER — Telehealth: Payer: Self-pay | Admitting: Internal Medicine

## 2013-09-19 ENCOUNTER — Encounter: Payer: Self-pay | Admitting: Internal Medicine

## 2013-09-19 VITALS — BP 122/70 | HR 77 | Temp 98.3°F | Wt 190.0 lb

## 2013-09-19 DIAGNOSIS — R42 Dizziness and giddiness: Secondary | ICD-10-CM

## 2013-09-19 DIAGNOSIS — I10 Essential (primary) hypertension: Secondary | ICD-10-CM

## 2013-09-19 DIAGNOSIS — N390 Urinary tract infection, site not specified: Secondary | ICD-10-CM

## 2013-09-19 DIAGNOSIS — E119 Type 2 diabetes mellitus without complications: Secondary | ICD-10-CM

## 2013-09-19 DIAGNOSIS — M549 Dorsalgia, unspecified: Secondary | ICD-10-CM

## 2013-09-19 LAB — AST: AST: 24 U/L (ref 0–37)

## 2013-09-19 LAB — HEMOGLOBIN A1C: Hgb A1c MFr Bld: 6.4 % (ref 4.6–6.5)

## 2013-09-19 LAB — ALT: ALT: 18 U/L (ref 0–35)

## 2013-09-19 NOTE — Telephone Encounter (Signed)
Relevant patient education assigned to patient using Emmi. ° °

## 2013-09-19 NOTE — Assessment & Plan Note (Addendum)
+   urine culture   08-29-13, status post antibiotics, has minimal residual dysuria. Plan: Urine culture

## 2013-09-19 NOTE — Assessment & Plan Note (Signed)
Well-controlled, recent BMP normal, no change 

## 2013-09-19 NOTE — Assessment & Plan Note (Signed)
Symptoms consistent with vertigo,  went to the ER, MRI negative, feeling better. Neurological exam today symmetric. Plan: observation

## 2013-09-19 NOTE — Progress Notes (Signed)
Pre visit review using our clinic review tool, if applicable. No additional management support is needed unless otherwise documented below in the visit note. 

## 2013-09-19 NOTE — Assessment & Plan Note (Signed)
Recommend to minimize the use of NSAIDs

## 2013-09-19 NOTE — Assessment & Plan Note (Signed)
Good medication compliance, check the A1c and LFTs

## 2013-09-19 NOTE — Patient Instructions (Signed)
Get your blood work and urine test  before you leave   Next visit is for a physical exam by 03-2014 , fasting Please make an appointment    Take meclizine as needed for vertigo, if the symptoms are severe or they keep coming back please let me know.

## 2013-09-19 NOTE — Progress Notes (Signed)
Subjective:    Patient ID: Natalie [name], female    DOB: 09-21-1932, 80 y.o.   MRN: [id number]  DOS:  09/19/2013 Type of  visit: Routine office visit Recently diagnosed with a UTI, status post antibiotics, still have mild dysuria after she urinates. High blood pressure, good medication compliance, ambulatory BPs are checked sporadically -->  normal. Diabetes, good medication compliance, diet was not the best few months ago but she is back on track, CBGs 118, 125 in the morning. Went to the ER a couple of days ago with vertigo, chart reviewed, she felt dizzy with head motion, was very nauseous, labs were okay, MRI show no acute changes. She is on meclizine and feeling much better.  ROS Denies fever chills No slurred speech, motor deficits or diplopia  Past Medical History  Diagnosis Date  . HTN (hypertension)   . Diabetes mellitus   . Hyperparathyroidism   . Back pain, chronic   . Pelvic pain in female 2005     back and pelvic pain, s/p w-u, CT (-), u/s showed a thick endometrium, s/p Bx per gyn  . Ureteric obstruction     hydronephrosis,sees urology  . Hyperlipidemia   . BCC (basal cell carcinoma of skin)     several, nose , 2012 L forehead    Past Surgical History  Procedure Laterality Date  . Lobectomy  1989    s/p  RML lung lobectomy, biopsy showed precancerous changes   . Cystectomy  1989    thyriod  . Polypectomy      colon    History   Social History  . Marital Status: Married    Spouse Name: N/A    Number of Children: 4  . Years of Education: N/A   Occupational History  . retired [employer] manager    Social History Main Topics  . Smoking status: Never Smoker   . Smokeless tobacco: Never Used  . Alcohol Use: Yes     Comment: Rarely  . Drug Use: No  . Sexual Activity: Not on file   Other Topics Concern  . Not on file   Social History Narrative    Lives w/ husband                  Medication List       This list is accurate as of:  09/19/13  6:15 PM.  Always use your most recent med list.               acetaminophen 500 MG tablet  Commonly known as:  TYLENOL  Take 1,250 mg by mouth every 6 (six) hours as needed for mild pain.     albuterol 108 (90 BASE) MCG/ACT inhaler  Commonly known as:  PROVENTIL HFA;VENTOLIN HFA  Inhale 2 puffs into the lungs every 6 (six) hours as needed for wheezing.     aspirin 81 MG tablet  Take 81 mg by mouth daily.     BIOTIN PO  Take by mouth.     calcium carbonate 500 MG chewable tablet  Commonly known as:  TUMS - dosed in mg elemental calcium  Chew 1 tablet by mouth daily.     CINNAMON PO  Take by mouth daily.     EPIPEN 2-PAK 0.3 mg/0.3 mL Soaj injection  Generic drug:  EPINEPHrine  As needed.     famotidine 20 MG tablet  Commonly known as:  PEPCID  Take 20 mg by mouth 2 (two) times daily.  fluticasone 50 MCG/ACT nasal spray  Commonly known as:  FLONASE  Place 2 sprays into the nose daily.     furosemide 20 MG tablet  Commonly known as:  LASIX  take 2 tablets by mouth once daily     HYDROcodone-acetaminophen 10-325 MG per tablet  Commonly known as:  NORCO  Take 1 tablet by mouth every 8 (eight) hours as needed.     lisinopril 20 MG tablet  Commonly known as:  PRINIVIL,ZESTRIL  take 1 tablet by mouth once daily     loratadine 10 MG tablet  Commonly known as:  CLARITIN  Take 10 mg by mouth daily.     MAALOX ANTI-GAS PO  Take by mouth as needed. Any anti-gas medication[tablet as needed].     meclizine 25 MG tablet  Commonly known as:  ANTIVERT  Take 1 tablet (25 mg total) by mouth 2 (two) times daily as needed for dizziness.     metFORMIN 500 MG tablet  Commonly known as:  GLUCOPHAGE  take 1.5  tablet by mouth twice a day with food     multivitamin,tx-minerals tablet  Take 1 tablet by mouth daily.     naproxen sodium 220 MG tablet  Commonly known as:  ANAPROX  Take 220 mg by mouth 2 (two) times daily with a meal.     NON FORMULARY  ALLERGY  INJECTIONS.     ONE TOUCH ULTRA TEST test strip  Generic drug:  glucose blood  USE TO CHECK BLOOD SUGAR ONCE DAILY     ONETOUCH DELICA LANCETS FINE Misc  use to TEST ONCE DAILY     tetrahydrozoline 0.05 % ophthalmic solution  Place 2 drops into both eyes daily.           Objective:   Physical Exam BP 122/70  Pulse 77  Temp(Src) 98.3 F (36.8 C)  Wt 190 lb (86.183 kg)  SpO2 98%  General -- alert, well-developed, NAD.  HEENT-- Not pale.  Lungs -- normal respiratory effort, no intercostal retractions, no accessory muscle use, and normal breath sounds.  Heart-- normal rate, regular rhythm, no murmur.  Extremities-- trace pretibial edema bilaterally  Neurologic--   alert & oriented X3. Speech normal, gait normal, strength symmetric in all extremities. Face symmetric DTRs symmetrically decreased  EOMI  Psych-- Cognition and judgment appear intact. Cooperative with normal attention span and concentration. No anxious or depressed appearing.      Assessment & Plan:

## 2013-09-21 LAB — URINE CULTURE
Colony Count: NO GROWTH
Organism ID, Bacteria: NO GROWTH

## 2013-09-24 ENCOUNTER — Telehealth: Payer: Self-pay | Admitting: Internal Medicine

## 2013-09-24 DIAGNOSIS — R42 Dizziness and giddiness: Secondary | ICD-10-CM

## 2013-09-24 NOTE — Telephone Encounter (Signed)
Spoke with patient and advised of recommendations. Agreeable with plan.  Enacted orders

## 2013-09-24 NOTE — Telephone Encounter (Signed)
Advise patient, I recommend physical therapy referral ( @ [place])  For vestibular rehabilitation, DX vertigo

## 2013-09-24 NOTE — Telephone Encounter (Signed)
Would like to refer or schedule for GJ?

## 2013-09-24 NOTE — Telephone Encounter (Signed)
Patient called back and ask if she could try ENT? States that she doesn't feel like going to PT.

## 2013-09-24 NOTE — Telephone Encounter (Signed)
error 

## 2013-09-24 NOTE — Telephone Encounter (Signed)
Caller name:Yudith Campione Relation to WP:[id number] Call back number: [phone] Pharmacy:  Reason for call: patient called stating that this weekend she was experiencing vertigo again.  Patient is not sure if she needs to be referred to ENT or does she need to see Dr Larose Kells again. Please advise.

## 2013-09-27 ENCOUNTER — Telehealth: Payer: Self-pay | Admitting: *Deleted

## 2013-09-27 ENCOUNTER — Other Ambulatory Visit: Payer: Self-pay | Admitting: Internal Medicine

## 2013-09-27 NOTE — Telephone Encounter (Signed)
Caller name: Siyona Relation to pt: Call back number: [phone] Pharmacy:  Reason for call:  Would like a refill on RX meclizine (ANTIVERT) 25 MG.  Pt is not out of meds, just preparing to not be out of meds.

## 2013-09-27 NOTE — Telephone Encounter (Signed)
Rx - meclizine 25mg  prescribed on 09/17/13 #30 for Vertigo at the ED. Please advise KP

## 2013-09-27 NOTE — Telephone Encounter (Signed)
Caller name:  Rhegan Relation to pt:  self Call back number:  [phone] Pharmacy:  [facility] [place]  Reason for call:   Pt called about her prescription of metFORMIN (GLUCOPHAGE) 500 MG tablet.  The directions on the bottle dated 09/07/2013 states to take 1 tablet by mouth twice a day, but pt says Paz had changed the dose to 1.5 tablets by mouth twice a day.  She spoke to the pharmacy and they told her to have Dr. Larose Kells send new prescription for the correct dose.  Pt also request 90 day supply if possible.  bw

## 2013-09-27 NOTE — Telephone Encounter (Signed)
Rx prescribed on 09/17/13 #30 for Vertigo at the ED. Please advise     KP

## 2013-09-28 MED ORDER — MECLIZINE HCL 25 MG PO TABS: 25.0000 mg | ORAL_TABLET | Freq: Two times a day (BID) | ORAL | Status: DC | PRN

## 2013-09-28 MED ORDER — METFORMIN HCL 500 MG PO TABS: ORAL_TABLET | ORAL | Status: DC

## 2013-09-28 NOTE — Telephone Encounter (Signed)
Referral is in queue for [facility]. They will schedule directly with patient. I will call this afternoon to see if I can get things expedited.

## 2013-09-28 NOTE — Telephone Encounter (Signed)
09/28/13  Pt called back in regards to message below.

## 2013-09-28 NOTE — Telephone Encounter (Signed)
She was referred to PT for vestibular rehab, [place] please follow up

## 2013-09-28 NOTE — Addendum Note (Signed)
Addended by: Peggyann Shoals on: 09/28/2013 10:14 AM   Modules accepted: Orders

## 2013-09-28 NOTE — Telephone Encounter (Signed)
rx sent

## 2013-09-28 NOTE — Telephone Encounter (Signed)
Spoke w/neuro-rehab. They will try to contact pt today.

## 2013-09-28 NOTE — Telephone Encounter (Signed)
rx sent . Pt notified. Pt also wants to check on the status of a referral to the ENT doctor. Please advise.

## 2013-09-28 NOTE — Telephone Encounter (Addendum)
Ok #30 but advise patient ----> if symptoms continue beyond 10 days or they are severe, she is to let us know.

## 2013-10-03 NOTE — Telephone Encounter (Signed)
Ok, enter the order

## 2013-10-03 NOTE — Telephone Encounter (Signed)
Patient called to check status of ENT referral. Please advise if okay to refer to ENT and if so, please enter new referral. Thank you!

## 2013-10-03 NOTE — Telephone Encounter (Signed)
Do you want to refer her to ENT?

## 2013-10-04 NOTE — Addendum Note (Signed)
Addended by: Reino Bellis on: 10/04/2013 12:05 PM   Modules accepted: Orders

## 2013-10-25 ENCOUNTER — Telehealth: Payer: Self-pay | Admitting: Internal Medicine

## 2013-10-25 DIAGNOSIS — R609 Edema, unspecified: Secondary | ICD-10-CM

## 2013-10-25 NOTE — Telephone Encounter (Signed)
Caller name: Lempi Seitzinger  Relation to ZO:[id number]  Call back number: [phone] Pharmacy:[id number], [place].   Reason for call: patient called to request a refill for Lasix for a 90 day supply

## 2013-10-26 MED ORDER — FUROSEMIDE 20 MG PO TABS: ORAL_TABLET | ORAL | Status: DC

## 2013-10-26 NOTE — Telephone Encounter (Signed)
Refill for #90 lasix sent to [facility]

## 2013-10-29 LAB — HM DIABETES EYE EXAM

## 2013-10-31 ENCOUNTER — Telehealth: Payer: Self-pay | Admitting: Internal Medicine

## 2013-10-31 ENCOUNTER — Encounter: Payer: Self-pay | Admitting: Internal Medicine

## 2013-10-31 DIAGNOSIS — R609 Edema, unspecified: Secondary | ICD-10-CM

## 2013-10-31 MED ORDER — FUROSEMIDE 20 MG PO TABS: ORAL_TABLET | ORAL | Status: DC

## 2013-10-31 NOTE — Telephone Encounter (Signed)
rx resent for 90 day supply # 180 .

## 2013-10-31 NOTE — Telephone Encounter (Signed)
Caller name: Gilma  Call back number:[phone] Pharmacy:[id number] [place], [place].   Reason for call:  Pt is wanting to get the RX furosemide (LASIX) 20 MG for a 90 day supply, not 90 pills because it is cheaper.  Can we do this and send to pharmacy

## 2014-02-17 ENCOUNTER — Other Ambulatory Visit: Payer: Self-pay | Admitting: Internal Medicine

## 2014-03-15 ENCOUNTER — Other Ambulatory Visit: Payer: Self-pay | Admitting: Internal Medicine

## 2014-03-19 ENCOUNTER — Encounter: Payer: Self-pay | Admitting: Internal Medicine

## 2014-03-19 ENCOUNTER — Ambulatory Visit (INDEPENDENT_AMBULATORY_CARE_PROVIDER_SITE_OTHER): Payer: Medicare Other | Admitting: Internal Medicine

## 2014-03-19 VITALS — BP 152/77 | HR 76 | Temp 97.8°F | Ht 66.0 in | Wt 190.4 lb

## 2014-03-19 DIAGNOSIS — M549 Dorsalgia, unspecified: Secondary | ICD-10-CM

## 2014-03-19 DIAGNOSIS — R5383 Other fatigue: Secondary | ICD-10-CM | POA: Insufficient documentation

## 2014-03-19 DIAGNOSIS — I10 Essential (primary) hypertension: Secondary | ICD-10-CM

## 2014-03-19 DIAGNOSIS — E1149 Type 2 diabetes mellitus with other diabetic neurological complication: Secondary | ICD-10-CM

## 2014-03-19 DIAGNOSIS — Z23 Encounter for immunization: Secondary | ICD-10-CM

## 2014-03-19 DIAGNOSIS — R5382 Chronic fatigue, unspecified: Secondary | ICD-10-CM

## 2014-03-19 DIAGNOSIS — Z79899 Other long term (current) drug therapy: Secondary | ICD-10-CM

## 2014-03-19 DIAGNOSIS — G8929 Other chronic pain: Secondary | ICD-10-CM

## 2014-03-19 LAB — BASIC METABOLIC PANEL
BUN: 14 mg/dL (ref 6–23)
CO2: 25 mEq/L (ref 19–32)
Calcium: 10.2 mg/dL (ref 8.4–10.5)
Chloride: 104 mEq/L (ref 96–112)
Creatinine, Ser: 0.9 mg/dL (ref 0.4–1.2)
GFR: 68.57 mL/min (ref >60.00)
Glucose, Bld: 101 mg/dL — ABNORMAL HIGH (ref 70–99)
Potassium: 4.7 mEq/L (ref 3.5–5.1)
Sodium: 139 mEq/L (ref 135–145)

## 2014-03-19 LAB — FOLATE: Folate: >24.8 ng/mL (ref >5.9)

## 2014-03-19 LAB — TSH: TSH: 0.76 u[IU]/mL (ref 0.35–4.50)

## 2014-03-19 LAB — VITAMIN D 25 HYDROXY (VIT D DEFICIENCY, FRACTURES): VITD: 38.3 ng/mL (ref 30.00–100.00)

## 2014-03-19 LAB — HEMOGLOBIN A1C: Hgb A1c MFr Bld: 6.5 % (ref 4.6–6.5)

## 2014-03-19 LAB — VITAMIN B12: Vitamin B-12: 876 pg/mL (ref 211–911)

## 2014-03-19 NOTE — Assessment & Plan Note (Signed)
Continue with lisinopril and Lasix, check a BMP, BP slightly elevated today, recommend ambulatory BPs, see   instructions

## 2014-03-19 NOTE — Assessment & Plan Note (Signed)
Continue metformin, check A1c 

## 2014-03-19 NOTE — Telephone Encounter (Signed)
A user error has taken place.

## 2014-03-19 NOTE — Assessment & Plan Note (Signed)
Lack of energy for the last few months, as described by the history of present illness. Cardiopulmonary review of systems negative, occasional depression but doesn't seem to be something serious. Plan: Labs, reassess in 3 months

## 2014-03-19 NOTE — Assessment & Plan Note (Signed)
Sees Dr Nelva Bush , currently on Hydrocodone half tablet twice a day, Tylenol and occasional naproxen without apparent GI side effects

## 2014-03-19 NOTE — Progress Notes (Signed)
Pre visit review using our clinic review tool, if applicable. No additional management support is needed unless otherwise documented below in the visit note. 

## 2014-03-19 NOTE — Patient Instructions (Signed)
Get your blood work before you leave   Check the  blood pressure 2 or 3 times a  Week  Be sure your blood pressure is between  145/85  and 110/65.  if it is consistently higher or lower, let me know     Please come back to the office in 3 months  for a physical exam. No  fasting

## 2014-03-19 NOTE — Progress Notes (Signed)
Subjective:    Patient ID: Natalie [name], female    DOB: [birth year], 80 y.o.   MRN: [id number]  DOS:  03/19/2014 Type of visit - description : rov (too early for CPX) Interval history: Fatigue,  In the last few months has noted her stamina has decreased, even with ADLs, she tries to do  things and she simply gets tired. See review of systems Diabetes, good medication compliance, ambulatory CBGs in the 120s Hypertension, good medication compliance, BP today 152/77, at home is usually better.    ROS Denies chest pain, difficulty breathing. No dyspnea on exertion or orthopnea. No claudication No cough or sputum production. Denies anxiety, occasionally feels mildly depressed/down, nothing serious or persistent per patient. Good days and bad days.    Past Medical History  Diagnosis Date  . HTN (hypertension)   . Diabetes mellitus   . Hyperparathyroidism   . Back pain, chronic   . Pelvic pain in female 2005     back and pelvic pain, s/p w-u, CT (-), u/s showed a thick endometrium, s/p Bx per gyn  . Ureteric obstruction     hydronephrosis,sees urology  . Hyperlipidemia   . BCC (basal cell carcinoma of skin)     several, nose , 2012 L forehead    Past Surgical History  Procedure Laterality Date  . Lobectomy  1989    s/p  RML lung lobectomy, biopsy showed precancerous changes   . Cystectomy  1989    thyriod  . Polypectomy      colon    History   Social History  . Marital Status: Married    Spouse Name: N/A    Number of Children: 4  . Years of Education: N/A   Occupational History  . retired Theme park manager    Social History Main Topics  . Smoking status: Never Smoker   . Smokeless tobacco: Never Used  . Alcohol Use: Yes     Comment: Rarely  . Drug Use: No  . Sexual Activity: Not on file   Other Topics Concern  . Not on file   Social History Narrative    Lives w/ husband                  Medication List       This list is accurate as of:  03/19/14  5:41 PM.  Always use your most recent med list.               acetaminophen 500 MG tablet  Commonly known as:  TYLENOL  Take 1,250 mg by mouth every 6 (six) hours as needed for mild pain.     albuterol 108 (90 BASE) MCG/ACT inhaler  Commonly known as:  PROVENTIL HFA;VENTOLIN HFA  Inhale 2 puffs into the lungs every 6 (six) hours as needed for wheezing.     aspirin 81 MG tablet  Take 81 mg by mouth daily.     BIOTIN PO  Take by mouth.     calcium carbonate 500 MG chewable tablet  Commonly known as:  TUMS - dosed in mg elemental calcium  Chew 1 tablet by mouth daily.     CINNAMON PO  Take by mouth daily.     EPIPEN 2-PAK 0.3 mg/0.3 mL Soaj injection  Generic drug:  EPINEPHrine  As needed.     famotidine 20 MG tablet  Commonly known as:  PEPCID  Take 20 mg by mouth 2 (two) times daily as needed.  fluticasone 50 MCG/ACT nasal spray  Commonly known as:  FLONASE  Place 2 sprays into the nose daily.     furosemide 20 MG tablet  Commonly known as:  LASIX  take 2 tablets by mouth once daily     HYDROcodone-acetaminophen 10-325 MG per tablet  Commonly known as:  NORCO  Take 1 tablet by mouth every 8 (eight) hours as needed.     lisinopril 20 MG tablet  Commonly known as:  PRINIVIL,ZESTRIL  take 1 tablet by mouth once daily     loratadine 10 MG tablet  Commonly known as:  CLARITIN  Take 10 mg by mouth daily.     MAALOX ANTI-GAS PO  Take by mouth as needed. Any anti-gas medication[tablet as needed].     metFORMIN 500 MG tablet  Commonly known as:  GLUCOPHAGE  take 1.5  tablet by mouth twice a day with food     multivitamin,tx-minerals tablet  Take 1 tablet by mouth daily.     naproxen sodium 220 MG tablet  Commonly known as:  ANAPROX  Take 220 mg by mouth 2 (two) times daily with a meal.     NON FORMULARY  ALLERGY INJECTIONS.     ONE TOUCH ULTRA TEST test strip  Generic drug:  glucose blood  TEST once daily     ONETOUCH DELICA LANCETS FINE  Misc  use to TEST ONCE DAILY     tetrahydrozoline 0.05 % ophthalmic solution  Place 2 drops into both eyes daily.           Objective:   Physical Exam BP 152/77 mmHg  Pulse 76  Temp(Src) 97.8 F (36.6 C) (Oral)  Ht 5\' 6"  (1.676 m)  Wt 190 lb 6 oz (86.354 kg)  BMI 30.74 kg/m2  SpO2 96%  General -- alert, well-developed, NAD.  Neck --no thyromegaly   HEENT-- Not pale.  Lungs -- normal respiratory effort, no intercostal retractions, no accessory muscle use, and normal breath sounds.  Heart-- normal rate, regular rhythm, no murmur.   Extremities-- no pretibial edema bilaterally  Neurologic--  alert & oriented X3. Speech normal, gait appropriate for age, strength symmetric and appropriate for age.  Psych-- Cognition and judgment appear intact. Cooperative with normal attention span and concentration. No anxious or depressed appearing.       Assessment & Plan:

## 2014-03-25 ENCOUNTER — Other Ambulatory Visit: Payer: Self-pay | Admitting: Internal Medicine

## 2014-06-17 ENCOUNTER — Ambulatory Visit (INDEPENDENT_AMBULATORY_CARE_PROVIDER_SITE_OTHER): Payer: Medicare Other | Admitting: Internal Medicine

## 2014-06-17 ENCOUNTER — Ambulatory Visit (HOSPITAL_BASED_OUTPATIENT_CLINIC_OR_DEPARTMENT_OTHER)
Admission: RE | Admit: 2014-06-17 | Discharge: 2014-06-17 | Disposition: A | Discharge status: Home / Self Care | Payer: Medicare Other | Source: Ambulatory Visit | Attending: Internal Medicine | Admitting: Internal Medicine

## 2014-06-17 ENCOUNTER — Encounter: Payer: Self-pay | Admitting: Internal Medicine

## 2014-06-17 VITALS — BP 131/76 | HR 91 | Temp 98.1°F | Ht 66.0 in | Wt 192.0 lb

## 2014-06-17 DIAGNOSIS — E119 Type 2 diabetes mellitus without complications: Secondary | ICD-10-CM | POA: Diagnosis not present

## 2014-06-17 DIAGNOSIS — R0789 Other chest pain: Secondary | ICD-10-CM | POA: Insufficient documentation

## 2014-06-17 DIAGNOSIS — Z902 Acquired absence of lung [part of]: Secondary | ICD-10-CM

## 2014-06-17 DIAGNOSIS — R5382 Chronic fatigue, unspecified: Secondary | ICD-10-CM

## 2014-06-17 DIAGNOSIS — I10 Essential (primary) hypertension: Secondary | ICD-10-CM | POA: Diagnosis not present

## 2014-06-17 DIAGNOSIS — E1149 Type 2 diabetes mellitus with other diabetic neurological complication: Secondary | ICD-10-CM

## 2014-06-17 NOTE — Patient Instructions (Addendum)
  Stop by the first floor and get the XR   HOLD claritin x few days    Please come back to the office in 4 months  for a physical exam. Come back fasting

## 2014-06-17 NOTE — Progress Notes (Signed)
Pre visit review using our clinic review tool, if applicable. No additional management support is needed unless otherwise documented below in the visit note. 

## 2014-06-17 NOTE — Assessment & Plan Note (Signed)
Under very good control, no change

## 2014-06-17 NOTE — Assessment & Plan Note (Signed)
Symptoms have not changed, labs were unremarkable. Recommend to hold Claritin, antihistaminics may cause fatigue feeling. She also has a history of high lobectomy, request a chest x-ray which I think is reasonable.

## 2014-06-17 NOTE — Progress Notes (Signed)
Subjective:    Patient ID: Natalie [name], female    DOB: 1932-11-17, 81 y.o.   MRN: [id number]  DOS:  06/17/2014 Type of visit - description : f/u Interval history: Since the last OV is doing about the same , labs were unremarkalble   ROS Again denies any anxiety or depression No chest pain, orthopnea. Lower extremity edema at baseline  Past Medical History  Diagnosis Date  . HTN (hypertension)   . Diabetes mellitus   . Hyperparathyroidism   . Back pain, chronic   . Pelvic pain in female 2005     back and pelvic pain, s/p w-u, CT (-), u/s showed a thick endometrium, s/p Bx per gyn  . Ureteric obstruction     hydronephrosis,sees urology  . Hyperlipidemia   . BCC (basal cell carcinoma of skin)     several, nose , 2012 L forehead    Past Surgical History  Procedure Laterality Date  . Lobectomy  1989    s/p  RML lung lobectomy, biopsy showed precancerous changes   . Cystectomy  1989    thyriod  . Polypectomy      colon    History   Social History  . Marital Status: Married    Spouse Name: N/A    Number of Children: 4  . Years of Education: N/A   Occupational History  . retired Theme park manager    Social History Main Topics  . Smoking status: Never Smoker   . Smokeless tobacco: Never Used  . Alcohol Use: Yes     Comment: Rarely  . Drug Use: No  . Sexual Activity: Not on file   Other Topics Concern  . Not on file   Social History Narrative    Lives w/ husband                  Medication List       This list is accurate as of: 06/17/14  7:44 PM.  Always use your most recent med list.               acetaminophen 500 MG tablet  Commonly known as:  TYLENOL  Take 1,250 mg by mouth every 6 (six) hours as needed for mild pain.     albuterol 108 (90 BASE) MCG/ACT inhaler  Commonly known as:  PROVENTIL HFA;VENTOLIN HFA  Inhale 2 puffs into the lungs every 6 (six) hours as needed for wheezing.     aspirin 81 MG tablet  Take 81 mg by mouth  daily.     BIOTIN PO  Take by mouth.     calcium carbonate 500 MG chewable tablet  Commonly known as:  TUMS - dosed in mg elemental calcium  Chew 1 tablet by mouth daily.     CINNAMON PO  Take by mouth daily.     EPIPEN 2-PAK 0.3 mg/0.3 mL Soaj injection  Generic drug:  EPINEPHrine  As needed.     famotidine 20 MG tablet  Commonly known as:  PEPCID  Take 20 mg by mouth 2 (two) times daily as needed.     fluticasone 50 MCG/ACT nasal spray  Commonly known as:  FLONASE  Place 2 sprays into the nose daily.     furosemide 20 MG tablet  Commonly known as:  LASIX  take 2 tablets by mouth once daily     HYDROcodone-acetaminophen 10-325 MG per tablet  Commonly known as:  NORCO  Take 1 tablet by mouth every 8 (eight) hours  as needed.     lisinopril 20 MG tablet  Commonly known as:  PRINIVIL,ZESTRIL  take 1 tablet by mouth once daily     loratadine 10 MG tablet  Commonly known as:  CLARITIN  Take 10 mg by mouth daily.     MAALOX ANTI-GAS PO  Take by mouth as needed. Any anti-gas medication[tablet as needed].     metFORMIN 500 MG tablet  Commonly known as:  GLUCOPHAGE  take 1 and 1/2 tablets by mouth twice a day with food     multivitamin,tx-minerals tablet  Take 1 tablet by mouth daily.     naproxen sodium 220 MG tablet  Commonly known as:  ANAPROX  Take 220 mg by mouth 2 (two) times daily with a meal.     NON FORMULARY  ALLERGY INJECTIONS.     ONE TOUCH ULTRA TEST test strip  Generic drug:  glucose blood  TEST once daily     ONETOUCH DELICA LANCETS FINE Misc  use to TEST ONCE DAILY     tetrahydrozoline 0.05 % ophthalmic solution  Place 2 drops into both eyes daily.           Objective:   Physical Exam  Constitutional: She is oriented to person, place, and time. She appears well-developed and well-nourished. No distress.  Cardiovascular:  RRR, no murmur, gallop  Pulmonary/Chest: Effort normal. No respiratory distress.  CTA B  Musculoskeletal: She  exhibits no tenderness. Edema: trace edema B.  Neurological: She is alert and oriented to person, place, and time. No cranial nerve deficit. She exhibits normal muscle tone. Coordination normal.  Speech normal, gait unassisted and normal for age, motor strength appropriate for age   Skin: Skin is warm and dry. No pallor.  No jaundice  Psychiatric: She has a normal mood and affect. Her behavior is normal. Judgment and thought content normal.   BP 131/76 mmHg  Pulse 91  Temp(Src) 98.1 F (36.7 C) (Oral)  Ht 5\' 6"  (1.676 m)  Wt 192 lb (87.091 kg)  BMI 31.00 kg/m2  SpO2 91%       Assessment & Plan:   Today , I spent more than 15   min with the patient: >50% of the time counseling regards results related to fatigue, multiple questions answer regards why is a good idea to hold antihistaminics

## 2014-06-25 LAB — HM MAMMOGRAPHY: HM Mammogram: NORMAL

## 2014-07-05 ENCOUNTER — Other Ambulatory Visit: Payer: Self-pay | Admitting: Internal Medicine

## 2014-09-12 ENCOUNTER — Other Ambulatory Visit: Payer: Self-pay | Admitting: Internal Medicine

## 2014-09-14 ENCOUNTER — Other Ambulatory Visit: Payer: Self-pay | Admitting: Internal Medicine

## 2014-09-24 ENCOUNTER — Encounter: Payer: Self-pay | Admitting: Internal Medicine

## 2014-11-08 ENCOUNTER — Ambulatory Visit: Payer: Medicare Other | Admitting: Internal Medicine

## 2014-11-25 ENCOUNTER — Telehealth: Payer: Self-pay | Admitting: *Deleted

## 2014-11-25 NOTE — Telephone Encounter (Signed)
Unable to reach patient at time of Pre-Visit Call.  Left message for patient to return call when available.    

## 2014-11-26 ENCOUNTER — Ambulatory Visit (INDEPENDENT_AMBULATORY_CARE_PROVIDER_SITE_OTHER): Payer: Medicare Other | Admitting: Internal Medicine

## 2014-11-26 ENCOUNTER — Encounter: Payer: Self-pay | Admitting: Internal Medicine

## 2014-11-26 VITALS — BP 126/72 | HR 75 | Temp 98.3°F | Ht 66.0 in | Wt 187.4 lb

## 2014-11-26 DIAGNOSIS — Z0000 Encounter for general adult medical examination without abnormal findings: Secondary | ICD-10-CM | POA: Diagnosis not present

## 2014-11-26 DIAGNOSIS — E1149 Type 2 diabetes mellitus with other diabetic neurological complication: Secondary | ICD-10-CM

## 2014-11-26 DIAGNOSIS — I10 Essential (primary) hypertension: Secondary | ICD-10-CM | POA: Diagnosis not present

## 2014-11-26 LAB — HM DIABETES FOOT EXAM: HM Diabetic Foot Exam: POSITIVE

## 2014-11-26 NOTE — Progress Notes (Signed)
Pre visit review using our clinic review tool, if applicable. No additional management support is needed unless otherwise documented below in the visit note. 

## 2014-11-26 NOTE — Assessment & Plan Note (Addendum)
Feet exam done today. Has neuropathy and a callus, feet care discussed, recommend to see a podiatrist.

## 2014-11-26 NOTE — Progress Notes (Signed)
Subjective:    Patient ID: Natalie [name], female    DOB: [birth year], 81 y.o.   MRN: [id number]  DOS:  11/26/2014 Type of visit - description :  Here for Medicare AWV:  1. Risk factors based on Past M, S, F history: reviewed 2. Physical Activities: not very active d/t back pain 3. Depression/mood: neg screening  4. Hearing:  On-off subjective problems, has seen audiology before, we agreed on observation  5. ADL's: independent, drives  6. Fall Risk: had a fall few months ago, no major injury, prevention discussed , see AVS 7. home Safety: does feel safe at home  8. Height, weight, & visual acuity: see VS, sees eye doctor regulalrly 9. Counseling: provided 10. Labs ordered based on risk factors: if needed  11. Referral Coordination: if needed 12. Care Plan, see assessment and plan , written personalized plan provided , see AVS 13. Cognitive Assessment: motor skills and cognition appropriate for age 85. Care team updated, see Dr Nelva Bush, pain med; Dr Johna Roles GI  15. End-of-life care discussed   In addition, today we discussed the following: Pain management, follow-up elsewhere, take naproxen half tablet most days. To have back injections soon Diabetes, ambulatory blood sugars less than 130 Hypertension, good compliance of medication, reports normal ambulatory BPs   Review of Systems    Constitutional: No fever. No chills. No unexplained wt changes. No unusual sweats. Occasionally appetite is slightly low at the day  HEENT: No dental problems, no ear discharge, no facial swelling, no voice changes. No eye discharge, no eye  redness , no  intolerance to light   Respiratory: No wheezing , no  difficulty breathing. No cough , no mucus production  Cardiovascular: No CP,  leg swelling at baseline, no  Palpitations  GI: Occasional nausea, mild but denies vomiting, no diarrhea , no  abdominal pain.  No blood in the stools. No dysphagia, no odynophagia. Occasional constipation, she  thinks related to pain medication    Endocrine: No polyphagia, no polyuria , no polydipsia  GU: No dysuria, gross hematuria, difficulty urinating. No urinary urgency, no frequency.  Musculoskeletal: No joint swellings or unusual aches or pains  Skin: No change in the color of the skin, palor , no  Rash  Allergic, immunologic: No environmental allergies , no  food allergies  Neurological: No dizziness no  syncope. No headaches. No diplopia, no slurred, no slurred speech, no motor deficits, no facial  Numbness  Hematological: No enlarged lymph nodes, no easy bruising , no unusual bleedings  Psychiatry: No suicidal ideas, no hallucinations, no beavior problems, no confusion.  No unusual/severe anxiety, no depression    Past Medical History  Diagnosis Date  . HTN (hypertension)   . Diabetes mellitus   . Hyperparathyroidism   . Back pain, chronic   . Pelvic pain in female 2005     back and pelvic pain, s/p w-u, CT (-), u/s showed a thick endometrium, s/p Bx per gyn  . Ureteric obstruction     hydronephrosis,sees urology  . Hyperlipidemia   . BCC (basal cell carcinoma of skin)     several, nose , 2012 L forehead    Past Surgical History  Procedure Laterality Date  . Lobectomy  1989    s/p  RML lung lobectomy, biopsy showed precancerous changes   . Cystectomy  1989    thyriod  . Polypectomy      colon    History   Social History  . Marital Status: Married  Spouse Name: N/A  . Number of Children: 4  . Years of Education: N/A   Occupational History  . retired Theme park manager    Social History Main Topics  . Smoking status: Never Smoker   . Smokeless tobacco: Never Used  . Alcohol Use: Yes     Comment: Rarely  . Drug Use: No  . Sexual Activity: Not on file   Other Topics Concern  . Not on file   Social History Narrative    Lives w/ husband               Family History  Problem Relation Age of Onset  . Diabetes Brother     X 2  . Hypertension  Brother   . Hypertension Mother   . Brain cancer Mother     tumor  . Breast cancer Neg Hx   . Colon cancer Neg Hx   . CAD Father     MI at 36       Medication List       This list is accurate as of: 11/26/14 11:59 PM.  Always use your most recent med list.               acetaminophen 500 MG tablet  Commonly known as:  TYLENOL  Take 1,250 mg by mouth every 6 (six) hours as needed for mild pain.     albuterol 108 (90 BASE) MCG/ACT inhaler  Commonly known as:  PROVENTIL HFA;VENTOLIN HFA  Inhale 2 puffs into the lungs every 6 (six) hours as needed for wheezing.     aspirin 81 MG tablet  Take 81 mg by mouth daily.     BIOTIN PO  Take by mouth.     calcium carbonate 500 MG chewable tablet  Commonly known as:  TUMS - dosed in mg elemental calcium  Chew 1 tablet by mouth daily.     CINNAMON PO  Take by mouth daily.     EPIPEN 2-PAK 0.3 mg/0.3 mL Soaj injection  Generic drug:  EPINEPHrine  As needed.     famotidine 20 MG tablet  Commonly known as:  PEPCID  Take 20 mg by mouth 2 (two) times daily as needed.     fluticasone 50 MCG/ACT nasal spray  Commonly known as:  FLONASE  Place 2 sprays into the nose daily.     furosemide 20 MG tablet  Commonly known as:  LASIX  take 2 tablets by mouth once daily     HYDROcodone-acetaminophen 10-325 MG per tablet  Commonly known as:  NORCO  Take 1 tablet by mouth every 8 (eight) hours as needed.     lisinopril 20 MG tablet  Commonly known as:  PRINIVIL,ZESTRIL  Take 1 tablet (20 mg total) by mouth daily.     loratadine 10 MG tablet  Commonly known as:  CLARITIN  Take 10 mg by mouth daily.     MAALOX ANTI-GAS PO  Take by mouth as needed. Any anti-gas medication[tablet as needed].     metFORMIN 500 MG tablet  Commonly known as:  GLUCOPHAGE  Take 1 and 1/2 tablets by mouth twice daily.     multivitamin,tx-minerals tablet  Take 1 tablet by mouth daily.     naproxen sodium 220 MG tablet  Commonly known as:  ANAPROX    Take 220 mg by mouth 2 (two) times daily with a meal.     NON FORMULARY  ALLERGY INJECTIONS.     ONE TOUCH ULTRA TEST test strip  Generic drug:  glucose blood  TEST once daily     ONETOUCH DELICA LANCETS FINE Misc  use to TEST ONCE DAILY     tetrahydrozoline 0.05 % ophthalmic solution  Place 2 drops into both eyes daily.           Objective:   Physical Exam BP 126/72 mmHg  Pulse 75  Temp(Src) 98.3 F (36.8 C) (Oral)  Ht 5\' 6"  (1.676 m)  Wt 187 lb 6 oz (84.993 kg)  BMI 30.26 kg/m2  SpO2 97%  General:   Well developed, well nourished . NAD.  Neck:  Full range of motion. Supple. No  Thyromegaly  HEENT:  Normocephalic . Face symmetric, atraumatic Lungs:  CTA B Normal respiratory effort, no intercostal retractions, no accessory muscle use. Heart: RRR,  no murmur.  trace pretibial edema bilaterally  Abdomen:  Not distended, soft, non-tender. No rebound or rigidity. No mass,organomegaly Skin: Exposed areas without rash. Not pale. Not jaundice Diabetic foot exam: Pedal pulses, no edema, pinprick examination decrease worse on the left. Has a callus at the siDE of the left foot. Neurologic:  alert & oriented X3.  Speech normal, gait appropriate for age and unassisted Strength symmetric and appropriate for age.  Psych: Cognition and judgment appear intact.  Cooperative with normal attention span and concentration.  Behavior appropriate. No anxious or depressed appearing.     Assessment & Plan:

## 2014-11-26 NOTE — Assessment & Plan Note (Signed)
Controlled, continue Lasix

## 2014-11-26 NOTE — Assessment & Plan Note (Addendum)
Td 2009 pneumonia shot 04-2005 and 2013 prevnar 2013 Shingles immunization --2011   Female care per gynecology, due for a visit ;  mammogram 06-2014 neg  DEXA 11-2006 , 12-2008 and 02-2013 -----> normal.   Plan-- continue ca and vit D last colonoscopy 7-11, biopsy showed a tubular adenoma, due per  GI letter  , pt plans to call    u/s 05-2011 neg for AAA Diet-discussed

## 2014-11-26 NOTE — Patient Instructions (Signed)
Get your blood work before you leave    Please consider visit these websites for more information:  www.begintheconversation.org  theconversationproject.org    Fall Prevention and Home Safety Falls cause injuries and can affect all age groups. It is possible to use preventive measures to significantly decrease the likelihood of falls. There are many simple measures which can make your home safer and prevent falls. OUTDOORS  Repair cracks and edges of walkways and driveways.  Remove high doorway thresholds.  Trim shrubbery on the main path into your home.  Have good outside lighting.  Clear walkways of tools, rocks, debris, and clutter.  Check that handrails are not broken and are securely fastened. Both sides of steps should have handrails.  Have leaves, snow, and ice cleared regularly.  Use sand or salt on walkways during winter months.  In the garage, clean up grease or oil spills. BATHROOM  Install night lights.  Install grab bars by the toilet and in the tub and shower.  Use non-skid mats or decals in the tub or shower.  Place a plastic non-slip stool in the shower to sit on, if needed.  Keep floors dry and clean up all water on the floor immediately.  Remove soap buildup in the tub or shower on a regular basis.  Secure bath mats with non-slip, double-sided rug tape.  Remove throw rugs and tripping hazards from the floors. BEDROOMS  Install night lights.  Make sure a bedside light is easy to reach.  Do not use oversized bedding.  Keep a telephone by your bedside.  Have a firm chair with side arms to use for getting dressed.  Remove throw rugs and tripping hazards from the floor. KITCHEN  Keep handles on pots and pans turned toward the center of the stove. Use back burners when possible.  Clean up spills quickly and allow time for drying.  Avoid walking on wet floors.  Avoid hot utensils and knives.  Position shelves so they are not too high  or low.  Place commonly used objects within easy reach.  If necessary, use a sturdy step stool with a grab bar when reaching.  Keep electrical cables out of the way.  Do not use floor polish or wax that makes floors slippery. If you must use wax, use non-skid floor wax.  Remove throw rugs and tripping hazards from the floor. STAIRWAYS  Never leave objects on stairs.  Place handrails on both sides of stairways and use them. Fix any loose handrails. Make sure handrails on both sides of the stairways are as long as the stairs.  Check carpeting to make sure it is firmly attached along stairs. Make repairs to worn or loose carpet promptly.  Avoid placing throw rugs at the top or bottom of stairways, or properly secure the rug with carpet tape to prevent slippage. Get rid of throw rugs, if possible.  Have an electrician put in a light switch at the top and bottom of the stairs. OTHER FALL PREVENTION TIPS  Wear low-heel or rubber-soled shoes that are supportive and fit well. Wear closed toe shoes.  When using a stepladder, make sure it is fully opened and both spreaders are firmly locked. Do not climb a closed stepladder.  Add color or contrast paint or tape to grab bars and handrails in your home. Place contrasting color strips on first and last steps.  Learn and use mobility aids as needed. Install an electrical emergency response system.  Turn on lights to avoid dark areas.   Replace light bulbs that burn out immediately. Get light switches that glow.  Arrange furniture to create clear pathways. Keep furniture in the same place.  Firmly attach carpet with non-skid or double-sided tape.  Eliminate uneven floor surfaces.  Select a carpet pattern that does not visually hide the edge of steps.  Be aware of all pets. OTHER HOME SAFETY TIPS  Set the water temperature for 120 F (48.8 C).  Keep emergency numbers on or near the telephone.  Keep smoke detectors on every level of  the home and near sleeping areas. Document Released: 04/23/2002 Document Revised: 11/02/2011 Document Reviewed: 07/23/2011 ExitCare Patient Information 2015 [employer]. This information is not intended to replace advice given to you by your health care provider. Make sure you discuss any questions you have with your health care provider.   Preventive Care for Adults Ages 65 and over  Blood pressure check.** / Every 1 to 2 years.  Lipid and cholesterol check.**/ Every 5 years beginning at age 20.  Lung cancer screening. / Every year if you are aged 55-80 years and have a 30-pack-year history of smoking and currently smoke or have quit within the past 15 years. Yearly screening is stopped once you have quit smoking for at least 15 years or develop a health problem that would prevent you from having lung cancer treatment.  Fecal occult blood test (FOBT) of stool. / Every year beginning at age 50 and continuing until age 75. You may not have to do this test if you get a colonoscopy every 10 years.  Flexible sigmoidoscopy** or colonoscopy.** / Every 5 years for a flexible sigmoidoscopy or every 10 years for a colonoscopy beginning at age 50 and continuing until age 75.  Hepatitis C blood test.** / For all people born from 1945 through 1965 and any individual with known risks for hepatitis C.  Abdominal aortic aneurysm (AAA) screening.** / A one-time screening for ages 65 to 75 years who are current or former smokers.  Skin self-exam. / Monthly.  Influenza vaccine. / Every year.  Tetanus, diphtheria, and acellular pertussis (Tdap/Td) vaccine.** / 1 dose of Td every 10 years.  Varicella vaccine.** / Consult your health care provider.  Zoster vaccine.** / 1 dose for adults aged 60 years or older.  Pneumococcal 13-valent conjugate (PCV13) vaccine.** / Consult your health care provider.  Pneumococcal polysaccharide (PPSV23) vaccine.** / 1 dose for all adults aged 65 years and  older.  Meningococcal vaccine.** / Consult your health care provider.  Hepatitis A vaccine.** / Consult your health care provider.  Hepatitis B vaccine.** / Consult your health care provider.  Haemophilus influenzae type b (Hib) vaccine.** / Consult your health care provider. **Family history and personal history of risk and conditions may change your health care provider's recommendations. Document Released: 06/29/2001 Document Revised: 05/08/2013 Document Reviewed: 09/28/2010 ExitCare Patient Information 2015 [employer]. This information is not intended to replace advice given to you by your health care provider. Make sure you discuss any questions you have with your health care provider.   

## 2014-11-27 LAB — CBC WITH DIFFERENTIAL/PLATELET
Basophils Absolute: 0.2 10*3/uL — ABNORMAL HIGH (ref 0.0–0.1)
Basophils Relative: 2.7 % (ref 0.0–3.0)
Eosinophils Absolute: 0.3 10*3/uL (ref 0.0–0.7)
Eosinophils Relative: 2.8 % (ref 0.0–5.0)
HCT: 39.4 % (ref 36.0–46.0)
Hemoglobin: 13.2 g/dL (ref 12.0–15.0)
Lymphocytes Relative: 39.7 % (ref 12.0–46.0)
Lymphs Abs: 3.7 10*3/uL (ref 0.7–4.0)
MCHC: 33.5 g/dL (ref 30.0–36.0)
MCV: 88.9 fl (ref 78.0–100.0)
Monocytes Absolute: 0.7 10*3/uL (ref 0.1–1.0)
Monocytes Relative: 7.8 % (ref 3.0–12.0)
Neutro Abs: 4.4 10*3/uL (ref 1.4–7.7)
Neutrophils Relative %: 47 % (ref 43.0–77.0)
Platelets: 344 10*3/uL (ref 150.0–400.0)
RBC: 4.44 Mil/uL (ref 3.87–5.11)
RDW: 13.7 % (ref 11.5–15.5)
WBC: 9.3 10*3/uL (ref 4.0–10.5)

## 2014-11-27 LAB — AST: AST: 21 U/L (ref 0–37)

## 2014-11-27 LAB — BASIC METABOLIC PANEL
BUN: 17 mg/dL (ref 6–23)
CO2: 28 mEq/L (ref 19–32)
Calcium: 10.1 mg/dL (ref 8.4–10.5)
Chloride: 99 mEq/L (ref 96–112)
Creatinine, Ser: 0.88 mg/dL (ref 0.40–1.20)
GFR: 65.76 mL/min (ref >60.00)
Glucose, Bld: 90 mg/dL (ref 70–99)
Potassium: 4.2 mEq/L (ref 3.5–5.1)
Sodium: 137 mEq/L (ref 135–145)

## 2014-11-27 LAB — HEMOGLOBIN A1C: Hgb A1c MFr Bld: 6 % (ref 4.6–6.5)

## 2014-11-27 LAB — ALT: ALT: 16 U/L (ref 0–35)

## 2014-12-25 NOTE — Telephone Encounter (Signed)
A user error has taken place.

## 2015-03-07 ENCOUNTER — Other Ambulatory Visit: Payer: Self-pay | Admitting: Internal Medicine

## 2015-03-10 ENCOUNTER — Other Ambulatory Visit: Payer: Self-pay | Admitting: Internal Medicine

## 2015-04-11 ENCOUNTER — Encounter: Payer: Self-pay | Admitting: Internal Medicine

## 2015-04-30 ENCOUNTER — Other Ambulatory Visit: Payer: Self-pay | Admitting: Internal Medicine

## 2015-05-30 ENCOUNTER — Encounter: Payer: Self-pay | Admitting: Internal Medicine

## 2015-05-30 ENCOUNTER — Ambulatory Visit (INDEPENDENT_AMBULATORY_CARE_PROVIDER_SITE_OTHER): Payer: Medicare Other | Admitting: Internal Medicine

## 2015-05-30 VITALS — BP 126/74 | HR 84 | Temp 98.1°F | Ht 66.0 in | Wt 190.0 lb

## 2015-05-30 DIAGNOSIS — I10 Essential (primary) hypertension: Secondary | ICD-10-CM | POA: Diagnosis not present

## 2015-05-30 DIAGNOSIS — E119 Type 2 diabetes mellitus without complications: Secondary | ICD-10-CM | POA: Diagnosis not present

## 2015-05-30 LAB — BASIC METABOLIC PANEL
BUN: 19 mg/dL (ref 7–25)
CO2: 27 mmol/L (ref 20–31)
Calcium: 10.1 mg/dL (ref 8.6–10.4)
Chloride: 100 mmol/L (ref 98–110)
Creat: 0.82 mg/dL (ref 0.60–0.88)
Glucose, Bld: 102 mg/dL — ABNORMAL HIGH (ref 65–99)
Potassium: 4.4 mmol/L (ref 3.5–5.3)
Sodium: 140 mmol/L (ref 135–146)

## 2015-05-30 LAB — HEMOGLOBIN A1C
Hgb A1c MFr Bld: 6.2 % — ABNORMAL HIGH (ref <5.7)
Mean Plasma Glucose: 131 mg/dL — ABNORMAL HIGH (ref <117)

## 2015-05-30 NOTE — Progress Notes (Signed)
Pre visit review using our clinic review tool, if applicable. No additional management support is needed unless otherwise documented below in the visit note. 

## 2015-05-30 NOTE — Patient Instructions (Signed)
BEFORE YOU LEAVE THE OFFICE:  GO TO THE LAB  Get the blood work    GO TO THE FRONT DESK Schedule a complete physical exam to be done in 6 months  No  fasting

## 2015-05-30 NOTE — Progress Notes (Signed)
Subjective:    Patient ID: Natalie [name], female    DOB: [birth year], 82 y.o.   MRN: [id number]  DOS:  05/30/2015 Type of visit - description : Routine office visit Interval history: Good compliance with medications except for Lasix, she is supposed to take 2 tablets daily but is taking one half a day because make her urinate too much. DM: Ambulatory CBGs usually in the 120s.    Review of Systems When asked, states that she is not feeling that well. "I think I overdid it during Christmas". She however denies fever, chills, chest pain or difficulty breathing. Edema slightly increased from baseline since she is taking less Lasix. Denies actual DOE or orthopnea. She has chronic back  pain and is not under ideal control. From time to time gets depressed about it. Denies any headaches, myalgias.   Past Medical History  Diagnosis Date  . HTN (hypertension)   . Diabetes mellitus   . Hyperparathyroidism   . Back pain, chronic   . Pelvic pain in female 2005     back and pelvic pain, s/p w-u, CT (-), u/s showed a thick endometrium, s/p Bx per gyn  . Ureteric obstruction     hydronephrosis,sees urology  . Hyperlipidemia   . BCC (basal cell carcinoma of skin)     several, nose , 2012 L forehead  . Lumbago     Lumbar epidural steroid injection at L5-S1 midline by Dr. Nelva Bush  . Degeneration of lumbar intervertebral disc     Dr. Nelva Bush    Past Surgical History  Procedure Laterality Date  . Lobectomy  1989    s/p  RML lung lobectomy, biopsy showed precancerous changes   . Cystectomy  1989    thyriod  . Polypectomy      colon    Social History   Social History  . Marital Status: Married    Spouse Name: N/A  . Number of Children: 4  . Years of Education: N/A   Occupational History  . retired Theme park manager    Social History Main Topics  . Smoking status: Never Smoker   . Smokeless tobacco: Never Used  . Alcohol Use: Yes     Comment: Rarely  . Drug Use: No  . Sexual  Activity: Not on file   Other Topics Concern  . Not on file   Social History Narrative    Lives w/ husband                  Medication List       This list is accurate as of: 05/30/15  4:52 PM.  Always use your most recent med list.               acetaminophen 500 MG tablet  Commonly known as:  TYLENOL  Take 1,250 mg by mouth every 6 (six) hours as needed for mild pain.     albuterol 108 (90 Base) MCG/ACT inhaler  Commonly known as:  PROVENTIL HFA;VENTOLIN HFA  Inhale 2 puffs into the lungs every 6 (six) hours as needed for wheezing.     aspirin 81 MG tablet  Take 81 mg by mouth daily. Reported on 05/30/2015     BIOTIN PO  Take by mouth.     calcium carbonate 500 MG chewable tablet  Commonly known as:  TUMS - dosed in mg elemental calcium  Chew 1 tablet by mouth daily.     CINNAMON PO  Take by mouth daily. Reported on  05/30/2015     EPIPEN 2-PAK 0.3 mg/0.3 mL Soaj injection  Generic drug:  EPINEPHrine  Reported on 05/30/2015     famotidine 20 MG tablet  Commonly known as:  PEPCID  Take 20 mg by mouth 2 (two) times daily as needed.     fluticasone 50 MCG/ACT nasal spray  Commonly known as:  FLONASE  Place 2 sprays into the nose daily.     furosemide 20 MG tablet  Commonly known as:  LASIX  Take 30 mg by mouth.     glucose blood test strip  Commonly known as:  ONE TOUCH ULTRA TEST  Check blood sugar once daily.     HYDROcodone-acetaminophen 10-325 MG tablet  Commonly known as:  NORCO  Take 1 tablet by mouth every 8 (eight) hours as needed.     lisinopril 20 MG tablet  Commonly known as:  PRINIVIL,ZESTRIL  Take 1 tablet (20 mg total) by mouth daily.     loratadine 10 MG tablet  Commonly known as:  CLARITIN  Take 10 mg by mouth daily.     MAALOX ANTI-GAS PO  Take by mouth as needed. Any anti-gas medication[tablet as needed].     metFORMIN 500 MG tablet  Commonly known as:  GLUCOPHAGE  Take 1.5 tablets (750 mg total) by mouth 2 (two) times daily  with a meal.     multivitamin,tx-minerals tablet  Take 1 tablet by mouth daily.     naproxen sodium 220 MG tablet  Commonly known as:  ANAPROX  Take 220 mg by mouth daily.     NON FORMULARY  ALLERGY INJECTIONS.     ONETOUCH DELICA LANCETS FINE Misc  use to TEST ONCE DAILY     tetrahydrozoline 0.05 % ophthalmic solution  Place 2 drops into both eyes daily.           Objective:   Physical Exam BP 126/74 mmHg  Pulse 84  Temp(Src) 98.1 F (36.7 C) (Oral)  Ht 5\' 6"  (1.676 m)  Wt 190 lb (86.183 kg)  BMI 30.68 kg/m2  SpO2 96% General:   Well developed, well nourished . NAD.  HEENT:  Normocephalic . Face symmetric, atraumatic Lungs:  CTA B Normal respiratory effort, no intercostal retractions, no accessory muscle use. Heart: RRR,  no murmur.  +/+++ pretibial edema bilaterally  Skin: Not pale. Not jaundice Neurologic:  alert & oriented X3.  Speech normal, gait appropriate for age and unassisted Psych--  Cognition and judgment appear intact.  Cooperative with normal attention span and concentration.  Behavior appropriate. No anxious or depressed appearing.      Assessment & Plan:   Assessment DM, complicated by neuropathy and callus HTN Hyperlipidemia, intolerant to medications  H/o hyperparathyroidism GU: Hydronephrosis , sees urology MSK: --DJD --Chronic back pain, dr Nelva Bush  [facility] History of pelvic pain, 2005, w/u per gyn RML Lobectomy, 1989, bx precancerous cells  PLAN DM: Check A1c. HTN: Seems controlled, despite taking less Lasix. She does have some edema, slightly above baseline, as long as the edema is not getting worse,  okay to continue with Lasix 1.5 tablets a day. Check a BMP "Not feeling well": She looks at baseline, review of systems negative. Recommend observation for now, if she is not getting better she will let me know RTC 6 months

## 2015-09-05 ENCOUNTER — Other Ambulatory Visit: Payer: Self-pay

## 2015-09-05 MED ORDER — LISINOPRIL 20 MG PO TABS: 20.0000 mg | ORAL_TABLET | Freq: Every day | ORAL | Status: DC

## 2015-09-09 LAB — HM MAMMOGRAPHY

## 2015-09-22 ENCOUNTER — Encounter: Payer: Self-pay | Admitting: Internal Medicine

## 2015-10-06 ENCOUNTER — Ambulatory Visit
Admission: RE | Admit: 2015-10-06 | Discharge: 2015-10-06 | Disposition: A | Discharge status: Home / Self Care | Payer: Medicare Other | Source: Ambulatory Visit | Attending: Allergy | Admitting: Allergy

## 2015-10-06 ENCOUNTER — Other Ambulatory Visit: Payer: Self-pay | Admitting: Allergy

## 2015-10-06 DIAGNOSIS — J40 Bronchitis, not specified as acute or chronic: Secondary | ICD-10-CM

## 2015-11-11 ENCOUNTER — Other Ambulatory Visit: Payer: Self-pay

## 2015-11-11 MED ORDER — METFORMIN HCL 500 MG PO TABS: 750.0000 mg | ORAL_TABLET | Freq: Two times a day (BID) | ORAL | Status: DC

## 2015-11-27 ENCOUNTER — Encounter: Payer: Self-pay | Admitting: Internal Medicine

## 2015-11-27 ENCOUNTER — Ambulatory Visit (INDEPENDENT_AMBULATORY_CARE_PROVIDER_SITE_OTHER): Payer: Medicare Other | Admitting: Internal Medicine

## 2015-11-27 VITALS — BP 122/78 | HR 73 | Temp 98.1°F | Ht 66.0 in | Wt 190.5 lb

## 2015-11-27 DIAGNOSIS — E118 Type 2 diabetes mellitus with unspecified complications: Secondary | ICD-10-CM | POA: Diagnosis not present

## 2015-11-27 DIAGNOSIS — Z09 Encounter for follow-up examination after completed treatment for conditions other than malignant neoplasm: Secondary | ICD-10-CM | POA: Insufficient documentation

## 2015-11-27 DIAGNOSIS — I10 Essential (primary) hypertension: Secondary | ICD-10-CM

## 2015-11-27 DIAGNOSIS — Z0000 Encounter for general adult medical examination without abnormal findings: Secondary | ICD-10-CM | POA: Diagnosis not present

## 2015-11-27 LAB — BASIC METABOLIC PANEL
BUN: 14 mg/dL (ref 6–23)
CO2: 29 mEq/L (ref 19–32)
Calcium: 9.9 mg/dL (ref 8.4–10.5)
Chloride: 101 mEq/L (ref 96–112)
Creatinine, Ser: 0.77 mg/dL (ref 0.40–1.20)
GFR: 76.53 mL/min (ref >60.00)
Glucose, Bld: 110 mg/dL — ABNORMAL HIGH (ref 70–99)
Potassium: 4.4 mEq/L (ref 3.5–5.1)
Sodium: 135 mEq/L (ref 135–145)

## 2015-11-27 LAB — CBC WITH DIFFERENTIAL/PLATELET
Basophils Absolute: 0 10*3/uL (ref 0.0–0.1)
Basophils Relative: 0.7 % (ref 0.0–3.0)
Eosinophils Absolute: 0.2 10*3/uL (ref 0.0–0.7)
Eosinophils Relative: 2.4 % (ref 0.0–5.0)
HCT: 37.7 % (ref 36.0–46.0)
Hemoglobin: 12.5 g/dL (ref 12.0–15.0)
Lymphocytes Relative: 37 % (ref 12.0–46.0)
Lymphs Abs: 2.8 10*3/uL (ref 0.7–4.0)
MCHC: 33.1 g/dL (ref 30.0–36.0)
MCV: 88.7 fl (ref 78.0–100.0)
Monocytes Absolute: 0.7 10*3/uL (ref 0.1–1.0)
Monocytes Relative: 8.9 % (ref 3.0–12.0)
Neutro Abs: 3.9 10*3/uL (ref 1.4–7.7)
Neutrophils Relative %: 51 % (ref 43.0–77.0)
Platelets: 284 10*3/uL (ref 150.0–400.0)
RBC: 4.25 Mil/uL (ref 3.87–5.11)
RDW: 14.2 % (ref 11.5–15.5)
WBC: 7.6 10*3/uL (ref 4.0–10.5)

## 2015-11-27 LAB — AST: AST: 23 U/L (ref 0–37)

## 2015-11-27 LAB — ALT: ALT: 18 U/L (ref 0–35)

## 2015-11-27 LAB — HEMOGLOBIN A1C: Hgb A1c MFr Bld: 6.1 % (ref 4.6–6.5)

## 2015-11-27 NOTE — Progress Notes (Signed)
Subjective:    Patient ID: Natalie [name], female    DOB: August 19, 1932, 82 y.o.   MRN: [id number]  DOS:  11/27/2015 Type of visit - description : Complete physical exam we also addressed other issues DM: Good compliance of medication, ambulatory blood sugars varies throughout the day but usually okay HTN: Good med compliance, ambulatory BPs within normal.    Review of Systems Constitutional: No fever. No chills. Occasional facial flushing without associated symptoms. No unexplained wt changes. No unusual sweats  HEENT: No dental problems, no ear discharge, no facial swelling, no voice changes. No eye discharge, no eye  redness , no  intolerance to light   Respiratory: No wheezing , no  difficulty breathing. No cough , no mucus production  Cardiovascular: No CP, no palpitations, + leg swelling mild and at baseline.  GI: no nausea, no vomiting, no diarrhea. Occasional lower abdominal postprandial discomfort, symptoms are not consistent or daily, usually associated with certain foods such as corn No blood in the stools. No dysphagia, no odynophagia    Endocrine: No polyphagia, no polyuria , no polydipsia  GU: No dysuria, gross hematuria, difficulty urinating. No urinary urgency, no frequency.  Musculoskeletal: No joint swellings or unusual aches or pains  Skin: No change in the color of the skin, palor , no  Rash  Allergic, immunologic: No environmental allergies , no  food allergies  Neurological: No dizziness no  syncope. No headaches. No diplopia, no slurred, no slurred speech, no motor deficits, no facial  Numbness + Lower extremity paresthesias, distal, slightly worse on the left foot Hematological: No enlarged lymph nodes, no easy bruising , no unusual bleedings  Psychiatry: No suicidal ideas, no hallucinations, no beavior problems, no confusion.  No unusual/severe anxiety, no depression   Past Medical History  Diagnosis Date  . HTN (hypertension)   . Diabetes  mellitus   . Hyperparathyroidism   . Back pain, chronic   . Pelvic pain in female 2005     back and pelvic pain, s/p w-u, CT (-), u/s showed a thick endometrium, s/p Bx per gyn  . Ureteric obstruction     hydronephrosis,sees urology  . Hyperlipidemia   . BCC (basal cell carcinoma of skin)     several, nose , 2012 L forehead  . Lumbago     Lumbar epidural steroid injection at L5-S1 midline by Dr. Nelva Bush  . Degeneration of lumbar intervertebral disc     Dr. Nelva Bush    Past Surgical History  Procedure Laterality Date  . Lobectomy  1989    s/p  RML lung lobectomy, biopsy showed precancerous changes   . Cystectomy  1989    thyriod  . Polypectomy      colon    Social History   Social History  . Marital Status: Married    Spouse Name: N/A  . Number of Children: 4  . Years of Education: N/A   Occupational History  . retired Theme park manager    Social History Main Topics  . Smoking status: Never Smoker   . Smokeless tobacco: Never Used  . Alcohol Use: Yes     Comment: Rarely  . Drug Use: No  . Sexual Activity: Not on file   Other Topics Concern  . Not on file   Social History Narrative   Lives w/ husband                  Family History  Problem Relation Age of Onset  . Diabetes  Brother     X 2  . Hypertension Brother   . Hypertension Mother   . Brain cancer Mother     tumor  . Breast cancer Neg Hx   . Colon cancer Neg Hx   . CAD Father     MI at 63       Medication List       This list is accurate as of: 11/27/15 12:44 PM.  Always use your most recent med list.               acetaminophen 500 MG tablet  Commonly known as:  TYLENOL  Take 1,250 mg by mouth every 6 (six) hours as needed for mild pain.     albuterol 108 (90 Base) MCG/ACT inhaler  Commonly known as:  PROVENTIL HFA;VENTOLIN HFA  Inhale 2 puffs into the lungs every 6 (six) hours as needed for wheezing.     aspirin 81 MG tablet  Take 81 mg by mouth daily. Reported on 05/30/2015       beclomethasone 40 MCG/ACT inhaler  Commonly known as:  QVAR  Inhale 1-2 puffs into the lungs 2 (two) times daily.     BIOTIN PO  Take by mouth.     calcium carbonate 500 MG chewable tablet  Commonly known as:  TUMS - dosed in mg elemental calcium  Chew 1 tablet by mouth daily.     CINNAMON PO  Take by mouth daily. Reported on 05/30/2015     EPIPEN 2-PAK 0.3 mg/0.3 mL Soaj injection  Generic drug:  EPINEPHrine  Reported on 11/27/2015     famotidine 20 MG tablet  Commonly known as:  PEPCID  Take 20 mg by mouth 2 (two) times daily as needed.     fluticasone 50 MCG/ACT nasal spray  Commonly known as:  FLONASE  Place 2 sprays into the nose daily.     furosemide 20 MG tablet  Commonly known as:  LASIX  Take 30 mg by mouth.     glucose blood test strip  Commonly known as:  ONE TOUCH ULTRA TEST  Check blood sugar once daily.     HYDROcodone-acetaminophen 10-325 MG tablet  Commonly known as:  NORCO  Take 1 tablet by mouth every 8 (eight) hours as needed.     lisinopril 20 MG tablet  Commonly known as:  PRINIVIL,ZESTRIL  Take 1 tablet (20 mg total) by mouth daily.     loratadine 10 MG tablet  Commonly known as:  CLARITIN  Take 10 mg by mouth daily.     MAALOX ANTI-GAS PO  Take by mouth as needed. Any anti-gas medication[tablet as needed].     metFORMIN 500 MG tablet  Commonly known as:  GLUCOPHAGE  Take 1.5 tablets (750 mg total) by mouth 2 (two) times daily with a meal.     multivitamin,tx-minerals tablet  Take 1 tablet by mouth daily.     naproxen sodium 220 MG tablet  Commonly known as:  ANAPROX  Take 220 mg by mouth daily.     NON FORMULARY  ALLERGY INJECTIONS.     ONETOUCH DELICA LANCETS FINE Misc  use to TEST ONCE DAILY     tetrahydrozoline 0.05 % ophthalmic solution  Place 2 drops into both eyes daily.           Objective:   Physical Exam BP 122/78 mmHg  Pulse 73  Temp(Src) 98.1 F (36.7 C) (Oral)  Ht 5\' 6"  (1.676 m)  Wt 190 lb 8 oz (  86.41  kg)  BMI 30.76 kg/m2  SpO2 96%  General:   Well developed, well nourished . NAD.  Neck: No  thyromegaly  HEENT:  Normocephalic . Face symmetric, atraumatic Lungs:  CTA B Normal respiratory effort, no intercostal retractions, no accessory muscle use. Heart: RRR,  no murmur.  Trace distal  pretibial edema bilaterally  Abdomen:  Not distended, soft, non-tender. No rebound or rigidity.  No bruit Skin: Exposed areas without rash. Not pale. Not jaundice DIABETIC FEET EXAM: Normal pedal pulses bilaterally Skin normal, nails normal, no calluses Pinprick examination of the feet: Patchy, mild decrease sensation, slightly more noticeable on the L Neurologic:  alert & oriented X3.  Speech normal, gait appropriate for age and unassisted Strength symmetric and appropriate for age.  Psych: Cognition and judgment appear intact.  Cooperative with normal attention span and concentration.  Behavior appropriate. No anxious or depressed appearing.    Assessment & Plan:   Assessment DM, complicated by neuropathy and callus HTN Hyperlipidemia, intolerant to medications  Allergies, B-spasm, get shots----- see Dr Donneta Romberg  GU: Hydronephrosis , used to see  urology MSK: --DJD --Chronic back pain, dr Nelva Bush , he rx hydrocodone BCC H/o pelvic pain, 2005, w/u per gyn H/o hyperparathyroidism H/o RML Lobectomy, 1989, bx precancerous cells  PLAN DM: Continue with metformin, ACEi. Checking a A1c. Has neuropathy, feet care discussed. HTN: Seems controlled on Lasix, lisinopril. Checking labs. Lower abdominal discomfort postprandially: Exam normal, no bruit, recommend observation for now H/o BCC-- due to see dermatology, declined at the referral RTC 6 months

## 2015-11-27 NOTE — Patient Instructions (Signed)
Get your blood work before you leave    Please contact her dermatologist for a checkup  Consider having a health care power of attorney  Next visit in 6 months, no fasting    Diabetes and Foot Care Diabetes may cause you to have problems because of poor blood supply (circulation) to your feet and legs. This may cause the skin on your feet to become thinner, break easier, and heal more slowly. Your skin may become dry, and the skin may peel and crack. You may also have nerve damage in your legs and feet causing decreased feeling in them. You may not notice minor injuries to your feet that could lead to infections or more serious problems. Taking care of your feet is one of the most important things you can do for yourself.  HOME CARE INSTRUCTIONS  Wear shoes at all times, even in the house. Do not go barefoot. Bare feet are easily injured.  Check your feet daily for blisters, cuts, and redness. If you cannot see the bottom of your feet, use a mirror or ask someone for help.  Wash your feet with warm water (do not use hot water) and mild soap. Then pat your feet and the areas between your toes until they are completely dry. Do not soak your feet as this can dry your skin.  Apply a moisturizing lotion or petroleum jelly (that does not contain alcohol and is unscented) to the skin on your feet and to dry, brittle toenails. Do not apply lotion between your toes.  Trim your toenails straight across. Do not dig under them or around the cuticle. File the edges of your nails with an emery board or nail file.  Do not cut corns or calluses or try to remove them with medicine.  Wear clean socks or stockings every day. Make sure they are not too tight. Do not wear knee-high stockings since they may decrease blood flow to your legs.  Wear shoes that fit properly and have enough cushioning. To break in new shoes, wear them for just a few hours a day. This prevents you from injuring your feet. Always  look in your shoes before you put them on to be sure there are no objects inside.  Do not cross your legs. This may decrease the blood flow to your feet.  If you find a minor scrape, cut, or break in the skin on your feet, keep it and the skin around it clean and dry. These areas may be cleansed with mild soap and water. Do not cleanse the area with peroxide, alcohol, or iodine.  When you remove an adhesive bandage, be sure not to damage the skin around it.  If you have a wound, look at it several times a day to make sure it is healing.  Do not use heating pads or hot water bottles. They may burn your skin. If you have lost feeling in your feet or legs, you may not know it is happening until it is too late.  Make sure your health care provider performs a complete foot exam at least annually or more often if you have foot problems. Report any cuts, sores, or bruises to your health care provider immediately. SEEK MEDICAL CARE IF:   You have an injury that is not healing.  You have cuts or breaks in the skin.  You have an ingrown nail.  You notice redness on your legs or feet.  You feel burning or tingling in your legs  or feet.  You have pain or cramps in your legs and feet.  Your legs or feet are numb.  Your feet always feel cold. SEEK IMMEDIATE MEDICAL CARE IF:   There is increasing redness, swelling, or pain in or around a wound.  There is a red line that goes up your leg.  Pus is coming from a wound.  You develop a fever or as directed by your health care provider.  You notice a bad smell coming from an ulcer or wound.   This information is not intended to replace advice given to you by your health care provider. Make sure you discuss any questions you have with your health care provider.   Document Released: 04/30/2000 Document Revised: 01/03/2013 Document Reviewed: 10/10/2012 Elsevier Interactive Patient Education 2016 [place] cause injuries and can affect all age groups. It is possible to use preventive measures to significantly decrease the likelihood of falls. There are many simple measures which can make your home safer and prevent falls. OUTDOORS  Repair cracks and edges of walkways and driveways.  Remove high doorway thresholds.  Trim shrubbery on the main path into your home.  Have good outside lighting.  Clear walkways of tools, rocks, debris, and clutter.  Check that handrails are not broken and are securely fastened. Both sides of steps should have handrails.  Have leaves, snow, and ice cleared regularly.  Use sand or salt on walkways during winter months.  In the garage, clean up grease or oil spills. BATHROOM  Install night lights.  Install grab bars by the toilet and in the tub and shower.  Use non-skid mats or decals in the tub or shower.  Place a plastic non-slip stool in the shower to sit on, if needed.  Keep floors dry and clean up all water on the floor immediately.  Remove soap buildup in the tub or shower on a regular basis.  Secure bath mats with non-slip, double-sided rug tape.  Remove throw rugs and tripping hazards from the floors. BEDROOMS  Install night lights.  Make sure a bedside light is easy to reach.  Do not use oversized bedding.  Keep a telephone by your bedside.  Have a firm chair with side arms to use for getting dressed.  Remove throw rugs and tripping hazards from the floor. KITCHEN  Keep handles on pots and pans turned toward the center of the stove. Use back burners when possible.  Clean up spills quickly and allow time for drying.  Avoid walking on wet floors.  Avoid hot utensils and knives.  Position shelves so they are not too high or low.  Place commonly used objects within easy reach.  If necessary, use a sturdy step stool with a grab bar when reaching.  Keep electrical cables out of the way.  Do not use floor  polish or wax that makes floors slippery. If you must use wax, use non-skid floor wax.  Remove throw rugs and tripping hazards from the floor. STAIRWAYS  Never leave objects on stairs.  Place handrails on both sides of stairways and use them. Fix any loose handrails. Make sure handrails on both sides of the stairways are as long as the stairs.  Check carpeting to make sure it is firmly attached along stairs. Make repairs to worn or loose carpet promptly.  Avoid placing throw rugs at the top or bottom of stairways, or properly secure the rug with carpet tape to  prevent slippage. Get rid of throw rugs, if possible.  Have an electrician put in a light switch at the top and bottom of the stairs. OTHER FALL PREVENTION TIPS  Wear low-heel or rubber-soled shoes that are supportive and fit well. Wear closed toe shoes.  When using a stepladder, make sure it is fully opened and both spreaders are firmly locked. Do not climb a closed stepladder.  Add color or contrast paint or tape to grab bars and handrails in your home. Place contrasting color strips on first and last steps.  Learn and use mobility aids as needed. Install an electrical emergency response system.  Turn on lights to avoid dark areas. Replace light bulbs that burn out immediately. Get light switches that glow.  Arrange furniture to create clear pathways. Keep furniture in the same place.  Firmly attach carpet with non-skid or double-sided tape.  Eliminate uneven floor surfaces.  Select a carpet pattern that does not visually hide the edge of steps.  Be aware of all pets. OTHER HOME SAFETY TIPS  Set the water temperature for 120 F (48.8 C).  Keep emergency numbers on or near the telephone.  Keep smoke detectors on every level of the home and near sleeping areas. Document Released: 04/23/2002 Document Revised: 11/02/2011 Document Reviewed: 07/23/2011 [facility] Patient Information 2015 [place], [place]. This information  is not intended to replace advice given to you by your health care provider. Make sure you discuss any questions you have with your health care provider.   Preventive Care for Adults Ages 87 and over  Blood pressure check.** / Every 1 to 2 years.  Lipid and cholesterol check.**/ Every 5 years beginning at age 12.  Lung cancer screening. / Every year if you are aged 65-80 years and have a 30-pack-year history of smoking and currently smoke or have quit within the past 15 years. Yearly screening is stopped once you have quit smoking for at least 15 years or develop a health problem that would prevent you from having lung cancer treatment.  Fecal occult blood test (FOBT) of stool. / Every year beginning at age 73 and continuing until age 56. You may not have to do this test if you get a colonoscopy every 10 years.  Flexible sigmoidoscopy** or colonoscopy.** / Every 5 years for a flexible sigmoidoscopy or every 10 years for a colonoscopy beginning at age 56 and continuing until age 40.  Hepatitis C blood test.** / For all people born from [birth year] through 1963 and any individual with known risks for hepatitis C.  Abdominal aortic aneurysm (AAA) screening.** / A one-time screening for ages 7 to 85 years who are current or former smokers.  Skin self-exam. / Monthly.  Influenza vaccine. / Every year.  Tetanus, diphtheria, and acellular pertussis (Tdap/Td) vaccine.** / 1 dose of Td every 10 years.  Varicella vaccine.** / Consult your health care provider.  Zoster vaccine.** / 1 dose for adults aged 35 years or older.  Pneumococcal 13-valent conjugate (PCV13) vaccine.** / Consult your health care provider.  Pneumococcal polysaccharide (PPSV23) vaccine.** / 1 dose for all adults aged 4 years and older.  Meningococcal vaccine.** / Consult your health care provider.  Hepatitis A vaccine.** / Consult your health care provider.  Hepatitis B vaccine.** / Consult your health care  provider.  Haemophilus influenzae type b (Hib) vaccine.** / Consult your health care provider. **Family history and personal history of risk and conditions may change your health care provider's recommendations. Document Released: 06/29/2001 Document Revised: 05/08/2013  Document Reviewed: 09/28/2010 [facility] Patient Information 2015 [place]. This information is not intended to replace advice given to you by your health care provider. Make sure you discuss any questions you have with your health care provider.

## 2015-11-27 NOTE — Assessment & Plan Note (Signed)
DM: Continue with metformin, ACEi. Checking a A1c. Has neuropathy, feet care discussed. HTN: Seems controlled on Lasix, lisinopril. Checking labs. Lower abdominal discomfort postprandially: Exam normal, no bruit, recommend observation for now H/o BCC-- due to see dermatology, declined at the referral RTC 6 months

## 2015-11-27 NOTE — Assessment & Plan Note (Addendum)
Td 2009;  pneumonia shot 04-2005 , 2013;   prevnar 2013;  zostavax 2011   Female care H/o routine PAPs for  years , never had an  abnormal ones, asx -- no further screening. Gyn visit if sx   MMG 08-2015 neg  DEXA 11-2006 , 12-2008 and 02-2013 -----> normal.  Per guidelines no need for a dexa this year, consider one in few years if still a candidate  Plan-- continue ca and vit D last colonoscopy 7-11,   Due for Cscope per GI letter, pros-cons discussed, likes to think about it   u/s 05-2011 neg for AAA Diet-discussed

## 2015-11-27 NOTE — Progress Notes (Signed)
Pre visit review using our clinic review tool, if applicable. No additional management support is needed unless otherwise documented below in the visit note. 

## 2016-03-02 ENCOUNTER — Other Ambulatory Visit: Payer: Self-pay | Admitting: Internal Medicine

## 2016-03-26 ENCOUNTER — Other Ambulatory Visit: Payer: Self-pay | Admitting: Internal Medicine

## 2016-05-11 ENCOUNTER — Other Ambulatory Visit: Payer: Self-pay | Admitting: Internal Medicine

## 2016-06-01 ENCOUNTER — Encounter: Payer: Self-pay | Admitting: Internal Medicine

## 2016-06-01 ENCOUNTER — Ambulatory Visit (INDEPENDENT_AMBULATORY_CARE_PROVIDER_SITE_OTHER): Payer: Medicare Other | Admitting: Internal Medicine

## 2016-06-01 VITALS — BP 128/80 | HR 83 | Temp 98.3°F | Resp 14 | Ht 66.0 in | Wt 189.5 lb

## 2016-06-01 DIAGNOSIS — I10 Essential (primary) hypertension: Secondary | ICD-10-CM

## 2016-06-01 DIAGNOSIS — Z889 Allergy status to unspecified drugs, medicaments and biological substances status: Secondary | ICD-10-CM

## 2016-06-01 DIAGNOSIS — T7840XA Allergy, unspecified, initial encounter: Secondary | ICD-10-CM | POA: Insufficient documentation

## 2016-06-01 DIAGNOSIS — E1140 Type 2 diabetes mellitus with diabetic neuropathy, unspecified: Secondary | ICD-10-CM

## 2016-06-01 LAB — BASIC METABOLIC PANEL
BUN: 15 mg/dL (ref 6–23)
CO2: 28 mEq/L (ref 19–32)
Calcium: 10.3 mg/dL (ref 8.4–10.5)
Chloride: 100 mEq/L (ref 96–112)
Creatinine, Ser: 0.77 mg/dL (ref 0.40–1.20)
GFR: 76.43 mL/min (ref >60.00)
Glucose, Bld: 84 mg/dL (ref 70–99)
Potassium: 4.6 mEq/L (ref 3.5–5.1)
Sodium: 135 mEq/L (ref 135–145)

## 2016-06-01 LAB — HEMOGLOBIN A1C: Hgb A1c MFr Bld: 6.4 % (ref 4.6–6.5)

## 2016-06-01 NOTE — Patient Instructions (Signed)
GO TO THE LAB : Get the blood work     GO TO THE FRONT DESK Schedule your next appointment for a  physical exam in 6 months  

## 2016-06-01 NOTE — Assessment & Plan Note (Signed)
DM: Encourage eye exam. Check A1c. Continue metformin. Currently CBGs in the 130s, according to the patient slightly more than usual. HTN: on lisinopril, Lasix, check a BMP Allergies- bronchospasm: Under Dr. Donneta Romberg, using Qvar, she does rinse after inhalers, despite that she still  has some coating and mouth lesions. Recommend to discuss with Dr Donneta Romberg RTC 6 moths . CPX

## 2016-06-01 NOTE — Progress Notes (Signed)
Subjective:    Patient ID: Natalie [name], female    DOB: 03-27-1933, 83 y.o.   MRN: [id number]  DOS:  06/01/2016 Type of visit - description : rov Interval history: HTN: Good medication compliance and ambulatory BPs DM: Good medication compliance, diet is okay, CBGs in the 130s Allergies, asthma: Having problems with Qvar, mouth lesions.   Review of Systems Denies chest pain or difficulty breathing No nausea, vomiting, diarrhea  Past Medical History:  Diagnosis Date  . Back pain, chronic   . BCC (basal cell carcinoma of skin)    several, nose , 2012 L forehead  . Degeneration of lumbar intervertebral disc    Dr. Nelva Bush  . Diabetes mellitus   . HTN (hypertension)   . Hyperlipidemia   . Hyperparathyroidism   . Lumbago    Lumbar epidural steroid injection at L5-S1 midline by Dr. Nelva Bush  . Pelvic pain in female 2005    back and pelvic pain, s/p w-u, CT (-), u/s showed a thick endometrium, s/p Bx per gyn  . Ureteric obstruction    hydronephrosis,sees urology    Past Surgical History:  Procedure Laterality Date  . CYSTECTOMY  1989   thyriod  . LOBECTOMY  1989   s/p  RML lung lobectomy, biopsy showed precancerous changes   . POLYPECTOMY     colon    Social History   Social History  . Marital status: Married    Spouse name: N/A  . Number of children: 4  . Years of education: N/A   Occupational History  . retired Advertising account executive   Social History Main Topics  . Smoking status: Never Smoker  . Smokeless tobacco: Never Used  . Alcohol use Yes     Comment: Rarely  . Drug use: No  . Sexual activity: Not on file   Other Topics Concern  . Not on file   Social History Narrative   Lives w/ husband                   Allergies as of 06/01/2016      Reactions   Cefixime    unknown   Cefprozil Nausea And Vomiting   Cefuroxime Axetil Nausea And Vomiting   Nitrofurantoin    unknown   Penicillins    unknown   Sulfonamide  Derivatives    unknown      Medication List       Accurate as of 06/01/16  9:20 PM. Always use your most recent med list.          acetaminophen 500 MG tablet Commonly known as:  TYLENOL Take 1,250 mg by mouth every 6 (six) hours as needed for mild pain.   albuterol 108 (90 Base) MCG/ACT inhaler Commonly known as:  PROVENTIL HFA;VENTOLIN HFA Inhale 2 puffs into the lungs every 6 (six) hours as needed for wheezing.   aspirin 81 MG tablet Take 81 mg by mouth daily. Reported on 05/30/2015   beclomethasone 40 MCG/ACT inhaler Commonly known as:  QVAR Inhale 1-2 puffs into the lungs 2 (two) times daily.   BIOTIN PO Take by mouth.   calcium carbonate 500 MG chewable tablet Commonly known as:  TUMS - dosed in mg elemental calcium Chew 1 tablet by mouth daily.   CINNAMON PO Take by mouth daily. Reported on 05/30/2015   EPIPEN 2-PAK 0.3 mg/0.3 mL Soaj injection Generic drug:  EPINEPHrine Reported on 11/27/2015   famotidine 20 MG tablet Commonly known as:  PEPCID Take 20 mg by mouth 2 (two) times daily as needed.   fluticasone 50 MCG/ACT nasal spray Commonly known as:  FLONASE Place 2 sprays into the nose daily.   furosemide 20 MG tablet Commonly known as:  LASIX Take 30 mg by mouth.   glucose blood test strip Commonly known as:  ONE TOUCH ULTRA TEST Check blood sugar once daily as directed.   HYDROcodone-acetaminophen 10-325 MG tablet Commonly known as:  NORCO Take 1 tablet by mouth every 8 (eight) hours as needed.   lisinopril 20 MG tablet Commonly known as:  PRINIVIL,ZESTRIL Take 1 tablet (20 mg total) by mouth daily.   loratadine 10 MG tablet Commonly known as:  CLARITIN Take 10 mg by mouth daily.   MAALOX ANTI-GAS PO Take by mouth as needed. Any anti-gas medication[tablet as needed].   metFORMIN 500 MG tablet Commonly known as:  GLUCOPHAGE Take 1.5 tablets (750 mg total) by mouth 2 (two) times daily with a meal.   multivitamin,tx-minerals tablet Take  1 tablet by mouth daily.   naproxen sodium 220 MG tablet Commonly known as:  ANAPROX Take 220 mg by mouth daily.   NON FORMULARY ALLERGY INJECTIONS.   ONETOUCH DELICA LANCETS FINE Misc use to TEST ONCE DAILY   tetrahydrozoline 0.05 % ophthalmic solution Place 2 drops into both eyes daily.          Objective:   Physical Exam BP 128/80 (BP Location: Left Arm, Patient Position: Sitting, Cuff Size: Normal)   Pulse 83   Temp 98.3 F (36.8 C) (Oral)   Resp 14   Ht '5\' 6"'$  (1.676 m)   Wt 189 lb 8 oz (86 kg)   SpO2 96%   BMI 30.59 kg/m  General:   Well developed, well nourished . NAD.  HEENT:  Normocephalic . Face symmetric, atraumatic Lungs:  CTA B Normal respiratory effort, no intercostal retractions, no accessory muscle use. Heart: RRR,  no murmur.  No pretibial edema bilaterally  Skin: Not pale. Not jaundice Neurologic:  alert & oriented X3.  Speech normal, gait appropriate for age and unassisted Psych--  Cognition and judgment appear intact.  Cooperative with normal attention span and concentration.  Behavior appropriate. No anxious or depressed appearing.      Assessment & Plan:   Assessment DM, complicated by neuropathy and callus HTN Hyperlipidemia, intolerant to medications  Allergies, B-spasm, get shots----- see Dr Donneta Romberg  Fatigue, chronic GU: Hydronephrosis , used to see  urology MSK: --DJD --Chronic back pain, dr Nelva Bush , he rx hydrocodone BCC H/o pelvic pain, 2005, w/u per gyn H/o hyperparathyroidism H/o RML Lobectomy, 1989, bx precancerous cells  PLAN DM: Encourage eye exam. Check A1c. Continue metformin. Currently CBGs in the 130s, according to the patient slightly more than usual. HTN: on lisinopril, Lasix, check a BMP Allergies- bronchospasm: Under Dr. Donneta Romberg, using Qvar, she does rinse after inhalers, despite that she still  has some coating and mouth lesions. Recommend to discuss with Dr Donneta Romberg RTC 6 moths . CPX

## 2016-06-01 NOTE — Progress Notes (Signed)
Pre visit review using our clinic review tool, if applicable. No additional management support is needed unless otherwise documented below in the visit note. 

## 2016-06-09 ENCOUNTER — Telehealth: Payer: Self-pay | Admitting: Internal Medicine

## 2016-06-09 NOTE — Telephone Encounter (Signed)
Relation to [id number] Call back number:[phone]   Reason for call:  Patient states she never received letter and mail and would like call regarding lab results please advise

## 2016-06-09 NOTE — Telephone Encounter (Signed)
Pt called back in. She says that she just checked that mail and she did receive her labs.  No call back is needed.

## 2016-06-30 ENCOUNTER — Telehealth: Payer: Self-pay | Admitting: *Deleted

## 2016-06-30 NOTE — Telephone Encounter (Signed)
LM for patient to return call to schedule AWV.   

## 2016-07-13 ENCOUNTER — Ambulatory Visit (INDEPENDENT_AMBULATORY_CARE_PROVIDER_SITE_OTHER): Payer: Medicare Other | Admitting: Internal Medicine

## 2016-07-13 ENCOUNTER — Encounter: Payer: Self-pay | Admitting: Internal Medicine

## 2016-07-13 ENCOUNTER — Ambulatory Visit (HOSPITAL_BASED_OUTPATIENT_CLINIC_OR_DEPARTMENT_OTHER)
Admission: RE | Admit: 2016-07-13 | Discharge: 2016-07-13 | Disposition: A | Discharge status: Home / Self Care | Payer: Medicare Other | Source: Ambulatory Visit | Attending: Internal Medicine | Admitting: Internal Medicine

## 2016-07-13 VITALS — BP 132/84 | HR 94 | Temp 98.1°F | Resp 14 | Ht 66.0 in | Wt 182.4 lb

## 2016-07-13 DIAGNOSIS — R059 Cough, unspecified: Secondary | ICD-10-CM

## 2016-07-13 DIAGNOSIS — Z9889 Other specified postprocedural states: Secondary | ICD-10-CM | POA: Insufficient documentation

## 2016-07-13 DIAGNOSIS — R05 Cough: Secondary | ICD-10-CM

## 2016-07-13 DIAGNOSIS — I700 Atherosclerosis of aorta: Secondary | ICD-10-CM | POA: Insufficient documentation

## 2016-07-13 DIAGNOSIS — J4531 Mild persistent asthma with (acute) exacerbation: Secondary | ICD-10-CM

## 2016-07-13 NOTE — Patient Instructions (Signed)
Get your x-ray at the first floor  Rest, fluids , tylenol  For cough:  Take Mucinex DM twice a day as needed until better   Continue taking  All asthma and  allergy medications  Finish antibiotic Dr. Donneta Romberg provided  Call if not gradually better over the next  10 days  Call anytime if the symptoms are severe

## 2016-07-13 NOTE — Progress Notes (Signed)
Subjective:    Patient ID: Natalie [name], female    DOB: [birth year], 83 y.o.   MRN: [id number]  DOS:  07/13/2016 Type of visit - description : acute Interval history: Got respiratory illness 2 weeks ago, moderate to severe, went to see Dr. Donneta Romberg: Had a breathing treatment, a steroid IM shot, steroids by mouth, a Z-Pak. Then  he called in a second round of antibiotics. Is here today because although better she still feels is not 100% back back to normal. Had fever at the beginning of her illness  but no recently Still has some chills and feeling clammy Cough has decrease. Wheezing much better. No difficulty breathing, initially had chest pain with cough that has improved. Sinuses were congested but that is better    Review of Systems See above  Past Medical History:  Diagnosis Date  . Back pain, chronic   . BCC (basal cell carcinoma of skin)    several, nose , 2012 L forehead  . Degeneration of lumbar intervertebral disc    Dr. Nelva Bush  . Diabetes mellitus   . HTN (hypertension)   . Hyperlipidemia   . Hyperparathyroidism   . Lumbago    Lumbar epidural steroid injection at L5-S1 midline by Dr. Nelva Bush  . Pelvic pain in female 2005    back and pelvic pain, s/p w-u, CT (-), u/s showed a thick endometrium, s/p Bx per gyn  . Ureteric obstruction    hydronephrosis,sees urology    Past Surgical History:  Procedure Laterality Date  . CYSTECTOMY  1989   thyriod  . LOBECTOMY  1989   s/p  RML lung lobectomy, biopsy showed precancerous changes   . POLYPECTOMY     colon    Social History   Social History  . Marital status: Married    Spouse name: N/A  . Number of children: 4  . Years of education: N/A   Occupational History  . retired Advertising account executive   Social History Main Topics  . Smoking status: Never Smoker  . Smokeless tobacco: Never Used  . Alcohol use Yes     Comment: Rarely  . Drug use: No  . Sexual activity: Not on file    Other Topics Concern  . Not on file   Social History Narrative   Lives w/ husband                   Allergies as of 07/13/2016      Reactions   Cefixime    unknown   Cefprozil Nausea And Vomiting   Cefuroxime Axetil Nausea And Vomiting   Nitrofurantoin    unknown   Penicillins    unknown   Sulfonamide Derivatives    unknown      Medication List       Accurate as of 07/13/16 11:59 PM. Always use your most recent med list.          acetaminophen 500 MG tablet Commonly known as:  TYLENOL Take 1,250 mg by mouth every 6 (six) hours as needed for mild pain.   albuterol 108 (90 Base) MCG/ACT inhaler Commonly known as:  PROVENTIL HFA;VENTOLIN HFA Inhale 2 puffs into the lungs every 6 (six) hours as needed for wheezing.   aspirin 81 MG tablet Take 81 mg by mouth daily. Reported on 05/30/2015   BIOTIN PO Take by mouth.   calcium carbonate 500 MG chewable tablet Commonly known as:  TUMS - dosed in mg elemental calcium Chew  1 tablet by mouth daily.   CINNAMON PO Take by mouth daily. Reported on 05/30/2015   EPIPEN 2-PAK 0.3 mg/0.3 mL Soaj injection Generic drug:  EPINEPHrine Reported on 11/27/2015   famotidine 20 MG tablet Commonly known as:  PEPCID Take 20 mg by mouth 2 (two) times daily as needed.   fluticasone 110 MCG/ACT inhaler Commonly known as:  FLOVENT HFA Inhale 1-2 puffs into the lungs 2 (two) times daily.   fluticasone 50 MCG/ACT nasal spray Commonly known as:  FLONASE Place 2 sprays into the nose daily.   furosemide 20 MG tablet Commonly known as:  LASIX Take 30 mg by mouth.   glucose blood test strip Commonly known as:  ONE TOUCH ULTRA TEST Check blood sugar once daily as directed.   HYDROcodone-acetaminophen 10-325 MG tablet Commonly known as:  NORCO Take 1 tablet by mouth every 8 (eight) hours as needed.   lisinopril 20 MG tablet Commonly known as:  PRINIVIL,ZESTRIL Take 1 tablet (20 mg total) by mouth daily.   loratadine 10 MG  tablet Commonly known as:  CLARITIN Take 10 mg by mouth daily.   MAALOX ANTI-GAS PO Take by mouth as needed. Any anti-gas medication[tablet as needed].   metFORMIN 500 MG tablet Commonly known as:  GLUCOPHAGE Take 1.5 tablets (750 mg total) by mouth 2 (two) times daily with a meal.   multivitamin,tx-minerals tablet Take 1 tablet by mouth daily.   naproxen sodium 220 MG tablet Commonly known as:  ANAPROX Take 220 mg by mouth daily.   NON FORMULARY ALLERGY INJECTIONS.   ONETOUCH DELICA LANCETS FINE Misc use to TEST ONCE DAILY   tetrahydrozoline 0.05 % ophthalmic solution Place 2 drops into both eyes daily.          Objective:   Physical Exam BP 132/84 (BP Location: Left Arm, Patient Position: Sitting, Cuff Size: Normal)   Pulse 94   Temp 98.1 F (36.7 C) (Oral)   Resp 14   Ht '5\' 6"'$  (1.676 m)   Wt 182 lb 6 oz (82.7 kg)   SpO2 96%   BMI 29.44 kg/m  General:   Well developed, well nourished . NAD.  HEENT:  Normocephalic . Face symmetric, atraumatic . TMs symmetric. Nose congested, sinuses no TTP. Throat without redness or discharge Lungs:  CTA B Normal respiratory effort, no intercostal retractions, no accessory muscle use. Heart: RRR,  no murmur.  No pretibial edema bilaterally  Skin: Not pale. Not jaundice Neurologic:  alert & oriented X3.  Speech normal, gait appropriate for age and unassisted Psych--  Cognition and judgment appear intact.  Cooperative with normal attention span and concentration.  Behavior appropriate. No anxious or depressed appearing.      Assessment & Plan:   Assessment DM, complicated by neuropathy and callus HTN Hyperlipidemia, intolerant to medications  Allergies, B-spasm, get shots----- see Dr Donneta Romberg  Fatigue, chronic GU: Hydronephrosis , used to see  urology [facility]: --DJD --Chronic back pain, dr Nelva Bush , he rx hydrocodone BCC H/o pelvic pain, 2005, w/u per gyn H/o hyperparathyroidism H/o RML Lobectomy, 1989, bx  precancerous cells  PLAN Asthma exacerbation: Overall feeling better, had steroids IM and by mouth, S/P Z-Pak and finishing a second round of antibiotics per her allergist. Pt is concerned b/c she felt clammy and is not completely well. Exam is benign. Patient request a chest x-ray. Plan: Finish antibiotic as rx, continue routine med for asthma, drink plenty of fluids, Tylenol , call if not back to normal in few days  Will check a chest x-ray.

## 2016-07-13 NOTE — Progress Notes (Signed)
Pre visit review using our clinic review tool, if applicable. No additional management support is needed unless otherwise documented below in the visit note. 

## 2016-07-14 NOTE — Assessment & Plan Note (Signed)
Asthma exacerbation: Overall feeling better, had steroids IM and by mouth, S/P Z-Pak and finishing a second round of antibiotics per her allergist. Pt is concerned b/c she felt clammy and is not completely well. Exam is benign. Patient request a chest x-ray. Plan: Finish antibiotic as rx, continue routine med for asthma, drink plenty of fluids, Tylenol , call if not back to normal in few days  Will check a chest x-ray.

## 2016-08-18 ENCOUNTER — Other Ambulatory Visit: Payer: Self-pay | Admitting: Internal Medicine

## 2016-08-23 ENCOUNTER — Telehealth: Payer: Self-pay | Admitting: Internal Medicine

## 2016-08-23 NOTE — Telephone Encounter (Signed)
Left pt message asking to call Allison back directly at [phone] to schedule AWV. Thanks! °

## 2016-09-03 ENCOUNTER — Encounter: Payer: Self-pay | Admitting: Sports Medicine

## 2016-09-03 ENCOUNTER — Ambulatory Visit (INDEPENDENT_AMBULATORY_CARE_PROVIDER_SITE_OTHER): Payer: Medicare Other | Admitting: Sports Medicine

## 2016-09-03 DIAGNOSIS — G8929 Other chronic pain: Secondary | ICD-10-CM | POA: Diagnosis not present

## 2016-09-03 DIAGNOSIS — M25512 Pain in left shoulder: Secondary | ICD-10-CM

## 2016-09-03 NOTE — Patient Instructions (Signed)
Please perform the exercise program that Natalie [name] has prepared for you and gone over in detail on a daily basis.  In addition to the handout you were provided you can access your program through: [email or web address]   Your unique program code is: [email or web address]

## 2016-09-03 NOTE — Progress Notes (Addendum)
OFFICE VISIT NOTE Natalie [name], [place] at [facility] [phone]  Natalie [name] - 83 y.o. female MRN [id number]  Date of birth: Jun 08, 1932  Visit Date: 09/03/2016  PCP: Colon Branch, MD   Referred by: Colon Branch, MD  Burlene Arnt, CMA acting as scribe for Dr. Paulla Fore.  SUBJECTIVE:   Chief Complaint  Patient presents with  . pain in left shoulder   Pain in left shoulder.  Pain started about 2 weeks ago Pain started when reaching for the covers and then was irritated again when reaching down.   The pain is described as shooting pain and is rated as 6/10.  Worsened with raising arm, lifting, and moving arm across body Improves with resting the arm at her side Therapies tried include hydrocodone (prescribed for back pain), some relief. She has tried Aleve and Tylenol.   Other associated symptoms include: swelling, radiating pain into arm    Review of Systems  Constitutional: Negative for chills and fever.  Musculoskeletal: Positive for joint pain.  Neurological: Positive for headaches. Negative for tingling.    Otherwise per HPI.  HISTORY & PERTINENT PRIOR DATA:  No specialty comments available. She reports that she has never smoked. She has never used smokeless tobacco.   Recent Labs  11/27/15 1029 06/01/16 1101  HGBA1C 6.1 6.4   Medications & Allergies reviewed per EMR Patient Active Problem List   Diagnosis Date Noted  . Chronic left shoulder pain 10/05/2016  . Multiple allergies 06/01/2016  . PCP NOTES >>>>>>>>>>>>>>>>>>> 11/27/2015  . Fatigue 03/19/2014  . Vertigo 09/19/2013  . Preop examination 02/05/2013  . Annual physical exam 02/05/2011  . DM (diabetes mellitus) type II controlled, neurological manifestation ([place]) 05/01/2008  . Dyslipidemia --intolerant to medications 05/01/2008  . Back pain, chronic-sees Dr.Ramos 10/11/2007  . HTN (hypertension) 11/30/2006  . S/P lobectomy of lung 11/30/2006  .  OBSTRUCTION, URETERIC NEC 11/30/2006   Past Medical History:  Diagnosis Date  . Back pain, chronic   . BCC (basal cell carcinoma of skin)    several, nose , 2012 L forehead  . Degeneration of lumbar intervertebral disc    Dr. Nelva Bush  . Diabetes mellitus   . HTN (hypertension)   . Hyperlipidemia   . Hyperparathyroidism   . Lumbago    Lumbar epidural steroid injection at L5-S1 midline by Dr. Nelva Bush  . Pelvic pain in female 2005    back and pelvic pain, s/p w-u, CT (-), u/s showed a thick endometrium, s/p Bx per gyn  . Ureteric obstruction    hydronephrosis,sees urology   Family History  Problem Relation Age of Onset  . Diabetes Brother        X 2  . Hypertension Brother   . Hypertension Mother   . Brain cancer Mother        tumor  . CAD Father        MI at 34  . Breast cancer Neg Hx   . Colon cancer Neg Hx    Past Surgical History:  Procedure Laterality Date  . CYSTECTOMY  1989   thyriod  . LOBECTOMY  1989   s/p  RML lung lobectomy, biopsy showed precancerous changes   . POLYPECTOMY     colon   Social History   Occupational History  . retired Advertising account executive   Social History Main Topics  . Smoking status: Never Smoker  . Smokeless tobacco: Never Used  .  Alcohol use Yes     Comment: Rarely  . Drug use: No  . Sexual activity: Not on file    OBJECTIVE:  VS:  HT:5' 6" (167.6 cm)   WT:183 lb 6.4 oz (83.2 kg)  BMI:29.7    BP:(!) 142/80  HR:88bpm  TEMP: ( )  RESP:96 % Physical Exam Findings:  WDWN, NAD, Non-toxic appearing Alert & appropriately interactive Not depressed or anxious appearing No increased work of breathing. Pupils are equal. EOM intact without nystagmus No clubbing or cyanosis of the extremities appreciated No significant rashes/lesions/ulcerations overlying the examined area. Radial pulses 2+/4.  No significant generalized UE edema. Sensation intact to light touch in upper extremities.  Left Shoulder:  Normal  alignment. No significant deformity.  Non tender over clavical, AC Joint, Anterior Shoulder. Full overhead range of motion, symmetric Prior to ER, empty can testing strength is 5 out of 5.  Some pain with empty can. Speeds testing, O'Brien's test, Hawkins, Neers all painful     ASSESSMENT & PLAN:   Problem List Items Addressed This Visit    Chronic left shoulder pain    Symptoms are consistent with left shoulder impingement. Therapeutic exercises reviewed in detail with James Jefferson, ATC focusing on rotator cuff strengthening as well as scapular stabilization. Injection performed today. If any lack of improvement consider further diagnostic evaluation with [facility] ultrasound.  +++++++++++++++++++++++++++++++++++++++++++++++++++++++++++++++ PROCEDURE NOTE: THERAPEUTIC EXERCISES (97110) 15 minutes spent for Therapeutic exercises as stated in above notes.  This included exercises focusing on stretching, strengthening, with significant focus on eccentric aspects.   Proper technique shown and discussed handout in great detail with ATC.  All questions were discussed and answered.   +++++++++++++++++++++++++++++++++++++++++++++++++++++++++++++++++++++++++++++ PROCEDURE NOTE: Left subacrominal Injection   DESCRIPTION OF PROCEDURE:  The patient's clinical condition is marked by substantial pain and/or significant functional disability. Other conservative therapy has not provided relief, is contraindicated, or not appropriate. There is a reasonable likelihood that injection will significantly improve the patient's pain and/or functional impairment. After discussing the risks, benefits and expected outcomes of the injection and all questions were reviewed and answered, the patient wished to undergo the above named procedure. Verbal consent was obtained. The target structure was injected under direct visualization using sterile technique as below: PREP: Alcohol, Ethel Chloride APPROACH: Posterior,  22g 1.5" needle INJECTATE: 2 cc 0.5% marcaine, 1cc 40mg DepoMedrol DRESSING: Band-Aid  Post procedural instructions including recommending icing and warning signs for infection were reviewed. This procedure was well tolerated and there were no complications.             Follow-up: Return in about 6 weeks (around 10/15/2016).   CMA/ATC served as scribe during this visit. History, Physical, and Plan performed by medical provider. Documentation and orders reviewed and attested to.       , DO    [facility]  

## 2016-10-05 DIAGNOSIS — M25512 Pain in left shoulder: Principal | ICD-10-CM

## 2016-10-05 DIAGNOSIS — G8929 Other chronic pain: Secondary | ICD-10-CM | POA: Insufficient documentation

## 2016-10-05 NOTE — Assessment & Plan Note (Addendum)
Symptoms are consistent with left shoulder impingement. Therapeutic exercises reviewed in detail with Kristine Royal, ATC focusing on rotator cuff strengthening as well as scapular stabilization. Injection performed today. If any lack of improvement consider further diagnostic evaluation with MSK ultrasound.  +++++++++++++++++++++++++++++++++++++++++++++++++++++++++++++++ PROCEDURE NOTE: THERAPEUTIC EXERCISES (97110) 15 minutes spent for Therapeutic exercises as stated in above notes.  This included exercises focusing on stretching, strengthening, with significant focus on eccentric aspects.   Proper technique shown and discussed handout in great detail with ATC.  All questions were discussed and answered.   +++++++++++++++++++++++++++++++++++++++++++++++++++++++++++++++++++++++++++++ PROCEDURE NOTE: Left subacrominal Injection   DESCRIPTION OF PROCEDURE:  The patient's clinical condition is marked by substantial pain and/or significant functional disability. Other conservative therapy has not provided relief, is contraindicated, or not appropriate. There is a reasonable likelihood that injection will significantly improve the patient's pain and/or functional impairment. After discussing the risks, benefits and expected outcomes of the injection and all questions were reviewed and answered, the patient wished to undergo the above named procedure. Verbal consent was obtained. The target structure was injected under direct visualization using sterile technique as below: PREP: Alcohol, Ethel Chloride APPROACH: Posterior, 22g 1.5" needle INJECTATE: 2 cc 0.5% marcaine, 1cc 40mg  DepoMedrol DRESSING: Band-Aid  Post procedural instructions including recommending icing and warning signs for infection were reviewed. This procedure was well tolerated and there were no complications.

## 2016-10-13 ENCOUNTER — Ambulatory Visit: Payer: Medicare Other | Admitting: Sports Medicine

## 2016-10-13 NOTE — Telephone Encounter (Signed)
LVM asking pt to call back to see if we can add on AWV at 1130 to her appt already scheduled on 6/1 at 11am with Dr. Larose Kells. Blocked slot on Angel's schedule just in case.

## 2016-10-14 ENCOUNTER — Ambulatory Visit: Payer: Medicare Other | Admitting: Internal Medicine

## 2016-10-15 ENCOUNTER — Ambulatory Visit (INDEPENDENT_AMBULATORY_CARE_PROVIDER_SITE_OTHER): Payer: Medicare Other | Admitting: Internal Medicine

## 2016-10-15 ENCOUNTER — Encounter: Payer: Self-pay | Admitting: Internal Medicine

## 2016-10-15 VITALS — BP 128/78 | HR 76 | Temp 98.1°F | Resp 14 | Ht 66.0 in | Wt 184.2 lb

## 2016-10-15 DIAGNOSIS — J019 Acute sinusitis, unspecified: Secondary | ICD-10-CM

## 2016-10-15 DIAGNOSIS — N390 Urinary tract infection, site not specified: Secondary | ICD-10-CM

## 2016-10-15 DIAGNOSIS — I10 Essential (primary) hypertension: Secondary | ICD-10-CM | POA: Diagnosis not present

## 2016-10-15 DIAGNOSIS — R399 Unspecified symptoms and signs involving the genitourinary system: Secondary | ICD-10-CM | POA: Diagnosis not present

## 2016-10-15 LAB — URINALYSIS, ROUTINE W REFLEX MICROSCOPIC
Bilirubin Urine: NEGATIVE
Hgb urine dipstick: NEGATIVE
Ketones, ur: NEGATIVE
Leukocytes, UA: NEGATIVE
Nitrite: POSITIVE — AB
RBC / HPF: NONE SEEN (ref 0–?)
Specific Gravity, Urine: 1.015 (ref 1.000–1.030)
Total Protein, Urine: NEGATIVE
Urine Glucose: NEGATIVE
Urobilinogen, UA: 0.2 (ref 0.0–1.0)
pH: 6 (ref 5.0–8.0)

## 2016-10-15 LAB — POC URINALSYSI DIPSTICK (AUTOMATED)
Bilirubin, UA: NEGATIVE
Blood, UA: NEGATIVE
Glucose, UA: NEGATIVE
Ketones, UA: NEGATIVE
Leukocytes, UA: NEGATIVE
Nitrite, UA: POSITIVE
Protein, UA: NEGATIVE
Spec Grav, UA: 1.025 (ref 1.010–1.025)
Urobilinogen, UA: 0.2 E.U./dL
pH, UA: 6 (ref 5.0–8.0)

## 2016-10-15 LAB — BASIC METABOLIC PANEL
BUN: 15 mg/dL (ref 6–23)
CO2: 26 mEq/L (ref 19–32)
Calcium: 10 mg/dL (ref 8.4–10.5)
Chloride: 102 mEq/L (ref 96–112)
Creatinine, Ser: 0.76 mg/dL (ref 0.40–1.20)
GFR: 77.52 mL/min (ref >60.00)
Glucose, Bld: 83 mg/dL (ref 70–99)
Potassium: 4.2 mEq/L (ref 3.5–5.1)
Sodium: 136 mEq/L (ref 135–145)

## 2016-10-15 MED ORDER — LEVOFLOXACIN 500 MG PO TABS: 500.0000 mg | ORAL_TABLET | Freq: Every day | ORAL | 0 refills | Status: DC

## 2016-10-15 NOTE — Progress Notes (Signed)
Subjective:    Patient ID: Natalie [name], female    DOB: [birth year], 83 y.o.   MRN: [id number]  DOS:  10/15/2016 Type of visit - description : Acute visit, multiple complaints Interval history: Not feeling well for 2 weeks: Postvoid dysuria but denies gross hematuria, nausea or vomiting. She does have some suprapubic discomfort.  Also, facial pain, mostly at the right maxillary area, some on the left and bilateral forehead; pain described as congestion   Review of Systems No fever or chills. Mild amount of clear nasal discharge. Some right ear pain but no discharge, mild decreased hearing. Asthma is not exacerbated  Past Medical History:  Diagnosis Date  . Back pain, chronic   . BCC (basal cell carcinoma of skin)    several, nose , 2012 L forehead  . Degeneration of lumbar intervertebral disc    Dr. Nelva Bush  . Diabetes mellitus   . HTN (hypertension)   . Hyperlipidemia   . Hyperparathyroidism   . Lumbago    Lumbar epidural steroid injection at L5-S1 midline by Dr. Nelva Bush  . Pelvic pain in female 2005    back and pelvic pain, s/p w-u, CT (-), u/s showed a thick endometrium, s/p Bx per gyn  . Ureteric obstruction    hydronephrosis,sees urology    Past Surgical History:  Procedure Laterality Date  . CYSTECTOMY  1989   thyriod  . LOBECTOMY  1989   s/p  RML lung lobectomy, biopsy showed precancerous changes   . POLYPECTOMY     colon    Social History   Social History  . Marital status: Married    Spouse name: N/A  . Number of children: 4  . Years of education: N/A   Occupational History  . retired Advertising account executive   Social History Main Topics  . Smoking status: Never Smoker  . Smokeless tobacco: Never Used  . Alcohol use Yes     Comment: Rarely  . Drug use: No  . Sexual activity: Not on file   Other Topics Concern  . Not on file   Social History Narrative   Lives w/ husband                   Allergies as of 10/15/2016        Reactions   Cefixime    unknown   Cefprozil Nausea And Vomiting   Cefuroxime Axetil Nausea And Vomiting   Nitrofurantoin    unknown   Penicillins    unknown   Sulfonamide Derivatives    unknown      Medication List       Accurate as of 10/15/16 11:59 PM. Always use your most recent med list.          acetaminophen 500 MG tablet Commonly known as:  TYLENOL Take 1,250 mg by mouth every 6 (six) hours as needed for mild pain.   albuterol 108 (90 Base) MCG/ACT inhaler Commonly known as:  PROVENTIL HFA;VENTOLIN HFA Inhale 2 puffs into the lungs every 6 (six) hours as needed for wheezing.   aspirin 81 MG tablet Take 81 mg by mouth daily. Reported on 05/30/2015   BIOTIN PO Take by mouth.   calcium carbonate 500 MG chewable tablet Commonly known as:  TUMS - dosed in mg elemental calcium Chew 1 tablet by mouth daily.   CINNAMON PO Take by mouth daily. Reported on 05/30/2015   EPIPEN 2-PAK 0.3 mg/0.3 mL Soaj injection Generic drug:  EPINEPHrine Reported  on 11/27/2015   famotidine 20 MG tablet Commonly known as:  PEPCID Take 20 mg by mouth 2 (two) times daily as needed.   fluticasone 110 MCG/ACT inhaler Commonly known as:  FLOVENT HFA Inhale 1-2 puffs into the lungs 2 (two) times daily.   fluticasone 50 MCG/ACT nasal spray Commonly known as:  FLONASE Place 2 sprays into the nose daily.   furosemide 20 MG tablet Commonly known as:  LASIX Take 1.5 tablets (30 mg total) by mouth daily.   glucose blood test strip Commonly known as:  ONE TOUCH ULTRA TEST Check blood sugar once daily as directed.   HYDROcodone-acetaminophen 10-325 MG tablet Commonly known as:  NORCO Take 1 tablet by mouth every 8 (eight) hours as needed.   levofloxacin 500 MG tablet Commonly known as:  LEVAQUIN Take 1 tablet (500 mg total) by mouth daily.   lisinopril 20 MG tablet Commonly known as:  PRINIVIL,ZESTRIL Take 1 tablet (20 mg total) by mouth daily.   loratadine 10 MG  tablet Commonly known as:  CLARITIN Take 10 mg by mouth daily.   MAALOX ANTI-GAS PO Take by mouth as needed. Any anti-gas medication[tablet as needed].   metFORMIN 500 MG tablet Commonly known as:  GLUCOPHAGE Take 1.5 tablets (750 mg total) by mouth 2 (two) times daily with a meal.   multivitamin,tx-minerals tablet Take 1 tablet by mouth daily.   naproxen sodium 220 MG tablet Commonly known as:  ANAPROX Take 220 mg by mouth daily.   NON FORMULARY ALLERGY INJECTIONS.   ONETOUCH DELICA LANCETS FINE Misc use to TEST ONCE DAILY   tetrahydrozoline 0.05 % ophthalmic solution Place 2 drops into both eyes daily.          Objective:   Physical Exam BP 128/78 (BP Location: Left Arm, Patient Position: Sitting, Cuff Size: Normal)   Pulse 76   Temp 98.1 F (36.7 C) (Oral)   Resp 14   Ht '5\' 6"'$  (1.676 m)   Wt 184 lb 4 oz (83.6 kg)   SpO2 96%   BMI 29.74 kg/m  General:   Well developed, well nourished . NAD.  HEENT:  Normocephalic . Face symmetric, atraumatic. TMs not red, no discharge. Throat: Symmetric Face: Mildly to moderately TTP at the right maxillary area, left normal. Nose is slightly congested Lungs:  CTA B Normal respiratory effort, no intercostal retractions, no accessory muscle use. Heart: RRR,  no murmur.  No pretibial edema bilaterally  Abdomen: Nontender Skin: Not pale. Not jaundice Neurologic:  alert & oriented X3.  Speech normal, gait appropriate for age and unassisted Psych--  Cognition and judgment appear intact.  Cooperative with normal attention span and concentration.  Behavior appropriate. No anxious or depressed appearing.      Assessment & Plan:   Assessment DM, complicated by neuropathy and callus HTN Hyperlipidemia, intolerant to medications  Allergies, B-spasm, get shots----- see Dr Donneta Romberg  Fatigue, chronic GU: Hydronephrosis , used to see  urology [facility]: --DJD --Chronic back pain, dr Nelva Bush , he rx hydrocodone BCC H/o pelvic  pain, 2005, w/u per gyn H/o hyperparathyroidism H/o RML Lobectomy, 1989, bx precancerous cells  PLAN Maxillary sinusitis and question of UTI, patient is diabetic. Has sx of both sinusitis and UTI.   Udip  + Nitratres. She needs ABX, multiple abx allergies, rx Levaquin, it can treat both infections. We are checking a BMP (addendum renal fx wnl). Otherwise supportive treatment. See instructions HTN: BMP

## 2016-10-15 NOTE — Progress Notes (Signed)
Pre visit review using our clinic review tool, if applicable. No additional management support is needed unless otherwise documented below in the visit note. 

## 2016-10-15 NOTE — Patient Instructions (Signed)
Go to the lab before you leave  Rest, fluids , tylenol  For cough:  Take Mucinex DM twice a day as needed until better  For nasal congestion: Use OTC Nasocort or Flonase : 2 nasal sprays on each side of the nose in the morning until you feel better   Avoid decongestants such as  Pseudoephedrine or phenylephrine    Take the antibiotic as prescribed: Levaquin for 5 days  Call if not gradually better over the next  10 days  Call anytime if the symptoms are severe

## 2016-10-16 NOTE — Assessment & Plan Note (Signed)
Maxillary sinusitis and question of UTI, patient is diabetic. Has sx of both sinusitis and UTI.   Udip  + Nitratres. She needs ABX, multiple abx allergies, rx Levaquin, it can treat both infections. We are checking a BMP (addendum renal fx wnl). Otherwise supportive treatment. See instructions HTN: BMP

## 2016-10-17 LAB — URINE CULTURE

## 2016-10-20 ENCOUNTER — Telehealth: Payer: Self-pay | Admitting: Internal Medicine

## 2016-10-20 NOTE — Telephone Encounter (Signed)
Will need appt if sx's persist.

## 2016-10-20 NOTE — Telephone Encounter (Signed)
Caller name: Relationship to patient: Self Can be reached: [phone]  Pharmacy:  Reason for call: States she does not think her UTI has cleared up

## 2016-10-21 NOTE — Telephone Encounter (Signed)
Spoke with patient. She states she has other appointments for Friday and Monday so she will call next week to schedule with provider if she is still having sx.

## 2016-10-21 NOTE — Telephone Encounter (Signed)
Noted  

## 2016-10-25 ENCOUNTER — Ambulatory Visit: Payer: Medicare Other | Admitting: Sports Medicine

## 2016-10-29 ENCOUNTER — Ambulatory Visit (INDEPENDENT_AMBULATORY_CARE_PROVIDER_SITE_OTHER): Payer: Medicare Other | Admitting: Sports Medicine

## 2016-10-29 ENCOUNTER — Encounter: Payer: Self-pay | Admitting: Sports Medicine

## 2016-10-29 VITALS — BP 150/80 | HR 74 | Ht 66.0 in | Wt 186.6 lb

## 2016-10-29 DIAGNOSIS — G8929 Other chronic pain: Secondary | ICD-10-CM | POA: Diagnosis not present

## 2016-10-29 DIAGNOSIS — M25512 Pain in left shoulder: Secondary | ICD-10-CM

## 2016-10-29 NOTE — Progress Notes (Signed)
OFFICE VISIT NOTE Natalie [name]. Natalie [name], [place] at [facility] [phone]  Natalie [name] - 83 y.o. female MRN [id number]  Date of birth: 03-12-1933  Visit Date: 10/29/2016  PCP: Colon Branch, MD   Referred by: Colon Branch, MD  Autumn McNeil,cma acting as scribe for Dr. Paulla Fore.  SUBJECTIVE:   Chief Complaint  Patient presents with  . Follow-up  . Left Shoulder Pain   HPI: As below and per problem based documentation when appropriate.  Natalie [name] presents as a follow up for Pain in left shoulder.  Steroid injection administer at last visit with a specific set of exercises for her to perform at home. Tried exercises for the first week only--did notice improvement. Overall shoulder does feel better. No pain at rest. Specific movements will trigger a dull pain associated with upper arm soreness. Swelling has resolved.       Review of Systems  Constitutional: Negative for chills, diaphoresis, fever, malaise/fatigue and weight loss.  HENT: Negative.   Eyes: Negative.   Respiratory: Negative.   Cardiovascular: Negative for chest pain, palpitations and leg swelling.  Gastrointestinal: Negative.   Genitourinary: Negative.   Musculoskeletal: Positive for joint pain. Negative for back pain, falls, myalgias and neck pain.  Skin: Negative for itching and rash.  Neurological: Negative for dizziness, tingling, tremors, weakness and headaches.  Endo/Heme/Allergies: Negative for environmental allergies. Does not bruise/bleed easily.  Psychiatric/Behavioral: Negative.     Otherwise per HPI.  HISTORY & PERTINENT PRIOR DATA:  No specialty comments available. She reports that she has never smoked. She has never used smokeless tobacco.   Recent Labs  11/27/15 1029 06/01/16 1101  HGBA1C 6.1 6.4   Medications & Allergies reviewed per EMR Patient Active Problem List   Diagnosis Date Noted  . Chronic left shoulder pain 10/05/2016  . Multiple  allergies 06/01/2016  . PCP NOTES >>>>>>>>>>>>>>>>>>> 11/27/2015  . Fatigue 03/19/2014  . Vertigo 09/19/2013  . Preop examination 02/05/2013  . Annual physical exam 02/05/2011  . DM (diabetes mellitus) type II controlled, neurological manifestation ([place]) 05/01/2008  . Dyslipidemia --intolerant to medications 05/01/2008  . Back pain, chronic-sees Dr.Ramos 10/11/2007  . HTN (hypertension) 11/30/2006  . S/P lobectomy of lung 11/30/2006  . OBSTRUCTION, URETERIC NEC 11/30/2006   Past Medical History:  Diagnosis Date  . Back pain, chronic   . BCC (basal cell carcinoma of skin)    several, nose , 2012 L forehead  . Degeneration of lumbar intervertebral disc    Dr. Nelva Bush  . Diabetes mellitus   . HTN (hypertension)   . Hyperlipidemia   . Hyperparathyroidism   . Lumbago    Lumbar epidural steroid injection at L5-S1 midline by Dr. Nelva Bush  . Pelvic pain in female 2005    back and pelvic pain, s/p w-u, CT (-), u/s showed a thick endometrium, s/p Bx per gyn  . Ureteric obstruction    hydronephrosis,sees urology   Family History  Problem Relation Age of Onset  . Diabetes Brother        X 2  . Hypertension Brother   . Hypertension Mother   . Brain cancer Mother        tumor  . CAD Father        MI at 65  . Breast cancer Neg Hx   . Colon cancer Neg Hx    Past Surgical History:  Procedure Laterality Date  . CYSTECTOMY  1989   thyriod  . LOBECTOMY  1989   s/p  RML lung lobectomy, biopsy showed precancerous changes   . POLYPECTOMY     colon   Social History   Occupational History  . retired Advertising account executive   Social History Main Topics  . Smoking status: Never Smoker  . Smokeless tobacco: Never Used  . Alcohol use Yes     Comment: Rarely  . Drug use: No  . Sexual activity: Not on file    OBJECTIVE:  VS:  HT:_0  (167.6 cm)   WT:186 lb 9.6 oz (84.6 kg)  BMI:30.2    BP:(!) 150/80  HR:74bpm  TEMP: ( )  RESP:97 % EXAM: Findings:  Shoulders  overall well aligned.  She has good range of motion.  Negative Hawkins and Neer's.  Rotator cuff strength is 4+/5 diffusely. No pain with arm squeeze test or brachial plexus squeeze.  Cervical range of motion slightly limited with side and rotation but this is minimal.     No results found. ASSESSMENT & PLAN:   Problem List Items Addressed This Visit    Chronic left shoulder pain - Primary    Significantly better.  Minimal symptoms.  Continue with home therapeutic exercises previously reviewed.  Follow-up if any lack of improvement.  Consider further diagnostic evaluation if persistent symptoms.         Follow-up: Return if symptoms worsen or fail to improve.    CMA/ATC served as Education administrator during this visit. History, Physical, and Plan performed by medical provider. Documentation and orders reviewed and attested to.      Teresa Coombs, [place] [facility] Physician

## 2016-11-09 ENCOUNTER — Encounter: Payer: Self-pay | Admitting: Internal Medicine

## 2016-11-09 ENCOUNTER — Ambulatory Visit (INDEPENDENT_AMBULATORY_CARE_PROVIDER_SITE_OTHER): Payer: Medicare Other | Admitting: Internal Medicine

## 2016-11-09 VITALS — BP 136/80 | HR 82 | Temp 98.0°F | Resp 14 | Ht 66.0 in | Wt 186.1 lb

## 2016-11-09 DIAGNOSIS — J019 Acute sinusitis, unspecified: Secondary | ICD-10-CM

## 2016-11-09 DIAGNOSIS — R399 Unspecified symptoms and signs involving the genitourinary system: Secondary | ICD-10-CM | POA: Diagnosis not present

## 2016-11-09 DIAGNOSIS — N3000 Acute cystitis without hematuria: Secondary | ICD-10-CM | POA: Diagnosis not present

## 2016-11-09 MED ORDER — AZELASTINE HCL 0.1 % NA SOLN: 2.0000 | Freq: Two times a day (BID) | NASAL | 6 refills | Status: DC

## 2016-11-09 NOTE — Progress Notes (Signed)
Pre visit review using our clinic review tool, if applicable. No additional management support is needed unless otherwise documented below in the visit note. 

## 2016-11-09 NOTE — Patient Instructions (Addendum)
Will call you about the results  Continue with Flonase once a day  Take Astelin 2 sprays in each side of the nose twice a day, that should help with your sinus congestion.

## 2016-11-09 NOTE — Progress Notes (Signed)
Subjective:    Patient ID: Natalie [name], female    DOB: 03/28/1933, 83 y.o.   MRN: [id number]  DOS:  11/09/2016 Type of visit - description : acute Interval history: Due for spinal injection, Dr. Nelva Bush likes to be sure she does not have active infection (sinusitis, UTI) before he proceeds. She had a documented UTI few weeks ago and took Levaquin. Symptoms are better, still has occasional mild dysuria. Also, has on and off sinus congestion and clear nasal discharge. History of allergies, saw Dr. Donneta Romberg, got a cxr (results?) and a zpack last month d/t respiratory sx w/o improvement  Review of Systems No fever chills No gross hematuria, difficulty urinating or urinary frequency.  Past Medical History:  Diagnosis Date  . Back pain, chronic   . BCC (basal cell carcinoma of skin)    several, nose , 2012 L forehead  . Degeneration of lumbar intervertebral disc    Dr. Nelva Bush  . Diabetes mellitus   . HTN (hypertension)   . Hyperlipidemia   . Hyperparathyroidism   . Lumbago    Lumbar epidural steroid injection at L5-S1 midline by Dr. Nelva Bush  . Pelvic pain in female 2005    back and pelvic pain, s/p w-u, CT (-), u/s showed a thick endometrium, s/p Bx per gyn  . Ureteric obstruction    hydronephrosis,sees urology    Past Surgical History:  Procedure Laterality Date  . CYSTECTOMY  1989   thyriod  . LOBECTOMY  1989   s/p  RML lung lobectomy, biopsy showed precancerous changes   . POLYPECTOMY     colon    Social History   Social History  . Marital status: Married    Spouse name: N/A  . Number of children: 4  . Years of education: N/A   Occupational History  . retired Advertising account executive   Social History Main Topics  . Smoking status: Never Smoker  . Smokeless tobacco: Never Used  . Alcohol use Yes     Comment: Rarely  . Drug use: No  . Sexual activity: Not on file   Other Topics Concern  . Not on file   Social History Narrative   Lives  w/ husband                   Allergies as of 11/09/2016      Reactions   Cefixime    unknown   Cefprozil Nausea And Vomiting   Cefuroxime Axetil Nausea And Vomiting   Nitrofurantoin    unknown   Penicillins    unknown   Sulfonamide Derivatives    unknown      Medication List       Accurate as of 11/09/16 11:59 PM. Always use your most recent med list.          acetaminophen 500 MG tablet Commonly known as:  TYLENOL Take 1,250 mg by mouth every 6 (six) hours as needed for mild pain.   albuterol 108 (90 Base) MCG/ACT inhaler Commonly known as:  PROVENTIL HFA;VENTOLIN HFA Inhale 2 puffs into the lungs every 6 (six) hours as needed for wheezing.   aspirin 81 MG tablet Take 81 mg by mouth daily. Reported on 05/30/2015   azelastine 0.1 % nasal spray Commonly known as:  ASTELIN Place 2 sprays into both nostrils 2 (two) times daily.   BIOTIN PO Take by mouth.   calcium carbonate 500 MG chewable tablet Commonly known as:  TUMS - dosed in mg  elemental calcium Chew 1 tablet by mouth daily.   CINNAMON PO Take by mouth daily. Reported on 05/30/2015   EPIPEN 2-PAK 0.3 mg/0.3 mL Soaj injection Generic drug:  EPINEPHrine Reported on 11/27/2015   famotidine 20 MG tablet Commonly known as:  PEPCID Take 20 mg by mouth 2 (two) times daily as needed.   fluticasone 110 MCG/ACT inhaler Commonly known as:  FLOVENT HFA Inhale 1-2 puffs into the lungs 2 (two) times daily.   fluticasone 50 MCG/ACT nasal spray Commonly known as:  FLONASE Place 2 sprays into the nose daily.   furosemide 20 MG tablet Commonly known as:  LASIX Take 1.5 tablets (30 mg total) by mouth daily.   glucose blood test strip Commonly known as:  ONE TOUCH ULTRA TEST Check blood sugar once daily as directed.   HYDROcodone-acetaminophen 10-325 MG tablet Commonly known as:  NORCO Take 1 tablet by mouth every 8 (eight) hours as needed.   lisinopril 20 MG tablet Commonly known as:   PRINIVIL,ZESTRIL Take 1 tablet (20 mg total) by mouth daily.   loratadine 10 MG tablet Commonly known as:  CLARITIN Take 10 mg by mouth daily.   MAALOX ANTI-GAS PO Take by mouth as needed. Any anti-gas medication[tablet as needed].   metFORMIN 500 MG tablet Commonly known as:  GLUCOPHAGE Take 1.5 tablets (750 mg total) by mouth 2 (two) times daily with a meal.   multivitamin,tx-minerals tablet Take 1 tablet by mouth daily.   naproxen sodium 220 MG tablet Commonly known as:  ANAPROX Take 220 mg by mouth daily.   NON FORMULARY ALLERGY INJECTIONS.   ONETOUCH DELICA LANCETS FINE Misc use to TEST ONCE DAILY   tetrahydrozoline 0.05 % ophthalmic solution Place 2 drops into both eyes daily.          Objective:   Physical Exam BP 136/80 (BP Location: Left Arm, Patient Position: Sitting, Cuff Size: Normal)   Pulse 82   Temp 98 F (36.7 C) (Oral)   Resp 14   Ht _0  (1.676 m)   Wt 186 lb 2 oz (84.4 kg)   SpO2 97%   BMI 30.04 kg/m  General:   Well developed, well nourished . NAD.  HEENT:  Normocephalic . Face symmetric, atraumatic TMs normal, throat symmetric and not red, nose is slightly congested, sinuses with minimal TTP at both maxillary areas. Lungs:  CTA B Normal respiratory effort, no intercostal retractions, no accessory muscle use. Heart: RRR,  no murmur.  No pretibial edema bilaterally  Skin: Not pale. Not jaundice Neurologic:  alert & oriented X3.  Speech normal, gait appropriate for age and unassisted Psych--  Cognition and judgment appear intact.  Cooperative with normal attention span and concentration.  Behavior appropriate. No anxious or depressed appearing.      Assessment & Plan:   Assessment DM, complicated by neuropathy and callus HTN Hyperlipidemia, intolerant to medications  Allergies, B-spasm, get shots----- see Dr Donneta Romberg  Fatigue, chronic GU: Hydronephrosis , used to see  urology MSK: --DJD --Chronic back pain, dr Nelva Bush , he  rx hydrocodone BCC H/o pelvic pain, 2005, w/u per gyn H/o hyperparathyroidism H/o RML Lobectomy, 1989, bx precancerous cells  PLAN H/o  recent sinusitis and a UTI: Here because Dr Nelva Bush  likes to be sure she is free of active infection before they proceed with a injection in her back. On clinical grounds there is no much evidence of any active infection. For the sinus sxs recommend to continue with Flonase and add  Astelin  twice a day. For the recent UTI will check a UA and urine culture, if that is negative will recommend to proceed with a local injection. Due for a routine check up. Follow-up next month as already scheduled

## 2016-11-10 LAB — URINALYSIS, ROUTINE W REFLEX MICROSCOPIC
Bilirubin Urine: NEGATIVE
Hgb urine dipstick: NEGATIVE
Leukocytes, UA: NEGATIVE
Nitrite: NEGATIVE
RBC / HPF: NONE SEEN (ref 0–?)
Specific Gravity, Urine: 1.015 (ref 1.000–1.030)
Total Protein, Urine: NEGATIVE
Urine Glucose: NEGATIVE
Urobilinogen, UA: 0.2 (ref 0.0–1.0)
pH: 6 (ref 5.0–8.0)

## 2016-11-11 NOTE — Assessment & Plan Note (Signed)
H/o  recent sinusitis and a UTI: Here because Dr Nelva Bush  likes to be sure she is free of active infection before they proceed with a injection in her back. On clinical grounds there is no much evidence of any active infection. For the sinus sxs recommend to continue with Flonase and add  Astelin twice a day. For the recent UTI will check a UA and urine culture, if that is negative will recommend to proceed with a local injection. Due for a routine check up. Follow-up next month as already scheduled

## 2016-11-12 ENCOUNTER — Other Ambulatory Visit: Payer: Self-pay | Admitting: Internal Medicine

## 2016-11-12 LAB — URINE CULTURE

## 2016-11-14 NOTE — Assessment & Plan Note (Signed)
Significantly better.  Minimal symptoms.  Continue with home therapeutic exercises previously reviewed.  Follow-up if any lack of improvement.  Consider further diagnostic evaluation if persistent symptoms.

## 2016-11-15 MED ORDER — LEVOFLOXACIN 250 MG PO TABS: 250.0000 mg | ORAL_TABLET | Freq: Every day | ORAL | 0 refills | Status: DC

## 2016-11-15 NOTE — Addendum Note (Signed)
Addended byDamita Dunnings D on: 11/15/2016 08:33 AM   Modules accepted: Orders

## 2016-11-24 ENCOUNTER — Other Ambulatory Visit (INDEPENDENT_AMBULATORY_CARE_PROVIDER_SITE_OTHER): Payer: Medicare Other

## 2016-11-24 ENCOUNTER — Telehealth: Payer: Self-pay | Admitting: Internal Medicine

## 2016-11-24 DIAGNOSIS — N390 Urinary tract infection, site not specified: Secondary | ICD-10-CM

## 2016-11-24 LAB — URINALYSIS, ROUTINE W REFLEX MICROSCOPIC
Bilirubin Urine: NEGATIVE
Hgb urine dipstick: NEGATIVE
Leukocytes, UA: NEGATIVE
Nitrite: NEGATIVE
RBC / HPF: NONE SEEN (ref 0–?)
Specific Gravity, Urine: 1.025 (ref 1.000–1.030)
Total Protein, Urine: NEGATIVE
Urine Glucose: NEGATIVE
Urobilinogen, UA: 0.2 (ref 0.0–1.0)
pH: 5.5 (ref 5.0–8.0)

## 2016-11-24 NOTE — Telephone Encounter (Addendum)
LMOM informing Pt that orders have been placed at [facility] lab. Instructed to call if questions/concerns.

## 2016-11-24 NOTE — Telephone Encounter (Signed)
Okay UA and urine culture @  [facility], DX UTI

## 2016-11-24 NOTE — Telephone Encounter (Signed)
Patient is asking can she go to [facility] and have a urine check to make sure her uti has cleared up. She has to make sure her infection is cleared before her injection with Ramos can me done. She is still have some burning when she urinates.

## 2016-11-25 LAB — URINE CULTURE: Organism ID, Bacteria: NO GROWTH

## 2016-11-29 ENCOUNTER — Other Ambulatory Visit: Payer: Self-pay | Admitting: Internal Medicine

## 2016-12-06 ENCOUNTER — Telehealth: Payer: Self-pay | Admitting: *Deleted

## 2016-12-06 NOTE — Telephone Encounter (Signed)
LM for patient to return call to schedule AWV. Would like to try to schedule pt 12/08/16 @8 , prior to PCP appt

## 2016-12-08 ENCOUNTER — Ambulatory Visit (INDEPENDENT_AMBULATORY_CARE_PROVIDER_SITE_OTHER): Payer: Medicare Other | Admitting: Internal Medicine

## 2016-12-08 ENCOUNTER — Encounter: Payer: Self-pay | Admitting: Internal Medicine

## 2016-12-08 VITALS — BP 170/70 | HR 68 | Temp 98.6°F | Ht 66.0 in | Wt 186.0 lb

## 2016-12-08 DIAGNOSIS — Z0000 Encounter for general adult medical examination without abnormal findings: Secondary | ICD-10-CM | POA: Diagnosis not present

## 2016-12-08 DIAGNOSIS — E1140 Type 2 diabetes mellitus with diabetic neuropathy, unspecified: Secondary | ICD-10-CM

## 2016-12-08 DIAGNOSIS — N1330 Unspecified hydronephrosis: Secondary | ICD-10-CM

## 2016-12-08 LAB — CBC WITH DIFFERENTIAL/PLATELET
Basophils Absolute: 0.1 10*3/uL (ref 0.0–0.1)
Basophils Relative: 0.9 % (ref 0.0–3.0)
Eosinophils Absolute: 0.2 10*3/uL (ref 0.0–0.7)
Eosinophils Relative: 1.8 % (ref 0.0–5.0)
HCT: 40.7 % (ref 36.0–46.0)
Hemoglobin: 13.3 g/dL (ref 12.0–15.0)
Lymphocytes Relative: 38.1 % (ref 12.0–46.0)
Lymphs Abs: 3.2 10*3/uL (ref 0.7–4.0)
MCHC: 32.7 g/dL (ref 30.0–36.0)
MCV: 90.6 fl (ref 78.0–100.0)
Monocytes Absolute: 0.7 10*3/uL (ref 0.1–1.0)
Monocytes Relative: 8.5 % (ref 3.0–12.0)
Neutro Abs: 4.3 10*3/uL (ref 1.4–7.7)
Neutrophils Relative %: 50.7 % (ref 43.0–77.0)
Platelets: 305 10*3/uL (ref 150.0–400.0)
RBC: 4.49 Mil/uL (ref 3.87–5.11)
RDW: 13.6 % (ref 11.5–15.5)
WBC: 8.5 10*3/uL (ref 4.0–10.5)

## 2016-12-08 LAB — HEPATIC FUNCTION PANEL
ALT: 13 U/L (ref 0–35)
AST: 17 U/L (ref 0–37)
Albumin: 4.3 g/dL (ref 3.5–5.2)
Alkaline Phosphatase: 47 U/L (ref 39–117)
Bilirubin, Direct: 0.1 mg/dL (ref 0.0–0.3)
Total Bilirubin: 0.4 mg/dL (ref 0.2–1.2)
Total Protein: 7.3 g/dL (ref 6.0–8.3)

## 2016-12-08 LAB — HEMOGLOBIN A1C: Hgb A1c MFr Bld: 6.1 % (ref 4.6–6.5)

## 2016-12-08 MED ORDER — AMLODIPINE BESYLATE 5 MG PO TABS: 5.0000 mg | ORAL_TABLET | Freq: Every day | ORAL | 6 refills | Status: DC

## 2016-12-08 MED ORDER — ZOSTER VAC RECOMB ADJUVANTED 50 MCG/0.5ML IM SUSR: 0.5000 mL | Freq: Once | INTRAMUSCULAR | 1 refills | Status: AC

## 2016-12-08 NOTE — Progress Notes (Signed)
Subjective:    Patient ID: Natalie [name], female    DOB: 08-Jan-1933, 83 y.o.   MRN: [id number]  DOS:  12/08/2016 Type of visit - description : cpx Interval history: Has few concerns   Review of Systems  Having mild dysuria on and off for the last 2 days, mostly in the morning. Denies fever chills. No nausea or vomiting. No flank pain. No gross hematuria, urinary frequency. Denies vaginal bleeding or discharge. She does have some vaginal dryness.  BP today slightly elevated, was elevated when she went to see Dr. Nelva Bush. When she checks her BP at the pharmacy is normal.  History of hydronephrosis, recently had a  UTIs and she is quite concerned about it.  Occ dizziness and imbalance, not a new issue.  Other than above, a 14 point review of systems is negative     Past Medical History:  Diagnosis Date  . Back pain, chronic   . BCC (basal cell carcinoma of skin)    several, nose , 2012 L forehead  . Degeneration of lumbar intervertebral disc    Dr. Nelva Bush  . Diabetes mellitus   . HTN (hypertension)   . Hyperlipidemia   . Hyperparathyroidism   . Lumbago    Lumbar epidural steroid injection at L5-S1 midline by Dr. Nelva Bush  . Pelvic pain in female 2005    back and pelvic pain, s/p w-u, CT (-), u/s showed a thick endometrium, s/p Bx per gyn  . Ureteric obstruction    hydronephrosis,sees urology    Past Surgical History:  Procedure Laterality Date  . CYSTECTOMY  1989   thyriod  . LOBECTOMY  1989   s/p  RML lung lobectomy, biopsy showed precancerous changes   . POLYPECTOMY     colon    Social History   Social History  . Marital status: Married    Spouse name: N/A  . Number of children: 4  . Years of education: N/A   Occupational History  . retired Advertising account executive   Social History Main Topics  . Smoking status: Never Smoker  . Smokeless tobacco: Never Used  . Alcohol use Yes     Comment: Rarely  . Drug use: No  . Sexual activity:  Not on file   Other Topics Concern  . Not on file   Social History Narrative   Lives w/ husband                  Family History  Problem Relation Age of Onset  . Diabetes Brother        X 2  . Hypertension Brother   . Hypertension Mother   . Brain cancer Mother        tumor  . CAD Father        MI at 20  . Breast cancer Neg Hx   . Colon cancer Neg Hx      Allergies as of 12/08/2016      Reactions   Cefixime    unknown   Cefprozil Nausea And Vomiting   Cefuroxime Axetil Nausea And Vomiting   Nitrofurantoin    unknown   Penicillins    unknown   Sulfonamide Derivatives    unknown      Medication List       Accurate as of 12/08/16 11:59 PM. Always use your most recent med list.          acetaminophen 500 MG tablet Commonly known as:  TYLENOL Take  1,250 mg by mouth every 6 (six) hours as needed for mild pain.   albuterol 108 (90 Base) MCG/ACT inhaler Commonly known as:  PROVENTIL HFA;VENTOLIN HFA Inhale 2 puffs into the lungs every 6 (six) hours as needed for wheezing.   amLODipine 5 MG tablet Commonly known as:  NORVASC Take 1 tablet (5 mg total) by mouth daily.   aspirin 81 MG tablet Take 81 mg by mouth daily. Reported on 05/30/2015   azelastine 0.1 % nasal spray Commonly known as:  ASTELIN Place 2 sprays into both nostrils 2 (two) times daily.   BIOTIN PO Take by mouth.   calcium carbonate 500 MG chewable tablet Commonly known as:  TUMS - dosed in mg elemental calcium Chew 1 tablet by mouth daily.   CINNAMON PO Take by mouth daily. Reported on 05/30/2015   EPIPEN 2-PAK 0.3 mg/0.3 mL Soaj injection Generic drug:  EPINEPHrine Reported on 11/27/2015   famotidine 20 MG tablet Commonly known as:  PEPCID Take 20 mg by mouth 2 (two) times daily as needed.   fluticasone 110 MCG/ACT inhaler Commonly known as:  FLOVENT HFA Inhale 1-2 puffs into the lungs 2 (two) times daily.   fluticasone 50 MCG/ACT nasal spray Commonly known as:   FLONASE Place 2 sprays into the nose daily.   furosemide 20 MG tablet Commonly known as:  LASIX Take 1.5 tablets (30 mg total) by mouth daily.   glucose blood test strip Commonly known as:  ONE TOUCH ULTRA TEST Check blood sugar once daily as directed.   HYDROcodone-acetaminophen 10-325 MG tablet Commonly known as:  NORCO Take 1 tablet by mouth every 8 (eight) hours as needed.   lisinopril 20 MG tablet Commonly known as:  PRINIVIL,ZESTRIL Take 1 tablet (20 mg total) by mouth daily.   loratadine 10 MG tablet Commonly known as:  CLARITIN Take 10 mg by mouth daily.   MAALOX ANTI-GAS PO Take by mouth as needed. Any anti-gas medication[tablet as needed].   metFORMIN 500 MG tablet Commonly known as:  GLUCOPHAGE Take 1.5 tablets (750 mg total) by mouth 2 (two) times daily with a meal.   multivitamin,tx-minerals tablet Take 1 tablet by mouth daily.   naproxen sodium 220 MG tablet Commonly known as:  ANAPROX Take 220 mg by mouth daily.   NON FORMULARY ALLERGY INJECTIONS.   ONETOUCH DELICA LANCETS FINE Misc use to TEST ONCE DAILY   tetrahydrozoline 0.05 % ophthalmic solution Place 2 drops into both eyes daily.   Zoster Vac Recomb Adjuvanted injection Commonly known as:  SHINGRIX Inject 0.5 mLs into the muscle once.          Objective:   Physical Exam BP (!) 170/70 (BP Location: Left Arm, Patient Position: Sitting, Cuff Size: Normal)   Pulse 68   Temp 98.6 F (37 C) (Oral)   Ht '5\' 6"'$  (1.676 m)   Wt 186 lb (84.4 kg)   SpO2 98%   BMI 30.02 kg/m   General:   Well developed, well nourished . NAD.  Neck: No  thyromegaly  HEENT:  Normocephalic . Face symmetric, atraumatic Lungs:  CTA B Normal respiratory effort, no intercostal retractions, no accessory muscle use. Heart: RRR,  no murmur.  No pretibial edema bilaterally  Abdomen:  Not distended, soft, non-tender. No rebound or rigidity.   Skin: Exposed areas without rash. Not pale. Not jaundice Neurologic:   alert & oriented X3.  Speech normal, gait appropriate for age and unassisted Strength symmetric and appropriate for age.  Psych: Cognition and  judgment appear intact.  Cooperative with normal attention span and concentration.  Behavior appropriate. No anxious or depressed appearing.    Assessment & Plan:    Assessment DM, complicated by neuropathy and callus HTN Hyperlipidemia, intolerant to medications , not checking labs Allergies, B-spasm, get shots----- sees Dr Donneta Romberg  Fatigue, chronic GU: Hydronephrosis dx ~ 2007, follow-up with serial USs. DX was right UPJ obstruction. Had a Lasix renogram 2013 ( R 46%, L 54%) Dr. Jasmine December, recommend to RTC PRN MSK: --DJD --Chronic back pain, dr Nelva Bush , he rx hydrocodone BCC H/o pelvic pain, 2005, w/u per gyn H/o hyperparathyroidism H/o RML Lobectomy, 1989, bx precancerous cells  PLAN DM: On metformin, check labs. HTN:   on lisinopril, Lasix. BP 170/70   today, was elevated  at Dr. Nelva Bush office. Add amlodipine 5 mg. See instructions Hyperlipidemia, intolerant to meds, not checking labs. Dysuria: Mild dysuria for the last 2-3 days but no other symptoms, last UCX (-), admits to vaginal dryness, related? Rec to see gynecology d/t vaginal dryness. States that she will call. H/o hydronephrosis: Patient concern about the issue due to recent UTI, will check renal ultrasound. RTC 4 months

## 2016-12-08 NOTE — Assessment & Plan Note (Addendum)
-  Td 2009;  pneumonia shot 04-2005 , 2013;   prevnar 2013;  zostavax 2011; shingres pro-cons discussed rx issued  -Female care PAPS: no further screening. Gyn visit  Recommended, see a/p  MMG 08-2015 neg  DEXA 11-2006 , 12-2008 and 02-2013 -----> normal.  Per guidelines no need for a dexa this year, consider one in few years if still a candidate . Continue ca and vit D CCS: last colonoscopy 7-11, due for Cscope per GI letter, again pros-cons discussed, will think about   u/s 05-2011 neg for AAA Fall prevention

## 2016-12-08 NOTE — Progress Notes (Signed)
Pre visit review using our clinic tool,if applicable. No additional management support is needed unless otherwise documented below in the visit note.  

## 2016-12-08 NOTE — Patient Instructions (Addendum)
GO TO THE LAB : Get the blood work     GO TO THE FRONT DESK Schedule your next appointment for a  checkup in 4 months   Please see your gynecologist  We will schedule ultrasound of your kidney  A medication: Amlodipine, one tablet daily, will help your blood pressure.   Check the  blood pressure 2 or 3 times a   week   Be sure your blood pressure is between 110/65 and  145/85. If it is consistently higher or lower, let me know    Fall Prevention in the Home Falls can cause injuries and can affect people from all age groups. There are many simple things that you can do to make your home safe and to help prevent falls. What can I do on the outside of my home?  Regularly repair the edges of walkways and driveways and fix any cracks.  Remove high doorway thresholds.  Trim any shrubbery on the main path into your home.  Use bright outdoor lighting.  Clear walkways of debris and clutter, including tools and rocks.  Regularly check that handrails are securely fastened and in good repair. Both sides of any steps should have handrails.  Install guardrails along the edges of any raised decks or porches.  Have leaves, snow, and ice cleared regularly.  Use sand or salt on walkways during winter months.  In the garage, clean up any spills right away, including grease or oil spills. What can I do in the bathroom?  Use night lights.  Install grab bars by the toilet and in the tub and shower. Do not use towel bars as grab bars.  Use non-skid mats or decals on the floor of the tub or shower.  If you need to sit down while you are in the shower, use a plastic, non-slip stool.  Keep the floor dry. Immediately clean up any water that spills on the floor.  Remove soap buildup in the tub or shower on a regular basis.  Attach bath mats securely with double-sided non-slip rug tape.  Remove throw rugs and other tripping hazards from the floor. What can I do in the bedroom?  Use  night lights.  Make sure that a bedside light is easy to reach.  Do not use oversized bedding that drapes onto the floor.  Have a firm chair that has side arms to use for getting dressed.  Remove throw rugs and other tripping hazards from the floor. What can I do in the kitchen?  Clean up any spills right away.  Avoid walking on wet floors.  Place frequently used items in easy-to-reach places.  If you need to reach for something above you, use a sturdy step stool that has a grab bar.  Keep electrical cables out of the way.  Do not use floor polish or wax that makes floors slippery. If you have to use wax, make sure that it is non-skid floor wax.  Remove throw rugs and other tripping hazards from the floor. What can I do in the stairways?  Do not leave any items on the stairs.  Make sure that there are handrails on both sides of the stairs. Fix handrails that are broken or loose. Make sure that handrails are as long as the stairways.  Check any carpeting to make sure that it is firmly attached to the stairs. Fix any carpet that is loose or worn.  Avoid having throw rugs at the top or bottom of stairways, or secure  the rugs with carpet tape to prevent them from moving.  Make sure that you have a light switch at the top of the stairs and the bottom of the stairs. If you do not have them, have them installed. What are some other fall prevention tips?  Wear closed-toe shoes that fit well and support your feet. Wear shoes that have rubber soles or low heels.  When you use a stepladder, make sure that it is completely opened and that the sides are firmly locked. Have someone hold the ladder while you are using it. Do not climb a closed stepladder.  Add color or contrast paint or tape to grab bars and handrails in your home. Place contrasting color strips on the first and last steps.  Use mobility aids as needed, such as canes, walkers, scooters, and crutches.  Turn on lights if  it is dark. Replace any light bulbs that burn out.  Set up furniture so that there are clear paths. Keep the furniture in the same spot.  Fix any uneven floor surfaces.  Choose a carpet design that does not hide the edge of steps of a stairway.  Be aware of any and all pets.  Review your medicines with your healthcare provider. Some medicines can cause dizziness or changes in blood pressure, which increase your risk of falling. Talk with your health care provider about other ways that you can decrease your risk of falls. This may include working with a physical therapist or trainer to improve your strength, balance, and endurance. This information is not intended to replace advice given to you by your health care provider. Make sure you discuss any questions you have with your health care provider. Document Released: 04/23/2002 Document Revised: 09/30/2015 Document Reviewed: 06/07/2014 Elsevier Interactive Patient Education  2017 [employer].

## 2016-12-09 ENCOUNTER — Encounter: Payer: Self-pay | Admitting: Internal Medicine

## 2016-12-09 NOTE — Assessment & Plan Note (Signed)
DM: On metformin, check labs. HTN:   on lisinopril, Lasix. BP 170/70   today, was elevated  at Dr. Nelva Bush office. Add amlodipine 5 mg. See instructions Hyperlipidemia, intolerant to meds, not checking labs. Dysuria: Mild dysuria for the last 2-3 days but no other symptoms, last UCX (-), admits to vaginal dryness, related? Rec to see gynecology d/t vaginal dryness. States that she will call. H/o hydronephrosis: Patient concern about the issue due to recent UTI, will check renal ultrasound. RTC 4 months

## 2016-12-10 ENCOUNTER — Ambulatory Visit (HOSPITAL_BASED_OUTPATIENT_CLINIC_OR_DEPARTMENT_OTHER)
Admission: RE | Admit: 2016-12-10 | Discharge: 2016-12-10 | Disposition: A | Discharge status: Home / Self Care | Payer: Medicare Other | Source: Ambulatory Visit | Attending: Internal Medicine | Admitting: Internal Medicine

## 2016-12-10 DIAGNOSIS — N1330 Unspecified hydronephrosis: Secondary | ICD-10-CM | POA: Insufficient documentation

## 2016-12-10 DIAGNOSIS — N281 Cyst of kidney, acquired: Secondary | ICD-10-CM | POA: Insufficient documentation

## 2017-01-27 ENCOUNTER — Ambulatory Visit: Payer: Self-pay | Admitting: Obstetrics & Gynecology

## 2017-02-22 ENCOUNTER — Ambulatory Visit (INDEPENDENT_AMBULATORY_CARE_PROVIDER_SITE_OTHER): Payer: Medicare Other | Admitting: Obstetrics & Gynecology

## 2017-02-22 ENCOUNTER — Encounter: Payer: Self-pay | Admitting: Obstetrics & Gynecology

## 2017-02-22 VITALS — BP 138/84 | Ht 63.0 in | Wt 190.0 lb

## 2017-02-22 DIAGNOSIS — R350 Frequency of micturition: Secondary | ICD-10-CM | POA: Diagnosis not present

## 2017-02-22 DIAGNOSIS — Z124 Encounter for screening for malignant neoplasm of cervix: Secondary | ICD-10-CM

## 2017-02-22 DIAGNOSIS — N393 Stress incontinence (female) (male): Secondary | ICD-10-CM

## 2017-02-22 DIAGNOSIS — Z780 Asymptomatic menopausal state: Secondary | ICD-10-CM

## 2017-02-22 DIAGNOSIS — Z01419 Encounter for gynecological examination (general) (routine) without abnormal findings: Secondary | ICD-10-CM

## 2017-02-22 NOTE — Patient Instructions (Signed)
1. Encounter for routine gynecological examination with Papanicolaou smear of cervix Normal gyn exam.  Pap reflex done.  Breasts wnl.  Will schedule screening Mammo.  Schedule Colonoscopy.  Full body skin exam to schedule.  2. Menopause present No HRT.  No PMB.  Vit D supplement.  Ca++ in nutrition.  Physically active.  Will schedule Bone Density here.  3. Frequency of urination U/A negative today.  Recommend drinking more water, less ice tea.  If recurrence of Cystitis, will consider Antibioprophylaxis. - Urinalysis with Culture Reflex  4. SUI (stress urinary incontinence, female) Recommend Kegels.  Increase water consumption, decrease ice tea and other bladder irritants.  Natalie [name], it was a pleasure to meet you today!  I will inform you of your results as soon as available.   Kegel Exercises Kegel exercises help strengthen the muscles that support the rectum, vagina, small intestine, bladder, and uterus. Doing Kegel exercises can help:  Improve bladder and bowel control.  Improve sexual response.  Reduce problems and discomfort during pregnancy.  Kegel exercises involve squeezing your pelvic floor muscles, which are the same muscles you squeeze when you try to stop the flow of urine. The exercises can be done while sitting, standing, or lying down, but it is best to vary your position. Phase 1 exercises 1. Squeeze your pelvic floor muscles tight. You should feel a tight lift in your rectal area. If you are a female, you should also feel a tightness in your vaginal area. Keep your stomach, buttocks, and legs relaxed. 2. Hold the muscles tight for up to 10 seconds. 3. Relax your muscles. Repeat this exercise 50 times a day or as many times as told by your health care provider. Continue to do this exercise for at least 4-6 weeks or for as long as told by your health care provider. This information is not intended to replace advice given to you by your health care provider. Make sure you  discuss any questions you have with your health care provider. Document Released: 04/19/2012 Document Revised: 12/27/2015 Document Reviewed: 03/23/2015 Elsevier Interactive Patient Education  2018 [place] Maintenance for Postmenopausal Women Menopause is a normal process in which your reproductive ability comes to an end. This process happens gradually over a span of months to years, usually between the ages of 47 and 44. Menopause is complete when you have missed 12 consecutive menstrual periods. It is important to talk with your health care provider about some of the most common conditions that affect postmenopausal women, such as heart disease, cancer, and bone loss (osteoporosis). Adopting a healthy lifestyle and getting preventive care can help to promote your health and wellness. Those actions can also lower your chances of developing some of these common conditions. What should I know about menopause? During menopause, you may experience a number of symptoms, such as:  Moderate-to-severe hot flashes.  Night sweats.  Decrease in sex drive.  Mood swings.  Headaches.  Tiredness.  Irritability.  Memory problems.  Insomnia.  Choosing to treat or not to treat menopausal changes is an individual decision that you make with your health care provider. What should I know about hormone replacement therapy and supplements? Hormone therapy products are effective for treating symptoms that are associated with menopause, such as hot flashes and night sweats. Hormone replacement carries certain risks, especially as you become older. If you are thinking about using estrogen or estrogen with progestin treatments, discuss the benefits and risks with your health care provider. What should  I know about heart disease and stroke? Heart disease, heart attack, and stroke become more likely as you age. This may be due, in part, to the hormonal changes that your body experiences during  menopause. These can affect how your body processes dietary fats, triglycerides, and cholesterol. Heart attack and stroke are both medical emergencies. There are many things that you can do to help prevent heart disease and stroke:  Have your blood pressure checked at least every 1-2 years. High blood pressure causes heart disease and increases the risk of stroke.  If you are 85-18 years old, ask your health care provider if you should take aspirin to prevent a heart attack or a stroke.  Do not use any tobacco products, including cigarettes, chewing tobacco, or electronic cigarettes. If you need help quitting, ask your health care provider.  It is important to eat a healthy diet and maintain a healthy weight. ? Be sure to include plenty of vegetables, fruits, low-fat dairy products, and lean protein. ? Avoid eating foods that are high in solid fats, added sugars, or salt (sodium).  Get regular exercise. This is one of the most important things that you can do for your health. ? Try to exercise for at least 150 minutes each week. The type of exercise that you do should increase your heart rate and make you sweat. This is known as moderate-intensity exercise. ? Try to do strengthening exercises at least twice each week. Do these in addition to the moderate-intensity exercise.  Know your numbers.Ask your health care provider to check your cholesterol and your blood glucose. Continue to have your blood tested as directed by your health care provider.  What should I know about cancer screening? There are several types of cancer. Take the following steps to reduce your risk and to catch any cancer development as early as possible. Breast Cancer  Practice breast self-awareness. ? This means understanding how your breasts normally appear and feel. ? It also means doing regular breast self-exams. Let your health care provider know about any changes, no matter how small.  If you are 108 or older,  have a clinician do a breast exam (clinical breast exam or CBE) every year. Depending on your age, family history, and medical history, it may be recommended that you also have a yearly breast X-ray (mammogram).  If you have a family history of breast cancer, talk with your health care provider about genetic screening.  If you are at high risk for breast cancer, talk with your health care provider about having an MRI and a mammogram every year.  Breast cancer (BRCA) gene test is recommended for women who have family members with BRCA-related cancers. Results of the assessment will determine the need for genetic counseling and BRCA1 and for BRCA2 testing. BRCA-related cancers include these types: ? Breast. This occurs in males or females. ? Ovarian. ? Tubal. This may also be called fallopian tube cancer. ? Cancer of the abdominal or pelvic lining (peritoneal cancer). ? Prostate. ? Pancreatic.  Cervical, Uterine, and Ovarian Cancer Your health care provider may recommend that you be screened regularly for cancer of the pelvic organs. These include your ovaries, uterus, and vagina. This screening involves a pelvic exam, which includes checking for microscopic changes to the surface of your cervix (Pap test).  For women ages 21-65, health care providers may recommend a pelvic exam and a Pap test every three years. For women ages 49-65, they may recommend the Pap test and pelvic  exam, combined with testing for human papilloma virus (HPV), every five years. Some types of HPV increase your risk of cervical cancer. Testing for HPV may also be done on women of any age who have unclear Pap test results.  Other health care providers may not recommend any screening for nonpregnant women who are considered low risk for pelvic cancer and have no symptoms. Ask your health care provider if a screening pelvic exam is right for you.  If you have had past treatment for cervical cancer or a condition that could  lead to cancer, you need Pap tests and screening for cancer for at least 20 years after your treatment. If Pap tests have been discontinued for you, your risk factors (such as having a new sexual partner) need to be reassessed to determine if you should start having screenings again. Some women have medical problems that increase the chance of getting cervical cancer. In these cases, your health care provider may recommend that you have screening and Pap tests more often.  If you have a family history of uterine cancer or ovarian cancer, talk with your health care provider about genetic screening.  If you have vaginal bleeding after reaching menopause, tell your health care provider.  There are currently no reliable tests available to screen for ovarian cancer.  Lung Cancer Lung cancer screening is recommended for adults 41-81 years old who are at high risk for lung cancer because of a history of smoking. A yearly low-dose CT scan of the lungs is recommended if you:  Currently smoke.  Have a history of at least 30 pack-years of smoking and you currently smoke or have quit within the past 15 years. A pack-year is smoking an average of one pack of cigarettes per day for one year.  Yearly screening should:  Continue until it has been 15 years since you quit.  Stop if you develop a health problem that would prevent you from having lung cancer treatment.  Colorectal Cancer  This type of cancer can be detected and can often be prevented.  Routine colorectal cancer screening usually begins at age 37 and continues through age 31.  If you have risk factors for colon cancer, your health care provider may recommend that you be screened at an earlier age.  If you have a family history of colorectal cancer, talk with your health care provider about genetic screening.  Your health care provider may also recommend using home test kits to check for hidden blood in your stool.  A small camera at the  end of a tube can be used to examine your colon directly (sigmoidoscopy or colonoscopy). This is done to check for the earliest forms of colorectal cancer.  Direct examination of the colon should be repeated every 5-10 years until age 22. However, if early forms of precancerous polyps or small growths are found or if you have a family history or genetic risk for colorectal cancer, you may need to be screened more often.  Skin Cancer  Check your skin from head to toe regularly.  Monitor any moles. Be sure to tell your health care provider: ? About any new moles or changes in moles, especially if there is a change in a mole's shape or color. ? If you have a mole that is larger than the size of a pencil eraser.  If any of your family members has a history of skin cancer, especially at a young age, talk with your health care provider about genetic  screening.  Always use sunscreen. Apply sunscreen liberally and repeatedly throughout the day.  Whenever you are outside, protect yourself by wearing long sleeves, pants, a wide-brimmed hat, and sunglasses.  What should I know about osteoporosis? Osteoporosis is a condition in which bone destruction happens more quickly than new bone creation. After menopause, you may be at an increased risk for osteoporosis. To help prevent osteoporosis or the bone fractures that can happen because of osteoporosis, the following is recommended:  If you are 5-20 years old, get at least 1,000 mg of calcium and at least 600 mg of vitamin D per day.  If you are older than age 3 but younger than age 64, get at least 1,200 mg of calcium and at least 600 mg of vitamin D per day.  If you are older than age 9, get at least 1,200 mg of calcium and at least 800 mg of vitamin D per day.  Smoking and excessive alcohol intake increase the risk of osteoporosis. Eat foods that are rich in calcium and vitamin D, and do weight-bearing exercises several times each week as directed  by your health care provider. What should I know about how menopause affects my mental health? Depression may occur at any age, but it is more common as you become older. Common symptoms of depression include:  Low or sad mood.  Changes in sleep patterns.  Changes in appetite or eating patterns.  Feeling an overall lack of motivation or enjoyment of activities that you previously enjoyed.  Frequent crying spells.  Talk with your health care provider if you think that you are experiencing depression. What should I know about immunizations? It is important that you get and maintain your immunizations. These include:  Tetanus, diphtheria, and pertussis (Tdap) booster vaccine.  Influenza every year before the flu season begins.  Pneumonia vaccine.  Shingles vaccine.  Your health care provider may also recommend other immunizations. This information is not intended to replace advice given to you by your health care provider. Make sure you discuss any questions you have with your health care provider. Document Released: 06/25/2005 Document Revised: 11/21/2015 Document Reviewed: 02/04/2015 Elsevier Interactive Patient Education  2018 [employer].

## 2017-02-22 NOTE — Progress Notes (Signed)
Natalie [name] [birth year] [id number]   History:    83 y.o. G14P4 Married  RP:  Established patient presenting for annual gyn exam   HPI:  Menopause.  No HRT.  No PMB.  No pelvic pain.  Abstinent.  Breasts wnl.  Had 2 episodes of Cystitis recently, last 11/2016, treated by Dr Larose Kells.  Currently mild urinary frequency, no burning.  Mild SUI.  Tends to drink a lot of ice tea.  Breasts wnl.  BMs wnl but occasional bloated.  Past medical history,surgical history, family history and social history were all reviewed and documented in the EPIC chart.  Gynecologic History No LMP recorded. Patient is postmenopausal. Contraception: post menopausal status Last Pap: ?  Results were: normal Last mammogram: 08/2015. Results were: normal.  Will schedule now. Colono 2011 per patient benign polyps, 5 yrs.  Will schedule now. Dexa 2014.  Will schedule now.  Obstetric History OB History  Gravida Para Term Preterm AB Living  4 4       4   SAB TAB Ectopic Multiple Live Births               # Outcome Date GA Lbr Len/2nd Weight Sex Delivery Anes PTL Lv  4 Para           3 Para           2 Para           1 Para                ROS: A ROS was performed and pertinent positives and negatives are included in the history.  GENERAL: No fevers or chills. HEENT: No change in vision, no earache, sore throat or sinus congestion. NECK: No pain or stiffness. CARDIOVASCULAR: No chest pain or pressure. No palpitations. PULMONARY: No shortness of breath, cough or wheeze. GASTROINTESTINAL: No abdominal pain, nausea, vomiting or diarrhea, melena or bright red blood per rectum. GENITOURINARY: No urinary frequency, urgency, hesitancy or dysuria. MUSCULOSKELETAL: No joint or muscle pain, no back pain, no recent trauma. DERMATOLOGIC: No rash, no itching, no lesions. ENDOCRINE: No polyuria, polydipsia, no heat or cold intolerance. No recent change in weight. HEMATOLOGICAL: No anemia or easy bruising or bleeding. NEUROLOGIC: No  headache, seizures, numbness, tingling or weakness. PSYCHIATRIC: No depression, no loss of interest in normal activity or change in sleep pattern.     Exam:   BP 138/84   Ht 5\' 3"  (1.6 m)   Wt 190 lb (86.2 kg)   BMI 33.66 kg/m   Body mass index is 33.66 kg/m.  General appearance : Well developed well nourished female. No acute distress HEENT: Eyes: no retinal hemorrhage or exudates,  Neck supple, trachea midline, no carotid bruits, no thyroidmegaly Lungs: Clear to auscultation, no rhonchi or wheezes, or rib retractions  Heart: Regular rate and rhythm, no murmurs or gallops Breast:Examined in sitting and supine position were symmetrical in appearance, no palpable masses or tenderness,  no skin retraction, no nipple inversion, no nipple discharge, no skin discoloration, no axillary or supraclavicular lymphadenopathy Abdomen: no palpable masses or tenderness, no rebound or guarding Extremities: no edema or skin discoloration or tenderness  Pelvic: Vulva normal  Bartholin, Urethra, Skene Glands: Within normal limits             Vagina: No gross lesions or discharge  Cervix: No gross lesions or discharge.  Pap reflex done.  Uterus  AV, normal size, shape and consistency, non-tender and mobile  Adnexa  Without  masses or tenderness  Anus and perineum  normal     Assessment/Plan:  83 y.o. female for annual exam   1. Encounter for routine gynecological examination with Papanicolaou smear of cervix Normal gyn exam.  Pap reflex done.  Breasts wnl.  Will schedule screening Mammo.  Schedule Colonoscopy.  Full body skin exam to schedule.  2. Menopause present No HRT.  No PMB.  Vit D supplement.  Ca++ in nutrition.  Physically active.  Will schedule Bone Density here.  3. Frequency of urination U/A negative today.  Recommend drinking more water, less ice tea.  If recurrence of Cystitis, will consider Antibioprophylaxis. - Urinalysis with Culture Reflex  4. SUI (stress urinary  incontinence, female) Recommend Kegels.  Increase water consumption, decrease ice tea and other bladder irritants.  Counseling on above issues >50% x 20 minutes.  Princess Bruins MD, 2:51 PM 02/22/2017

## 2017-02-22 NOTE — Addendum Note (Signed)
Addended by: Thurnell Garbe A on: 02/22/2017 04:02 PM   Modules accepted: Orders

## 2017-02-23 LAB — PAP IG W/ RFLX HPV ASCU

## 2017-02-24 LAB — URINALYSIS W MICROSCOPIC + REFLEX CULTURE
Bacteria, UA: NONE SEEN /HPF
Bilirubin Urine: NEGATIVE
Glucose, UA: NEGATIVE
Hgb urine dipstick: NEGATIVE
Hyaline Cast: NONE SEEN /LPF
Ketones, ur: NEGATIVE
Nitrites, Initial: NEGATIVE
Protein, ur: NEGATIVE
Specific Gravity, Urine: 1.015 (ref 1.001–1.03)
pH: 6 (ref 5.0–8.0)

## 2017-02-24 LAB — URINE CULTURE
MICRO NUMBER:: 81128728
SPECIMEN QUALITY:: ADEQUATE

## 2017-02-24 LAB — CULTURE INDICATED

## 2017-02-28 ENCOUNTER — Telehealth: Payer: Self-pay | Admitting: *Deleted

## 2017-02-28 NOTE — Telephone Encounter (Signed)
-----   Message from Princess Bruins, MD sent at 02/22/2017  3:27 PM EDT ----- Regarding: Screening Colonoscopy Colonoscopy with Dr Collene Mares.

## 2017-02-28 NOTE — Telephone Encounter (Signed)
Office notes faxed to dr. Collene Mares office they will contact pt to schedule.

## 2017-03-22 ENCOUNTER — Other Ambulatory Visit: Payer: Self-pay | Admitting: Gynecology

## 2017-03-22 DIAGNOSIS — Z780 Asymptomatic menopausal state: Secondary | ICD-10-CM

## 2017-03-24 NOTE — Telephone Encounter (Signed)
Dr.Mann office sent me a fax stating pt goes to [place]. I left message for pt to call.

## 2017-03-25 ENCOUNTER — Encounter: Payer: Self-pay | Admitting: Internal Medicine

## 2017-04-01 ENCOUNTER — Encounter: Payer: Self-pay | Admitting: Internal Medicine

## 2017-04-01 LAB — HM MAMMOGRAPHY

## 2017-04-06 ENCOUNTER — Encounter: Payer: Self-pay | Admitting: Internal Medicine

## 2017-04-19 ENCOUNTER — Ambulatory Visit: Payer: Medicare Other | Admitting: Internal Medicine

## 2017-04-19 ENCOUNTER — Encounter: Payer: Self-pay | Admitting: Internal Medicine

## 2017-04-19 VITALS — BP 124/78 | HR 76 | Temp 97.6°F | Resp 14 | Ht 66.0 in | Wt 191.4 lb

## 2017-04-19 DIAGNOSIS — R011 Cardiac murmur, unspecified: Secondary | ICD-10-CM | POA: Diagnosis not present

## 2017-04-19 DIAGNOSIS — L659 Nonscarring hair loss, unspecified: Secondary | ICD-10-CM

## 2017-04-19 DIAGNOSIS — E1140 Type 2 diabetes mellitus with diabetic neuropathy, unspecified: Secondary | ICD-10-CM | POA: Diagnosis not present

## 2017-04-19 DIAGNOSIS — I10 Essential (primary) hypertension: Secondary | ICD-10-CM | POA: Diagnosis not present

## 2017-04-19 LAB — BASIC METABOLIC PANEL
BUN: 15 mg/dL (ref 6–23)
CO2: 29 mEq/L (ref 19–32)
Calcium: 10.2 mg/dL (ref 8.4–10.5)
Chloride: 100 mEq/L (ref 96–112)
Creatinine, Ser: 0.7 mg/dL (ref 0.40–1.20)
GFR: 85.13 mL/min (ref >60.00)
Glucose, Bld: 89 mg/dL (ref 70–99)
Potassium: 4.7 mEq/L (ref 3.5–5.1)
Sodium: 136 mEq/L (ref 135–145)

## 2017-04-19 LAB — TSH: TSH: 1.78 u[IU]/mL (ref 0.35–4.50)

## 2017-04-19 LAB — HEMOGLOBIN A1C: Hgb A1c MFr Bld: 6.2 % (ref 4.6–6.5)

## 2017-04-19 NOTE — Patient Instructions (Addendum)
GO TO THE LAB : Get the blood work     GO TO THE FRONT DESK Schedule your next appointment for a  routine checkup in 6 months  

## 2017-04-19 NOTE — Progress Notes (Signed)
Pre visit review using our clinic review tool, if applicable. No additional management support is needed unless otherwise documented below in the visit note. 

## 2017-04-19 NOTE — Progress Notes (Signed)
Subjective:    Patient ID: Natalie [name], female    DOB: February 10, 1933, 83 y.o.   MRN: [id number]  DOS:  04/19/2017 Type of visit - description : rov Interval history: DM: Good compliance with medication. Continue with neuropathy symptoms and poor balance.  No recent falls DJD: On NSAIDs and hydrocodone. Alopecia: Wonders if a TSH could be check. Today and noticed a heart murmur, no previous history of, denies symptoms, see below   Review of Systems Chronic fatigue at baseline No chest pain, very seldom has palpitations Lower extremity edema at baseline No nausea, vomiting, diarrhea  Past Medical History:  Diagnosis Date  . Back pain, chronic   . BCC (basal cell carcinoma of skin)    several, nose , 2012 L forehead  . Degeneration of lumbar intervertebral disc    Dr. Nelva Bush  . Diabetes mellitus   . HTN (hypertension)   . Hyperlipidemia   . Hyperparathyroidism   . Lumbago    Lumbar epidural steroid injection at L5-S1 midline by Dr. Nelva Bush  . Pelvic pain in female 2005    back and pelvic pain, s/p w-u, CT (-), u/s showed a thick endometrium, s/p Bx per gyn  . Ureteric obstruction    hydronephrosis,sees urology    Past Surgical History:  Procedure Laterality Date  . CYSTECTOMY  1989   thyriod  . LOBECTOMY  1989   s/p  RML lung lobectomy, biopsy showed precancerous changes   . POLYPECTOMY     colon    Social History   Socioeconomic History  . Marital status: Married    Spouse name: Not on file  . Number of children: 4  . Years of education: Not on file  . Highest education level: Not on file  Social Needs  . Financial resource strain: Not on file  . Food insecurity - worry: Not on file  . Food insecurity - inability: Not on file  . Transportation needs - medical: Not on file  . Transportation needs - non-medical: Not on file  Occupational History  . Occupation: retired Firefighter: [employer]  Tobacco Use  . Smoking status:  Never Smoker  . Smokeless tobacco: Never Used  Substance and Sexual Activity  . Alcohol use: Yes    Comment: Rarely  . Drug use: No  . Sexual activity: No  Other Topics Concern  . Not on file  Social History Narrative   Lives w/ husband                Allergies as of 04/19/2017      Reactions   Cefixime    unknown   Cefprozil Nausea And Vomiting   Cefuroxime Axetil Nausea And Vomiting   Nitrofurantoin    unknown   Penicillins    unknown   Sulfonamide Derivatives    unknown      Medication List        Accurate as of 04/19/17  7:49 PM. Always use your most recent med list.          acetaminophen 500 MG tablet Commonly known as:  TYLENOL Take 1,250 mg by mouth every 6 (six) hours as needed for mild pain.   albuterol 108 (90 Base) MCG/ACT inhaler Commonly known as:  PROVENTIL HFA;VENTOLIN HFA Inhale 2 puffs into the lungs every 6 (six) hours as needed for wheezing.   amLODipine 5 MG tablet Commonly known as:  NORVASC Take 1 tablet (5 mg total) by mouth daily.  aspirin 81 MG tablet Take 81 mg by mouth daily. Reported on 05/30/2015   azelastine 0.1 % nasal spray Commonly known as:  ASTELIN Place 2 sprays into both nostrils 2 (two) times daily.   BIOTIN PO Take by mouth.   calcium carbonate 500 MG chewable tablet Commonly known as:  TUMS - dosed in mg elemental calcium Chew 1 tablet by mouth daily.   CINNAMON PO Take by mouth daily. Reported on 05/30/2015   EPIPEN 2-PAK 0.3 mg/0.3 mL Soaj injection Generic drug:  EPINEPHrine Reported on 11/27/2015   famotidine 20 MG tablet Commonly known as:  PEPCID Take 20 mg by mouth 2 (two) times daily as needed.   fluticasone 110 MCG/ACT inhaler Commonly known as:  FLOVENT HFA Inhale 1-2 puffs into the lungs 2 (two) times daily.   fluticasone 50 MCG/ACT nasal spray Commonly known as:  FLONASE Place 2 sprays into the nose daily.   furosemide 20 MG tablet Commonly known as:  LASIX Take 1.5 tablets (30 mg  total) by mouth daily.   glucose blood test strip Commonly known as:  ONE TOUCH ULTRA TEST Check blood sugar once daily as directed.   HYDROcodone-acetaminophen 10-325 MG tablet Commonly known as:  NORCO Take 1 tablet by mouth every 8 (eight) hours as needed.   lisinopril 20 MG tablet Commonly known as:  PRINIVIL,ZESTRIL Take 1 tablet (20 mg total) by mouth daily.   loratadine 10 MG tablet Commonly known as:  CLARITIN Take 10 mg by mouth daily.   MAALOX ANTI-GAS PO Take by mouth as needed. Any anti-gas medication[tablet as needed].   metFORMIN 500 MG tablet Commonly known as:  GLUCOPHAGE Take 1.5 tablets (750 mg total) by mouth 2 (two) times daily with a meal.   multivitamin,tx-minerals tablet Take 1 tablet by mouth daily.   naproxen sodium 220 MG tablet Commonly known as:  ALEVE Take 220 mg by mouth daily.   NON FORMULARY ALLERGY INJECTIONS.   ONETOUCH DELICA LANCETS FINE Misc use to TEST ONCE DAILY   tetrahydrozoline 0.05 % ophthalmic solution Place 2 drops into both eyes daily.          Objective:   Physical Exam BP 124/78 (BP Location: Left Arm, Patient Position: Sitting, Cuff Size: Normal)   Pulse 76   Temp 97.6 F (36.4 C) (Oral)   Resp 14   Ht 5' 6" (1.676 m)   Wt 191 lb 6 oz (86.8 kg)   SpO2 96%   BMI 30.89 kg/m  General:   Well developed, well nourished . NAD.  HEENT:  Normocephalic . Face symmetric, atraumatic Lungs:  CTA B Normal respiratory effort, no intercostal retractions, no accessory muscle use. Heart: RRR, soft systolic murmur.  No pretibial edema bilaterally  DIABETIC FEET EXAM: mild lower extremity edema Normal pedal pulses bilaterally Skin normal, nails normal, no calluses Pinprick examination: Patchy, distal decrease sensitivity Neurologic:  alert & oriented X3.  Speech normal, gait appropriate for age and unassisted Psych--  Cognition and judgment appear intact.  Cooperative with normal attention span and  concentration.  Behavior appropriate. No anxious or depressed appearing.      Assessment & Plan:   Assessment DM, complicated by neuropathy and callus HTN Hyperlipidemia, intolerant to medications , not checking labs Allergies, B-spasm, get shots----- sees Dr Donneta Romberg  Fatigue, chronic GU:  Hydronephrosis  dx ~ 2007, follow-up with serial USs. DX was right UPJ obstruction. Had a Lasix renogram 2013 ( R 46%, L 54%) Dr. Jasmine December, rx to RTC PRN.  [place] 11-2016 : no obstruction MSK: --DJD --Chronic back pain, dr Nelva Bush , he rx hydrocodone BCC H/o pelvic pain, 2005, w/u per gyn H/o hyperparathyroidism H/o RML Lobectomy, 1989, bx precancerous cells  PLAN DM: On metformin, check A1c.  Feet exam good except for neuropathy. HTN: On amlodipine, Lasix, lisinopril, well controlled, check a BMP Alopecia: Would like TSH done Heart murmur?  Heart murmur on exam, seems asx, EKG sinus rhythm, RBBB.  No change from previous. Check echo H/o  hydronephrosis: No obstruction per ultrasound 11-2016 DJD: On half OTC naproxen daily and hydrocodone.  Monitoring kidney function Had a Flu shot RTC 6 m

## 2017-04-19 NOTE — Assessment & Plan Note (Signed)
DM: On metformin, check A1c.  Feet exam good except for neuropathy. HTN: On amlodipine, Lasix, lisinopril, well controlled, check a BMP Alopecia: Would like TSH done Heart murmur?  Heart murmur on exam, seems asx, EKG sinus rhythm, RBBB.  No change from previous. Check echo H/o  hydronephrosis: No obstruction per ultrasound 11-2016 DJD: On half OTC naproxen daily and hydrocodone.  Monitoring kidney function Had a Flu shot RTC 6 m

## 2017-04-21 ENCOUNTER — Ambulatory Visit (INDEPENDENT_AMBULATORY_CARE_PROVIDER_SITE_OTHER): Payer: Medicare Other

## 2017-04-21 DIAGNOSIS — Z780 Asymptomatic menopausal state: Secondary | ICD-10-CM

## 2017-05-03 ENCOUNTER — Other Ambulatory Visit: Payer: Self-pay

## 2017-05-03 ENCOUNTER — Ambulatory Visit (HOSPITAL_COMMUNITY): Payer: Medicare Other | Attending: Internal Medicine

## 2017-05-03 DIAGNOSIS — R011 Cardiac murmur, unspecified: Secondary | ICD-10-CM

## 2017-05-03 DIAGNOSIS — I5030 Unspecified diastolic (congestive) heart failure: Secondary | ICD-10-CM | POA: Diagnosis not present

## 2017-05-18 ENCOUNTER — Other Ambulatory Visit: Payer: Self-pay

## 2017-05-18 MED ORDER — METFORMIN HCL 500 MG PO TABS: 750.0000 mg | ORAL_TABLET | Freq: Two times a day (BID) | ORAL | 1 refills | Status: DC

## 2017-05-30 LAB — HM DIABETES EYE EXAM

## 2017-07-07 ENCOUNTER — Other Ambulatory Visit: Payer: Self-pay | Admitting: Internal Medicine

## 2017-08-16 ENCOUNTER — Other Ambulatory Visit: Payer: Self-pay | Admitting: Internal Medicine

## 2017-08-23 ENCOUNTER — Other Ambulatory Visit: Payer: Self-pay | Admitting: Internal Medicine

## 2017-09-29 LAB — HM MAMMOGRAPHY

## 2017-10-04 ENCOUNTER — Encounter: Payer: Self-pay | Admitting: Internal Medicine

## 2017-10-11 ENCOUNTER — Encounter: Payer: Self-pay | Admitting: Internal Medicine

## 2017-10-12 ENCOUNTER — Encounter: Payer: Self-pay | Admitting: Internal Medicine

## 2017-10-12 ENCOUNTER — Ambulatory Visit (INDEPENDENT_AMBULATORY_CARE_PROVIDER_SITE_OTHER): Payer: Medicare Other | Admitting: Internal Medicine

## 2017-10-12 VITALS — BP 134/76 | HR 95 | Temp 97.5°F | Resp 14 | Ht 66.0 in | Wt 192.0 lb

## 2017-10-12 DIAGNOSIS — R5383 Other fatigue: Secondary | ICD-10-CM

## 2017-10-12 DIAGNOSIS — N390 Urinary tract infection, site not specified: Secondary | ICD-10-CM

## 2017-10-12 DIAGNOSIS — I10 Essential (primary) hypertension: Secondary | ICD-10-CM

## 2017-10-12 DIAGNOSIS — E119 Type 2 diabetes mellitus without complications: Secondary | ICD-10-CM

## 2017-10-12 DIAGNOSIS — E785 Hyperlipidemia, unspecified: Secondary | ICD-10-CM | POA: Diagnosis not present

## 2017-10-12 LAB — POC URINALSYSI DIPSTICK (AUTOMATED)
Bilirubin, UA: NEGATIVE
Blood, UA: NEGATIVE
Glucose, UA: POSITIVE — AB
Ketones, UA: NEGATIVE
Nitrite, UA: NEGATIVE
Protein, UA: NEGATIVE
Spec Grav, UA: 1.015 (ref 1.010–1.025)
Urobilinogen, UA: 0.2 E.U./dL
pH, UA: 6 (ref 5.0–8.0)

## 2017-10-12 MED ORDER — CIPROFLOXACIN HCL 500 MG PO TABS: 500.0000 mg | ORAL_TABLET | Freq: Two times a day (BID) | ORAL | 0 refills | Status: DC

## 2017-10-12 NOTE — Patient Instructions (Signed)
Drink plenty of fluids  Take the antibiotic for 3 days  Okay to continue cranberry juice  Call if not back to normal in a week or 2.

## 2017-10-12 NOTE — Progress Notes (Signed)
Subjective:    Patient ID: Natalie [name], female    DOB: 01/27/1933, 84 y.o.   MRN: [id number]  DOS:  10/12/2017 Type of visit - description : Acute visit Interval history: Symptoms started approximately 2 weeks ago: Some dysuria and described as itching around the urinary meatus. No urinary frequency. Denies any vaginal discharge, rash or bleeding. She suspects a UTI.  Has taking cranberry juice without much help  Review of Systems  Denies fever chills No nausea or vomiting No flank pain per se  Past Medical History:  Diagnosis Date  . Back pain, chronic   . BCC (basal cell carcinoma of skin)    several, nose , 2012 L forehead  . Degeneration of lumbar intervertebral disc    Dr. Nelva Bush  . Diabetes mellitus   . HTN (hypertension)   . Hyperlipidemia   . Hyperparathyroidism   . Lumbago    Lumbar epidural steroid injection at L5-S1 midline by Dr. Nelva Bush  . Pelvic pain in female 2005    back and pelvic pain, s/p w-u, CT (-), u/s showed a thick endometrium, s/p Bx per gyn  . Ureteric obstruction    hydronephrosis,sees urology    Past Surgical History:  Procedure Laterality Date  . CYSTECTOMY  1989   thyriod  . LOBECTOMY  1989   s/p  RML lung lobectomy, biopsy showed precancerous changes   . POLYPECTOMY     colon    Social History   Socioeconomic History  . Marital status: Married    Spouse name: Not on file  . Number of children: 4  . Years of education: Not on file  . Highest education level: Not on file  Occupational History  . 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 Financial resource strain: Not on file  . Food insecurity:    Worry: Not on file    Inability: Not on file  . Transportation needs:    Medical: Not on file    Non-medical: Not on file  Tobacco Use  . Smoking status: Never Smoker  . Smokeless tobacco: Never Used  Substance and Sexual Activity  . Alcohol use: Yes    Comment: Rarely    . Drug use: No  . Sexual activity: Never  Lifestyle  . Physical activity:    Days per week: Not on file    Minutes per session: Not on file  . Stress: Not on file  Relationships  . Social connections:    Talks on phone: Not on file    Gets together: Not on file    Attends religious service: Not on file    Active member of club or organization: Not on file    Attends meetings of clubs or organizations: Not on file    Relationship status: Not on file  . Intimate partner violence:    Fear of current or ex partner: Not on file    Emotionally abused: Not on file    Physically abused: Not on file    Forced sexual activity: Not on file  Other Topics Concern  . Not on file  Social History Narrative   Lives w/ husband                Allergies as of 10/12/2017      Reactions   Cefixime    unknown   Cefprozil Nausea And Vomiting   Cefuroxime Axetil Nausea And Vomiting   Nitrofurantoin  unknown   Penicillins    unknown   Sulfonamide Derivatives    unknown      Medication List        Accurate as of 10/12/17 11:59 PM. Always use your most recent med list.          acetaminophen 500 MG tablet Commonly known as:  TYLENOL Take 1,250 mg by mouth every 6 (six) hours as needed for mild pain.   albuterol 108 (90 Base) MCG/ACT inhaler Commonly known as:  PROVENTIL HFA;VENTOLIN HFA Inhale 2 puffs into the lungs every 6 (six) hours as needed for wheezing.   amLODipine 5 MG tablet Commonly known as:  NORVASC Take 1 tablet (5 mg total) by mouth daily.   aspirin 81 MG tablet Take 81 mg by mouth daily. Reported on 05/30/2015   azelastine 0.1 % nasal spray Commonly known as:  ASTELIN Place 2 sprays into both nostrils 2 (two) times daily.   BIOTIN PO Take by mouth.   calcium carbonate 500 MG chewable tablet Commonly known as:  TUMS - dosed in mg elemental calcium Chew 1 tablet by mouth daily.   CINNAMON PO Take by mouth daily. Reported on 05/30/2015   ciprofloxacin  500 MG tablet Commonly known as:  CIPRO Take 1 tablet (500 mg total) by mouth 2 (two) times daily.   EPIPEN 2-PAK 0.3 mg/0.3 mL Soaj injection Generic drug:  EPINEPHrine Reported on 11/27/2015   famotidine 20 MG tablet Commonly known as:  PEPCID Take 20 mg by mouth 2 (two) times daily as needed.   fluticasone 110 MCG/ACT inhaler Commonly known as:  FLOVENT HFA Inhale 1-2 puffs into the lungs 2 (two) times daily.   fluticasone 50 MCG/ACT nasal spray Commonly known as:  FLONASE Place 2 sprays into the nose daily.   furosemide 20 MG tablet Commonly known as:  LASIX Take 1.5 tablets (30 mg total) by mouth daily.   glucose blood test strip Commonly known as:  ONE TOUCH ULTRA TEST Check blood sugar once daily as directed.   HYDROcodone-acetaminophen 10-325 MG tablet Commonly known as:  NORCO Take 1 tablet by mouth every 8 (eight) hours as needed.   lisinopril 20 MG tablet Commonly known as:  PRINIVIL,ZESTRIL Take 1 tablet (20 mg total) by mouth daily.   loratadine 10 MG tablet Commonly known as:  CLARITIN Take 10 mg by mouth daily.   MAALOX ANTI-GAS PO Take by mouth as needed. Any anti-gas medication[tablet as needed].   metFORMIN 500 MG tablet Commonly known as:  GLUCOPHAGE Take 1.5 tablets (750 mg total) by mouth 2 (two) times daily with a meal.   multivitamin,tx-minerals tablet Take 1 tablet by mouth daily.   naproxen sodium 220 MG tablet Commonly known as:  ALEVE Take 220 mg by mouth daily.   NON FORMULARY ALLERGY INJECTIONS.   ONETOUCH DELICA LANCETS FINE Misc use to TEST ONCE DAILY   tetrahydrozoline 0.05 % ophthalmic solution Place 2 drops into both eyes daily.          Objective:   Physical Exam BP 134/76 (BP Location: Left Arm, Patient Position: Sitting, Cuff Size: Normal)   Pulse 95   Temp (!) 97.5 F (36.4 C) (Oral)   Resp 14   Ht _0  (1.676 m)   Wt 192 lb (87.1 kg)   SpO2 97%   BMI 30.99 kg/m  General:   Well developed, well  nourished . NAD.  HEENT:  Normocephalic . Face symmetric, atraumatic  Abdomen:  Not distended, soft, non-tender.  No rebound or rigidity.  No CVA tenderness Skin: Not pale. Not jaundice Neurologic:  alert & oriented X3.  Speech normal, gait appropriate for age and unassisted Psych--  Cognition and judgment appear intact.  Cooperative with normal attention span and concentration.  Behavior appropriate. No anxious or depressed appearing.     Assessment & Plan:    Assessment DM, complicated by neuropathy and callus HTN Hyperlipidemia, intolerant to medications , not checking labs Allergies, B-spasm, get shots----- sees Dr Donneta Romberg  Fatigue, chronic GU:  Hydronephrosis  dx ~ 2007, follow-up with serial USs. DX was right UPJ obstruction. Had a Lasix renogram 2013 ( R 46%, L 54%) Dr. Jasmine December, rx to RTC PRN.  [place] 11-2016 : no obstruction MSK: --DJD --Chronic back pain, dr Nelva Bush , he rx hydrocodone BCC H/o pelvic pain, 2005, w/u per gyn H/o hyperparathyroidism H/o RML Lobectomy, 1989, bx precancerous cells  PLAN UTI?  Symptoms suggest UTI, Udip with trace leukocytes.  We will get a UA, urine culture, start empiric Cipro.  Continue with pushing fluids and okay to take cranberry juice. Will let me know if not better.

## 2017-10-12 NOTE — Progress Notes (Signed)
Pre visit review using our clinic review tool, if applicable. No additional management support is needed unless otherwise documented below in the visit note. 

## 2017-10-13 LAB — URINALYSIS, ROUTINE W REFLEX MICROSCOPIC
Bilirubin Urine: NEGATIVE
Hgb urine dipstick: NEGATIVE
Ketones, ur: NEGATIVE
Nitrite: NEGATIVE
RBC / HPF: NONE SEEN (ref 0–?)
Specific Gravity, Urine: 1.01 (ref 1.000–1.030)
Total Protein, Urine: NEGATIVE
Urine Glucose: NEGATIVE
Urobilinogen, UA: 0.2 (ref 0.0–1.0)
pH: 5.5 (ref 5.0–8.0)

## 2017-10-13 NOTE — Assessment & Plan Note (Signed)
UTI?  Symptoms suggest UTI, Udip with trace leukocytes.  We will get a UA, urine culture, start empiric Cipro.  Continue with pushing fluids and okay to take cranberry juice. Will let me know if not better.

## 2017-10-14 ENCOUNTER — Encounter: Payer: Self-pay | Admitting: Internal Medicine

## 2017-10-14 ENCOUNTER — Ambulatory Visit: Payer: Medicare Other | Admitting: Internal Medicine

## 2017-10-14 VITALS — BP 136/80 | HR 86 | Temp 98.4°F | Resp 16 | Ht 66.0 in | Wt 192.0 lb

## 2017-10-14 DIAGNOSIS — N390 Urinary tract infection, site not specified: Secondary | ICD-10-CM

## 2017-10-14 DIAGNOSIS — R21 Rash and other nonspecific skin eruption: Secondary | ICD-10-CM

## 2017-10-14 LAB — URINE CULTURE
MICRO NUMBER:: 90646606
SPECIMEN QUALITY:: ADEQUATE

## 2017-10-14 MED ORDER — BETAMETHASONE DIPROPIONATE AUG 0.05 % EX CREA: TOPICAL_CREAM | Freq: Two times a day (BID) | CUTANEOUS | 0 refills | Status: DC

## 2017-10-14 NOTE — Progress Notes (Signed)
Pre visit review using our clinic review tool, if applicable. No additional management support is needed unless otherwise documented below in the visit note. 

## 2017-10-14 NOTE — Patient Instructions (Addendum)
Finish Cipro as prescribed  I do not think you are having a reaction  Use the cream twice a day  Avoid sun exposure  Okay to take Claritin OTC as needed for itching.  ER or call 911 if: Difficulty breathing, throat itching or swelling, the rash spreads or if you have fever.

## 2017-10-14 NOTE — Progress Notes (Signed)
Subjective:    Patient ID: Natalie [name], female    DOB: [birth year], 84 y.o.   MRN: [id number]  DOS:  10/14/2017 Type of visit - description : acute Interval history: Patient developed a rash (calls it "sunburn") and wonders if it is related to Cipro. Rash is located at the neck, very itchy, first noticed  on 10/09/2017 after she was outside exposed to the sun.  She uses short sleeves and did not have any "sunburn" on her arms. The rash got somewhat better. She took her first ciprofloxacin 10/12/2017 for a  UTI She is here because she feels the rash is again more noticeable  for the last 24 hours and wonders if is related to Cipro. . Review of Systems Subjective fever this morning but otherwise afebrile.  No chills No itching or burning anywhere else but on the rash. Denies any lip or tongue swelling. No wheezing or shortness of breath  Past Medical History:  Diagnosis Date  . Back pain, chronic   . BCC (basal cell carcinoma of skin)    several, nose , 2012 L forehead  . Degeneration of lumbar intervertebral disc    Dr. Nelva Bush  . Diabetes mellitus   . HTN (hypertension)   . Hyperlipidemia   . Hyperparathyroidism   . Lumbago    Lumbar epidural steroid injection at L5-S1 midline by Dr. Nelva Bush  . Pelvic pain in female 2005    back and pelvic pain, s/p w-u, CT (-), u/s showed a thick endometrium, s/p Bx per gyn  . Ureteric obstruction    hydronephrosis,sees urology    Past Surgical History:  Procedure Laterality Date  . CYSTECTOMY  1989   thyriod  . LOBECTOMY  1989   s/p  RML lung lobectomy, biopsy showed precancerous changes   . POLYPECTOMY     colon    Social History   Socioeconomic History  . Marital status: Married    Spouse name: Not on file  . Number of children: 4  . Years of education: Not on file  . Highest education level: Not on file  Occupational History  . 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  Financial resource strain: Not on file  . Food insecurity:    Worry: Not on file    Inability: Not on file  . Transportation needs:    Medical: Not on file    Non-medical: Not on file  Tobacco Use  . Smoking status: Never Smoker  . Smokeless tobacco: Never Used  Substance and Sexual Activity  . Alcohol use: Yes    Comment: Rarely  . Drug use: No  . Sexual activity: Never  Lifestyle  . Physical activity:    Days per week: Not on file    Minutes per session: Not on file  . Stress: Not on file  Relationships  . Social connections:    Talks on phone: Not on file    Gets together: Not on file    Attends religious service: Not on file    Active member of club or organization: Not on file    Attends meetings of clubs or organizations: Not on file    Relationship status: Not on file  . Intimate partner violence:    Fear of current or ex partner: Not on file    Emotionally abused: Not on file    Physically abused: Not on file    Forced sexual activity: Not on file  Other Topics Concern  . Not on file  Social History Narrative   Lives w/ husband                Allergies as of 10/14/2017      Reactions   Cefixime    unknown   Cefprozil Nausea And Vomiting   Cefuroxime Axetil Nausea And Vomiting   Nitrofurantoin    unknown   Penicillins    unknown   Sulfonamide Derivatives    unknown      Medication List        Accurate as of 10/14/17 11:59 PM. Always use your most recent med list.          acetaminophen 500 MG tablet Commonly known as:  TYLENOL Take 1,250 mg by mouth every 6 (six) hours as needed for mild pain.   albuterol 108 (90 Base) MCG/ACT inhaler Commonly known as:  PROVENTIL HFA;VENTOLIN HFA Inhale 2 puffs into the lungs every 6 (six) hours as needed for wheezing.   amLODipine 5 MG tablet Commonly known as:  NORVASC Take 1 tablet (5 mg total) by mouth daily.   aspirin 81 MG tablet Take 81 mg by mouth daily. Reported on 05/30/2015   augmented  betamethasone dipropionate 0.05 % cream Commonly known as:  DIPROLENE-AF Apply topically 2 (two) times daily.   azelastine 0.1 % nasal spray Commonly known as:  ASTELIN Place 2 sprays into both nostrils 2 (two) times daily.   BIOTIN PO Take by mouth.   calcium carbonate 500 MG chewable tablet Commonly known as:  TUMS - dosed in mg elemental calcium Chew 1 tablet by mouth daily.   CINNAMON PO Take by mouth daily. Reported on 05/30/2015   ciprofloxacin 500 MG tablet Commonly known as:  CIPRO Take 1 tablet (500 mg total) by mouth 2 (two) times daily.   EPIPEN 2-PAK 0.3 mg/0.3 mL Soaj injection Generic drug:  EPINEPHrine Reported on 11/27/2015   famotidine 20 MG tablet Commonly known as:  PEPCID Take 20 mg by mouth 2 (two) times daily as needed.   fluticasone 110 MCG/ACT inhaler Commonly known as:  FLOVENT HFA Inhale 1-2 puffs into the lungs 2 (two) times daily.   fluticasone 50 MCG/ACT nasal spray Commonly known as:  FLONASE Place 2 sprays into the nose daily.   furosemide 20 MG tablet Commonly known as:  LASIX Take 1.5 tablets (30 mg total) by mouth daily.   glucose blood test strip Commonly known as:  ONE TOUCH ULTRA TEST Check blood sugar once daily as directed.   HYDROcodone-acetaminophen 10-325 MG tablet Commonly known as:  NORCO Take 1 tablet by mouth every 8 (eight) hours as needed.   lisinopril 20 MG tablet Commonly known as:  PRINIVIL,ZESTRIL Take 1 tablet (20 mg total) by mouth daily.   loratadine 10 MG tablet Commonly known as:  CLARITIN Take 10 mg by mouth daily.   MAALOX ANTI-GAS PO Take by mouth as needed. Any anti-gas medication[tablet as needed].   metFORMIN 500 MG tablet Commonly known as:  GLUCOPHAGE Take 1.5 tablets (750 mg total) by mouth 2 (two) times daily with a meal.   multivitamin,tx-minerals tablet Take 1 tablet by mouth daily.   naproxen sodium 220 MG tablet Commonly known as:  ALEVE Take 220 mg by mouth daily.   NON  FORMULARY ALLERGY INJECTIONS.   ONETOUCH DELICA LANCETS FINE Misc use to TEST ONCE DAILY   tetrahydrozoline 0.05 % ophthalmic solution Place 2 drops into both eyes daily.  Objective:   Physical Exam BP 136/80 (BP Location: Left Arm, Patient Position: Sitting, Cuff Size: Small)   Pulse 86   Temp 98.4 F (36.9 C) (Oral)   Resp 16   Ht '5\' 6"'$  (1.676 m)   Wt 192 lb (87.1 kg)   SpO2 96%   BMI 30.99 kg/m  General:   Well developed, well nourished . NAD.  HEENT:  Normocephalic . Face symmetric, atraumatic Skin: See picture, has a erythematous rash at the neck, upper chest.  All other areas are not affected psych--  Cognition and judgment appear intact.  Cooperative with normal attention span and concentration.  Behavior appropriate. No anxious or depressed appearing.        Assessment & Plan:   Assessment DM, complicated by neuropathy and callus HTN Hyperlipidemia, intolerant to medications , not checking labs Allergies, B-spasm, get shots----- sees Dr Donneta Romberg  Fatigue, chronic GU:  Hydronephrosis  dx ~ 2007, follow-up with serial USs. DX was right UPJ obstruction. Had a Lasix renogram 2013 ( R 46%, L 54%) Dr. Jasmine December, rx to RTC PRN.  [place] 11-2016 : no obstruction MSK: --DJD --Chronic back pain, dr Nelva Bush , he rx hydrocodone BCC H/o pelvic pain, 2005, w/u per gyn H/o hyperparathyroidism H/o RML Lobectomy, 1989, bx precancerous cells  PLAN Rash: Started before she took Cipro, doubt an allergic reaction.  Although in a V  shape on the neck, I am not sure if this is a photo reaction because all other exposes areas are not involved.  Miliaria ? Recommend potent topical steroids, Claritin, avoid sun exposure.  ER if rash spreads or she has any mouth or throat itching or swelling. UTI: Seen 2 days ago with LUTS, Udip +, urine culture show E. coli, pansensitive.  She is allergic to cephalosporins, nitrofurantoin, penicillin and sulfa.  Continue Cipro

## 2017-10-16 NOTE — Assessment & Plan Note (Signed)
Rash: Started before she took Cipro, doubt an allergic reaction.  Although in a V  shape on the neck, I am not sure if this is a photo reaction because all other exposes areas are not involved.  Miliaria ? Recommend potent topical steroids, Claritin, avoid sun exposure.  ER if rash spreads or she has any mouth or throat itching or swelling. UTI: Seen 2 days ago with LUTS, Udip +, urine culture show E. coli, pansensitive.  She is allergic to cephalosporins, nitrofurantoin, penicillin and sulfa.  Continue Cipro

## 2017-10-18 ENCOUNTER — Encounter: Payer: Self-pay | Admitting: Internal Medicine

## 2017-10-18 ENCOUNTER — Ambulatory Visit: Payer: Medicare Other | Admitting: Internal Medicine

## 2017-10-18 VITALS — BP 126/74 | HR 88 | Temp 98.4°F | Resp 16 | Ht 66.0 in | Wt 187.5 lb

## 2017-10-18 DIAGNOSIS — H04129 Dry eye syndrome of unspecified lacrimal gland: Secondary | ICD-10-CM | POA: Diagnosis not present

## 2017-10-18 DIAGNOSIS — I10 Essential (primary) hypertension: Secondary | ICD-10-CM

## 2017-10-18 DIAGNOSIS — N390 Urinary tract infection, site not specified: Secondary | ICD-10-CM | POA: Diagnosis not present

## 2017-10-18 DIAGNOSIS — E1142 Type 2 diabetes mellitus with diabetic polyneuropathy: Secondary | ICD-10-CM | POA: Diagnosis not present

## 2017-10-18 LAB — AST: AST: 18 U/L (ref 0–37)

## 2017-10-18 LAB — BASIC METABOLIC PANEL
BUN: 18 mg/dL (ref 6–23)
CO2: 26 mEq/L (ref 19–32)
Calcium: 10.4 mg/dL (ref 8.4–10.5)
Chloride: 103 mEq/L (ref 96–112)
Creatinine, Ser: 0.65 mg/dL (ref 0.40–1.20)
GFR: 92.61 mL/min (ref >60.00)
Glucose, Bld: 95 mg/dL (ref 70–99)
Potassium: 4.7 mEq/L (ref 3.5–5.1)
Sodium: 137 mEq/L (ref 135–145)

## 2017-10-18 LAB — HEMOGLOBIN A1C: Hgb A1c MFr Bld: 6.2 % (ref 4.6–6.5)

## 2017-10-18 LAB — ALT: ALT: 11 U/L (ref 0–35)

## 2017-10-18 NOTE — Progress Notes (Signed)
Pre visit review using our clinic review tool, if applicable. No additional management support is needed unless otherwise documented below in the visit note. 

## 2017-10-18 NOTE — Progress Notes (Signed)
Subjective:    Patient ID: Natalie [name], female    DOB: 12-04-1932, 84 y.o.   MRN: [id number]  DOS:  10/18/2017 Type of visit - description : rov Interval history: DM: Good compliance with medication, due for an A1c HTN: Good med compliance, ambulatory BPs normal, one time it was slt low at  110. Reports  dry/red eye, using OTC vasoconstrictors.   Review of Systems Recently seen with a UTI, had antibiotics, symptoms decreased, has residual mils dysuri  but no gross hematuria . Has mild discomfort at the lower left back, related?.  Had diarrhea for couple of days, last loose stool yesterday.   patient thinks could be related to antibiotics Rash, see last visit, improving.  Past Medical History:  Diagnosis Date  . Back pain, chronic   . BCC (basal cell carcinoma of skin)    several, nose , 2012 L forehead  . Degeneration of lumbar intervertebral disc    Dr. Nelva Bush  . Diabetes mellitus   . HTN (hypertension)   . Hyperlipidemia   . Hyperparathyroidism   . Lumbago    Lumbar epidural steroid injection at L5-S1 midline by Dr. Nelva Bush  . Pelvic pain in female 2005    back and pelvic pain, s/p w-u, CT (-), u/s showed a thick endometrium, s/p Bx per gyn  . Ureteric obstruction    hydronephrosis,sees urology    Past Surgical History:  Procedure Laterality Date  . CYSTECTOMY  1989   thyriod  . LOBECTOMY  1989   s/p  RML lung lobectomy, biopsy showed precancerous changes   . POLYPECTOMY     colon    Social History   Socioeconomic History  . Marital status: Married    Spouse name: Not on file  . Number of children: 4  . Years of education: Not on file  . Highest education level: Not on file  Occupational History  . Occupation: retired Firefighter: [employer]  Social Needs  . Financial resource strain: Not on file  . Food insecurity:    Worry: Not on file    Inability: Not on file  . Transportation needs:    Medical: Not on file   Non-medical: Not on file  Tobacco Use  . Smoking status: Never Smoker  . Smokeless tobacco: Never Used  Substance and Sexual Activity  . Alcohol use: Yes    Comment: Rarely  . Drug use: No  . Sexual activity: Never  Lifestyle  . Physical activity:    Days per week: Not on file    Minutes per session: Not on file  . Stress: Not on file  Relationships  . Social connections:    Talks on phone: Not on file    Gets together: Not on file    Attends religious service: Not on file    Active member of club or organization: Not on file    Attends meetings of clubs or organizations: Not on file    Relationship status: Not on file  . Intimate partner violence:    Fear of current or ex partner: Not on file    Emotionally abused: Not on file    Physically abused: Not on file    Forced sexual activity: Not on file  Other Topics Concern  . Not on file  Social History Narrative   Lives w/ husband                Allergies as of 10/18/2017  Reactions   Cefixime    unknown   Cefprozil Nausea And Vomiting   Cefuroxime Axetil Nausea And Vomiting   Nitrofurantoin    unknown   Penicillins    unknown   Sulfonamide Derivatives    unknown      Medication List        Accurate as of 10/18/17 11:59 PM. Always use your most recent med list.          acetaminophen 500 MG tablet Commonly known as:  TYLENOL Take 1,250 mg by mouth every 6 (six) hours as needed for mild pain.   albuterol 108 (90 Base) MCG/ACT inhaler Commonly known as:  PROVENTIL HFA;VENTOLIN HFA Inhale 2 puffs into the lungs every 6 (six) hours as needed for wheezing.   amLODipine 5 MG tablet Commonly known as:  NORVASC Take 1 tablet (5 mg total) by mouth daily.   aspirin 81 MG tablet Take 81 mg by mouth daily. Reported on 05/30/2015   augmented betamethasone dipropionate 0.05 % cream Commonly known as:  DIPROLENE-AF Apply topically 2 (two) times daily.   azelastine 0.1 % nasal spray Commonly known as:   ASTELIN Place 2 sprays into both nostrils 2 (two) times daily.   BIOTIN PO Take by mouth.   calcium carbonate 500 MG chewable tablet Commonly known as:  TUMS - dosed in mg elemental calcium Chew 1 tablet by mouth daily.   CINNAMON PO Take by mouth daily. Reported on 05/30/2015   EPIPEN 2-PAK 0.3 mg/0.3 mL Soaj injection Generic drug:  EPINEPHrine Reported on 11/27/2015   famotidine 20 MG tablet Commonly known as:  PEPCID Take 20 mg by mouth 2 (two) times daily as needed.   fluticasone 110 MCG/ACT inhaler Commonly known as:  FLOVENT HFA Inhale 1-2 puffs into the lungs 2 (two) times daily.   fluticasone 50 MCG/ACT nasal spray Commonly known as:  FLONASE Place 2 sprays into the nose daily.   furosemide 20 MG tablet Commonly known as:  LASIX Take 1.5 tablets (30 mg total) by mouth daily.   glucose blood test strip Commonly known as:  ONE TOUCH ULTRA TEST Check blood sugar once daily as directed.   HYDROcodone-acetaminophen 10-325 MG tablet Commonly known as:  NORCO Take 1 tablet by mouth every 8 (eight) hours as needed.   lisinopril 20 MG tablet Commonly known as:  PRINIVIL,ZESTRIL Take 1 tablet (20 mg total) by mouth daily.   loratadine 10 MG tablet Commonly known as:  CLARITIN Take 10 mg by mouth daily.   MAALOX ANTI-GAS PO Take by mouth as needed. Any anti-gas medication[tablet as needed].   metFORMIN 500 MG tablet Commonly known as:  GLUCOPHAGE Take 1.5 tablets (750 mg total) by mouth 2 (two) times daily with a meal.   multivitamin,tx-minerals tablet Take 1 tablet by mouth daily.   naproxen sodium 220 MG tablet Commonly known as:  ALEVE Take 220 mg by mouth daily.   NON FORMULARY ALLERGY INJECTIONS.   ONETOUCH DELICA LANCETS FINE Misc use to TEST ONCE DAILY          Objective:   Physical Exam BP 126/74 (BP Location: Left Arm, Patient Position: Sitting, Cuff Size: Normal)   Pulse 88   Temp 98.4 F (36.9 C) (Oral)   Resp 16   Ht _0  (1.676  m)   Wt 187 lb 8 oz (85 kg)   SpO2 97%   BMI 30.26 kg/m  General:   Well developed, well nourished . NAD.  HEENT:  Normocephalic .  Face symmetric, atraumatic Lungs:  CTA B Normal respiratory effort, no intercostal retractions, no accessory muscle use. Heart: RRR, ?syst  murmur.  No pretibial edema bilaterally  Abdomen: Not distended, soft, nontender. Skin: Neck area rash, improved. Neurologic:  alert & oriented X3.  Speech normal, gait appropriate for age and unassisted Psych--  Cognition and judgment appear intact.  Cooperative with normal attention span and concentration.  Behavior appropriate. No anxious or depressed appearing.      Assessment & Plan:   Assessment DM, complicated by neuropathy and callus HTN Hyperlipidemia, intolerant to medications , not checking labs Allergies, B-spasm, get shots----- sees Dr Donneta Romberg  Fatigue, chronic GU:  Hydronephrosis  dx ~ 2007, follow-up with serial USs. DX was right UPJ obstruction. Had a Lasix renogram 2013 ( R 46%, L 54%) Dr. Jasmine December, rx to RTC PRN.  [place] 11-2016 : no obstruction MSK: --DJD --Chronic back pain, dr Nelva Bush , he rx hydrocodone BCC H/o pelvic pain, 2005, w/u per gyn H/o hyperparathyroidism H/o RML Lobectomy, 1989, bx precancerous cells ?  Heart murmur: Echocardiogram 04/2017 essentially normal  PLAN DM: Continue metformin, check A1c HTN: Continue amlodipine, Lasix, lisinopril.  Check a BMP, AST, ALT. Murmur?:  Echo was essentially normal few months ago UTI: Status post treatment, have a mild residual dysuria.  Recommend to call in 2 weeks if symptoms persist, will recheck a UA/UCX.  Having some diarrhea after the antibiotics, recommend probiotics, call if no better  Dry/red eyes: Recommend against using OTC vasoconstrictors, okay to use Systane RTC 4 to 5 months, CPX.

## 2017-10-18 NOTE — Patient Instructions (Signed)
GO TO THE LAB : Get the blood work     GO TO THE FRONT DESK Schedule your next appointment for a  yearly checkup in 4 to 5 months   keep yourself hydrated  Take probiotics such as align once daily for 2 to 3 weeks

## 2017-10-19 NOTE — Assessment & Plan Note (Signed)
PLAN DM: Continue metformin, check A1c HTN: Continue amlodipine, Lasix, lisinopril.  Check a BMP, AST, ALT. Murmur?:  Echo was essentially normal few months ago UTI: Status post treatment, have a mild residual dysuria.  Recommend to call in 2 weeks if symptoms persist, will recheck a UA/UCX.  Having some diarrhea after the antibiotics, recommend probiotics, call if no better  Dry/red eyes: Recommend against using OTC vasoconstrictors, okay to use Systane RTC 4 to 5 months, CPX.

## 2017-11-16 ENCOUNTER — Other Ambulatory Visit: Payer: Self-pay | Admitting: Internal Medicine

## 2018-01-23 ENCOUNTER — Other Ambulatory Visit: Payer: Self-pay | Admitting: Internal Medicine

## 2018-01-30 ENCOUNTER — Other Ambulatory Visit: Payer: Self-pay | Admitting: Internal Medicine

## 2018-02-09 ENCOUNTER — Emergency Department (HOSPITAL_BASED_OUTPATIENT_CLINIC_OR_DEPARTMENT_OTHER): Payer: Medicare Other

## 2018-02-09 ENCOUNTER — Telehealth: Payer: Self-pay

## 2018-02-09 ENCOUNTER — Emergency Department (HOSPITAL_BASED_OUTPATIENT_CLINIC_OR_DEPARTMENT_OTHER)
Admission: EM | Admit: 2018-02-09 | Discharge: 2018-02-09 | Disposition: A | Discharge status: Home / Self Care | Payer: Medicare Other | Attending: Emergency Medicine | Admitting: Emergency Medicine

## 2018-02-09 ENCOUNTER — Encounter (HOSPITAL_BASED_OUTPATIENT_CLINIC_OR_DEPARTMENT_OTHER): Payer: Self-pay | Admitting: *Deleted

## 2018-02-09 ENCOUNTER — Other Ambulatory Visit: Payer: Self-pay

## 2018-02-09 DIAGNOSIS — S0231XA Fracture of orbital floor, right side, initial encounter for closed fracture: Secondary | ICD-10-CM | POA: Diagnosis not present

## 2018-02-09 DIAGNOSIS — Y999 Unspecified external cause status: Secondary | ICD-10-CM | POA: Insufficient documentation

## 2018-02-09 DIAGNOSIS — W010XXA Fall on same level from slipping, tripping and stumbling without subsequent striking against object, initial encounter: Secondary | ICD-10-CM | POA: Diagnosis not present

## 2018-02-09 DIAGNOSIS — Y92002 Bathroom of unspecified non-institutional (private) residence single-family (private) house as the place of occurrence of the external cause: Secondary | ICD-10-CM | POA: Diagnosis not present

## 2018-02-09 DIAGNOSIS — Z7984 Long term (current) use of oral hypoglycemic drugs: Secondary | ICD-10-CM | POA: Diagnosis not present

## 2018-02-09 DIAGNOSIS — Z23 Encounter for immunization: Secondary | ICD-10-CM | POA: Diagnosis not present

## 2018-02-09 DIAGNOSIS — Z7982 Long term (current) use of aspirin: Secondary | ICD-10-CM | POA: Diagnosis not present

## 2018-02-09 DIAGNOSIS — E119 Type 2 diabetes mellitus without complications: Secondary | ICD-10-CM | POA: Diagnosis not present

## 2018-02-09 DIAGNOSIS — S0990XA Unspecified injury of head, initial encounter: Secondary | ICD-10-CM | POA: Diagnosis present

## 2018-02-09 DIAGNOSIS — I10 Essential (primary) hypertension: Secondary | ICD-10-CM | POA: Diagnosis not present

## 2018-02-09 DIAGNOSIS — Y929 Unspecified place or not applicable: Secondary | ICD-10-CM | POA: Diagnosis not present

## 2018-02-09 DIAGNOSIS — Z79899 Other long term (current) drug therapy: Secondary | ICD-10-CM | POA: Diagnosis not present

## 2018-02-09 DIAGNOSIS — Y939 Activity, unspecified: Secondary | ICD-10-CM | POA: Insufficient documentation

## 2018-02-09 DIAGNOSIS — W19XXXA Unspecified fall, initial encounter: Secondary | ICD-10-CM

## 2018-02-09 MED ORDER — TETANUS-DIPHTH-ACELL PERTUSSIS 5-2.5-18.5 LF-MCG/0.5 IM SUSP
0.5000 mL | Freq: Once | INTRAMUSCULAR | Status: AC
  Administered 2018-02-09: 0.5 mL via INTRAMUSCULAR
  Filled 2018-02-09: qty 0.5

## 2018-02-09 NOTE — Telephone Encounter (Signed)
Pt in ED from fall.

## 2018-02-09 NOTE — ED Provider Notes (Signed)
[place] EMERGENCY DEPARTMENT Provider Note   CSN: [id number] Arrival date & time: 02/09/18  1439     History   Chief Complaint Chief Complaint  Patient presents with  . Fall    HPI Natalie [name] is a 84 y.o. female.  84 yo F with a chief complaint of a fall.  The patient was walking from the bathroom back to her bedroom and she tripped on something.  She thinks maybe it was a wedge that there to hold the door open.  She landed on the right side of her body in the left side of her face.  Complaining mostly of pain to the right side of her face.  Having some pain to the left side of her neck.  She denies chest pain or shortness of breath.  She is a small break in the skin to the right forearm but denies bony tenderness.  She has chronic low back pain that is unchanged.  She has chronic left knee pain which is also unchanged.  The history is provided by the patient.  Fall  This is a new problem. The current episode started 1 to 2 hours ago. The problem occurs constantly. The problem has not changed since onset.Pertinent negatives include no chest pain, no headaches and no shortness of breath. Nothing aggravates the symptoms. Nothing relieves the symptoms. She has tried nothing for the symptoms. The treatment provided no relief.    Past Medical History:  Diagnosis Date  . Back pain, chronic   . BCC (basal cell carcinoma of skin)    several, nose , 2012 L forehead  . Degeneration of lumbar intervertebral disc    Dr. Nelva Bush  . Diabetes mellitus   . HTN (hypertension)   . Hyperlipidemia   . Hyperparathyroidism   . Lumbago    Lumbar epidural steroid injection at L5-S1 midline by Dr. Nelva Bush  . Pelvic pain in female 2005    back and pelvic pain, s/p w-u, CT (-), u/s showed a thick endometrium, s/p Bx per gyn  . Ureteric obstruction    hydronephrosis,sees urology    Patient Active Problem List   Diagnosis Date Noted  . Chronic left shoulder pain 10/05/2016  .  Multiple allergies 06/01/2016  . PCP NOTES >>>>>>>>>>>>>>>>>>> 11/27/2015  . Fatigue 03/19/2014  . Vertigo 09/19/2013  . Preop examination 02/05/2013  . Annual physical exam 02/05/2011  . DM (diabetes mellitus) type II controlled, neurological manifestation ([place]) 05/01/2008  . Dyslipidemia --intolerant to medications 05/01/2008  . Back pain, chronic-sees Dr.Ramos 10/11/2007  . HTN (hypertension) 11/30/2006  . S/P lobectomy of lung 11/30/2006  . OBSTRUCTION, URETERIC NEC 11/30/2006    Past Surgical History:  Procedure Laterality Date  . CYSTECTOMY  1989   thyriod  . LOBECTOMY  1989   s/p  RML lung lobectomy, biopsy showed precancerous changes   . POLYPECTOMY     colon     OB History    Gravida  4   Para  4   Term      Preterm      AB      Living  4     SAB      TAB      Ectopic      Multiple      Live Births               Home Medications    Prior to Admission medications   Medication Sig Start Date End Date Taking? Authorizing  Provider  acetaminophen (TYLENOL) 500 MG tablet Take 1,250 mg by mouth every 6 (six) hours as needed for mild pain.    [provider]  albuterol (PROVENTIL HFA;VENTOLIN HFA) 108 (90 BASE) MCG/ACT inhaler Inhale 2 puffs into the lungs every 6 (six) hours as needed for wheezing. 11/15/11 10/18/17  Colon Branch, MD  amLODipine (NORVASC) 5 MG tablet Take 1 tablet (5 mg total) by mouth daily. 01/23/18   Colon Branch, MD  aspirin 81 MG tablet Take 81 mg by mouth daily. Reported on 05/30/2015    [provider]  augmented betamethasone dipropionate (DIPROLENE-AF) 0.05 % cream Apply topically 2 (two) times daily. 10/14/17   Colon Branch, MD  azelastine (ASTELIN) 0.1 % nasal spray Place 2 sprays into both nostrils 2 (two) times daily. 11/09/16   Colon Branch, MD  BIOTIN PO Take by mouth.    [provider]  calcium carbonate (TUMS - DOSED IN MG ELEMENTAL CALCIUM) 500 MG chewable tablet Chew 1 tablet by mouth daily.     [provider]  CINNAMON PO Take by mouth daily. Reported on 05/30/2015    [provider]  EPIPEN 2-PAK 0.3 MG/0.3ML SOAJ injection Reported on 11/27/2015 03/26/13   [provider]  famotidine (PEPCID) 20 MG tablet Take 20 mg by mouth 2 (two) times daily as needed.     [provider]  fluticasone (FLONASE) 50 MCG/ACT nasal spray Place 2 sprays into the nose daily.    [provider]  fluticasone (FLOVENT HFA) 110 MCG/ACT inhaler Inhale 1-2 puffs into the lungs 2 (two) times daily.    [provider]  furosemide (LASIX) 20 MG tablet Take 1.5 tablets (30 mg total) by mouth daily. 08/17/17   Colon Branch, MD  glucose blood (ONE TOUCH ULTRA TEST) test strip TEST ONCE DAILY 01/30/18   Colon Branch, MD  HYDROcodone-acetaminophen [facility]) 10-325 MG per tablet Take 1 tablet by mouth every 8 (eight) hours as needed.     [provider]  lisinopril (PRINIVIL,ZESTRIL) 20 MG tablet Take 1 tablet (20 mg total) by mouth daily. 08/23/17   Colon Branch, MD  loratadine (CLARITIN) 10 MG tablet Take 10 mg by mouth daily.      [provider]  metFORMIN (GLUCOPHAGE) 500 MG tablet Take 1.5 tablets (750 mg total) by mouth 2 (two) times daily with a meal. 11/16/17   Colon Branch, MD  Multiple Vitamins-Minerals (MULTIVITAMIN,TX-MINERALS) tablet Take 1 tablet by mouth daily.      [provider]  naproxen sodium (ANAPROX) 220 MG tablet Take 220 mg by mouth daily.     [provider]  NON FORMULARY ALLERGY INJECTIONS.     [provider]  [facility] DELICA LANCETS FINE MISC use to TEST ONCE DAILY 01/05/13   Colon Branch, MD  Simethicone (MAALOX ANTI-GAS PO) Take by mouth as needed. Any anti-gas medication[tablet as needed].    [provider]    Family History Family History  Problem Relation Age of Onset  . Diabetes Brother        X 2  . Hypertension Brother   . Hypertension Mother   . Brain cancer Mother        tumor  .  CAD Father        MI at 49  . Breast cancer Neg Hx   . Colon cancer Neg Hx     Social History Social History   Tobacco Use  .  Smoking status: Never Smoker  . Smokeless tobacco: Never Used  Substance Use Topics  . Alcohol use: Yes    Comment: Rarely  . Drug use: No     Allergies   Cefixime; Cefprozil; Cefuroxime axetil; Nitrofurantoin; Penicillins; and Sulfonamide derivatives   Review of Systems Review of Systems  Constitutional: Negative for chills and fever.  HENT: Negative for congestion and rhinorrhea.   Eyes: Negative for redness and visual disturbance.  Respiratory: Negative for shortness of breath and wheezing.   Cardiovascular: Negative for chest pain and palpitations.  Gastrointestinal: Negative for nausea and vomiting.  Genitourinary: Negative for dysuria and urgency.  Musculoskeletal: Positive for arthralgias. Negative for myalgias.  Skin: Positive for wound. Negative for pallor.  Neurological: Negative for dizziness and headaches.     Physical Exam Updated Vital Signs BP (!) 150/70 (BP Location: Left Arm)   Pulse 84   Temp 98.8 F (37.1 C) (Oral)   Resp 20   Ht 5\' 5"  (1.651 m)   Wt 86.2 kg   SpO2 98%   BMI 31.62 kg/m   Physical Exam  Constitutional: She is oriented to person, place, and time. She appears well-developed and well-nourished. No distress.  HENT:  Head: Normocephalic.  Multiple superficial scratches with some avulsed skin to the right zygomatic arch.  No orbital rim tenderness.  She has a small superficial laceration to the bridge of the nose.  Dried blood at the right naris, no nasal septal hematoma.   Extraocular motor intact  Eyes: Pupils are equal, round, and reactive to light. EOM are normal.  Neck: Normal range of motion. Neck supple.  Cardiovascular: Normal rate and regular rhythm. Exam reveals no gallop and no friction rub.  No murmur heard. Pulmonary/Chest: Effort normal. She has no wheezes. She has no rales.  Abdominal:  Soft. She exhibits no distension. There is no tenderness.  Musculoskeletal: She exhibits no edema or tenderness.  Small skin tear with avulsion to the right forearm.  Neurological: She is alert and oriented to person, place, and time.  Skin: Skin is warm and dry. She is not diaphoretic.  Psychiatric: She has a normal mood and affect. Her behavior is normal.  Nursing note and vitals reviewed.    ED Treatments / Results  Labs (all labs ordered are listed, but only abnormal results are displayed) Labs Reviewed - No data to display  EKG None  Radiology Ct Head Wo Contrast  Result Date: 02/09/2018 CLINICAL DATA:  Head trauma, minor. Right-sided facial abrasion. Initial encounter. EXAM: CT HEAD WITHOUT CONTRAST CT MAXILLOFACIAL WITHOUT CONTRAST CT CERVICAL SPINE WITHOUT CONTRAST TECHNIQUE: Multidetector CT imaging of the head, cervical spine, and maxillofacial structures were performed using the standard protocol without intravenous contrast. Multiplanar CT image reconstructions of the cervical spine and maxillofacial structures were also generated. COMPARISON:  Brain MRI 09/17/2013 FINDINGS: CT HEAD FINDINGS Brain: No evidence of acute infarction, hemorrhage, hydrocephalus, extra-axial collection or mass lesion/mass effect. Generalized atrophy Vascular: Atherosclerotic calcification Skull: Hyperostosis interna.  Negative for calvarial fracture CT MAXILLOFACIAL FINDINGS Osseous: Blowout fracture of the right orbital floor, traversing the infraorbital canal with fat herniation and mild descent and distortion of the right inferior rectus. No orbital hematoma Orbits: Blowout fracture as described above. Sinuses: Right maxillary hemosinus related to the above. Left mastoid opacification, also seen by MRI in 2015. Soft tissues: History of facial abrasion.  No opaque foreign body. CT CERVICAL SPINE FINDINGS Alignment: Mild C6-7 anterolisthesis. Skull base and vertebrae: Negative for fracture Soft tissues  and  spinal canal: No prevertebral fluid or swelling. No visible canal hematoma. Disc levels: Generalized spondylosis and degenerative facet spurring. No evidence of canal or foraminal impingement. Upper chest: No evidence of injury. 15 mm left upper lobe smooth nodule. IMPRESSION: 1. Blowout fracture of the right orbital floor with fat herniation and inferior rectus deformity. 2. No evidence of intracranial injury or cervical spine fracture. 3. 15 mm left upper lobe pulmonary nodule. In retrospect, this is stable on chest x-rays dating back to 2016 and thus considered benign. Electronically Signed   By: Monte Fantasia M.D.   On: 02/09/2018 16:12   Ct Cervical Spine Wo Contrast  Result Date: 02/09/2018 CLINICAL DATA:  Head trauma, minor. Right-sided facial abrasion. Initial encounter. EXAM: CT HEAD WITHOUT CONTRAST CT MAXILLOFACIAL WITHOUT CONTRAST CT CERVICAL SPINE WITHOUT CONTRAST TECHNIQUE: Multidetector CT imaging of the head, cervical spine, and maxillofacial structures were performed using the standard protocol without intravenous contrast. Multiplanar CT image reconstructions of the cervical spine and maxillofacial structures were also generated. COMPARISON:  Brain MRI 09/17/2013 FINDINGS: CT HEAD FINDINGS Brain: No evidence of acute infarction, hemorrhage, hydrocephalus, extra-axial collection or mass lesion/mass effect. Generalized atrophy Vascular: Atherosclerotic calcification Skull: Hyperostosis interna.  Negative for calvarial fracture CT MAXILLOFACIAL FINDINGS Osseous: Blowout fracture of the right orbital floor, traversing the infraorbital canal with fat herniation and mild descent and distortion of the right inferior rectus. No orbital hematoma Orbits: Blowout fracture as described above. Sinuses: Right maxillary hemosinus related to the above. Left mastoid opacification, also seen by MRI in 2015. Soft tissues: History of facial abrasion.  No opaque foreign body. CT CERVICAL SPINE FINDINGS  Alignment: Mild C6-7 anterolisthesis. Skull base and vertebrae: Negative for fracture Soft tissues and spinal canal: No prevertebral fluid or swelling. No visible canal hematoma. Disc levels: Generalized spondylosis and degenerative facet spurring. No evidence of canal or foraminal impingement. Upper chest: No evidence of injury. 15 mm left upper lobe smooth nodule. IMPRESSION: 1. Blowout fracture of the right orbital floor with fat herniation and inferior rectus deformity. 2. No evidence of intracranial injury or cervical spine fracture. 3. 15 mm left upper lobe pulmonary nodule. In retrospect, this is stable on chest x-rays dating back to 2016 and thus considered benign. Electronically Signed   By: Monte Fantasia M.D.   On: 02/09/2018 16:12   Ct Maxillofacial Wo Contrast  Result Date: 02/09/2018 CLINICAL DATA:  Head trauma, minor. Right-sided facial abrasion. Initial encounter. EXAM: CT HEAD WITHOUT CONTRAST CT MAXILLOFACIAL WITHOUT CONTRAST CT CERVICAL SPINE WITHOUT CONTRAST TECHNIQUE: Multidetector CT imaging of the head, cervical spine, and maxillofacial structures were performed using the standard protocol without intravenous contrast. Multiplanar CT image reconstructions of the cervical spine and maxillofacial structures were also generated. COMPARISON:  Brain MRI 09/17/2013 FINDINGS: CT HEAD FINDINGS Brain: No evidence of acute infarction, hemorrhage, hydrocephalus, extra-axial collection or mass lesion/mass effect. Generalized atrophy Vascular: Atherosclerotic calcification Skull: Hyperostosis interna.  Negative for calvarial fracture CT MAXILLOFACIAL FINDINGS Osseous: Blowout fracture of the right orbital floor, traversing the infraorbital canal with fat herniation and mild descent and distortion of the right inferior rectus. No orbital hematoma Orbits: Blowout fracture as described above. Sinuses: Right maxillary hemosinus related to the above. Left mastoid opacification, also seen by MRI in 2015.  Soft tissues: History of facial abrasion.  No opaque foreign body. CT CERVICAL SPINE FINDINGS Alignment: Mild C6-7 anterolisthesis. Skull base and vertebrae: Negative for fracture Soft tissues and spinal canal: No prevertebral fluid or swelling. No visible canal hematoma. Disc levels:  Generalized spondylosis and degenerative facet spurring. No evidence of canal or foraminal impingement. Upper chest: No evidence of injury. 15 mm left upper lobe smooth nodule. IMPRESSION: 1. Blowout fracture of the right orbital floor with fat herniation and inferior rectus deformity. 2. No evidence of intracranial injury or cervical spine fracture. 3. 15 mm left upper lobe pulmonary nodule. In retrospect, this is stable on chest x-rays dating back to 2016 and thus considered benign. Electronically Signed   By: Monte Fantasia M.D.   On: 02/09/2018 16:12    Procedures Procedures (including critical care time)  Medications Ordered in ED Medications  Tdap (BOOSTRIX) injection 0.5 mL (0.5 mLs Intramuscular Given 02/09/18 1601)     Initial Impression / Assessment and Plan / ED Course  I have reviewed the triage vital signs and the nursing notes.  Pertinent labs & imaging results that were available during my care of the patient were reviewed by me and considered in my medical decision making (see chart for details).     84 yo F with a chief complaint of a fall.  Sounds mechanical by history.  She struck her face and is complaining of a headache and neck pain.  Will CT the head face and neck.  CT scan with no intracranial abnormality.  She does have a right orbital floor fracture.  Extraocular motor was intact.  She has no diplopia.  She has an ophthalmologist that she sees.  We will have her call them in the morning.  4:31 PM:  I have discussed the diagnosis/risks/treatment options with the patient and family and believe the pt to be eligible for discharge home to follow-up with PCP, optho. We also discussed returning  to the ED immediately if new or worsening sx occur. We discussed the sx which are most concerning (e.g., sudden worsening pain, fever, inability to tolerate by mouth) that necessitate immediate return. Medications administered to the patient during their visit and any new prescriptions provided to the patient are listed below.  Medications given during this visit Medications  Tdap (BOOSTRIX) injection 0.5 mL (0.5 mLs Intramuscular Given 02/09/18 1601)      The patient appears reasonably screen and/or stabilized for discharge and I doubt any other medical condition or other [facility] requiring further screening, evaluation, or treatment in the ED at this time prior to discharge.      Final Clinical Impressions(s) / ED Diagnoses   Final diagnoses:  Fall, initial encounter  Closed fracture of right orbital floor, initial encounter [facility])    ED Discharge Orders    None       Deno Etienne, DO 02/09/18 1631

## 2018-02-09 NOTE — Telephone Encounter (Signed)
Copied from [place] [phone]. Topic: General - Other >> Feb 09, 2018  1:12 PM Yvette Rack wrote: Reason for CRM: Pt husband Jeneen Rinks called in stating he needs Dr. Larose Kells nurse to return his call. Cb# [phone]

## 2018-02-09 NOTE — Telephone Encounter (Signed)
Tried calling Pt's husband Jeneen Rinks, no answer, unable to leave message.

## 2018-02-09 NOTE — ED Triage Notes (Addendum)
Pt c/o fall from standing landing on carpet x 2 hrs ago abrasion to right side of face and right arm skin tear, denies head injury, no blood thinners

## 2018-02-15 ENCOUNTER — Ambulatory Visit: Payer: Medicare Other | Admitting: Internal Medicine

## 2018-02-15 ENCOUNTER — Encounter: Payer: Self-pay | Admitting: Internal Medicine

## 2018-02-15 VITALS — BP 142/78 | HR 73 | Temp 97.9°F | Resp 16 | Ht 66.0 in | Wt 180.2 lb

## 2018-02-15 DIAGNOSIS — W19XXXD Unspecified fall, subsequent encounter: Secondary | ICD-10-CM | POA: Diagnosis not present

## 2018-02-15 DIAGNOSIS — S0231XD Fracture of orbital floor, right side, subsequent encounter for fracture with routine healing: Secondary | ICD-10-CM

## 2018-02-15 NOTE — Progress Notes (Signed)
Pre visit review using our clinic review tool, if applicable. No additional management support is needed unless otherwise documented below in the visit note. 

## 2018-02-15 NOTE — Progress Notes (Signed)
 Subjective:    Patient ID: Natalie [name], female    DOB: 11/11/1932, 84 y.o.   MRN: [id number]  DOS:  02/15/2018 Type of visit - description : ER f/u Interval history:  Went to the ER 02/09/2018, she had a mechanical fall, main symptom after the fall was R side face pain.  CT of the face, head and neck: Fracture at the right orbital floor with fat herniation. No intracranial injury or cervical/spine fracture. She also had a pulmonary nodule incidentally found, stable since x-ray in 2016.  Benign.   Review of Systems Since she left the ER she is doing better. Pain and swelling of the right face is decreased. Vision is normal, denies diplopia. She developed other aches and pains after the fall  including the right wrist, right hip and right shoulder: They are improving gradually. Also, has noted postnasal dripping , the "mucus" pools in the throat and she  brins  it up on and off, initially was bloody, now seems like "dark blood".  Denies any fever, chills. Had a headache after the incident but she is doing better. No URI symptoms, no sore throat. Denies any recent ear discharge or ear bleeding.  Past Medical History:  Diagnosis Date  . Back pain, chronic   . BCC (basal cell carcinoma of skin)    several, nose , 2012 L forehead  . Degeneration of lumbar intervertebral disc    Dr. Ramos  . Diabetes mellitus   . HTN (hypertension)   . Hyperlipidemia   . Hyperparathyroidism   . Lumbago    Lumbar epidural steroid injection at L5-S1 midline by Dr. Ramos  . Pelvic pain in female 2005    back and pelvic pain, s/p w-u, CT (-), u/s showed a thick endometrium, s/p Bx per gyn  . Ureteric obstruction    hydronephrosis,sees urology    Past Surgical History:  Procedure Laterality Date  . CYSTECTOMY  1989   thyriod  . LOBECTOMY  1989   s/p  RML lung lobectomy, biopsy showed precancerous changes   . POLYPECTOMY     colon    Social History   Socioeconomic History  . Marital  status: Married    Spouse name: Not on file  . Number of children: 4  . Years of education: Not on file  . Highest education level: Not on file  Occupational History  . Occupation: retired school assistant    Employer: [employer]  Social Needs  . Financial resource strain: Not on file  . Food insecurity:    Worry: Not on file    Inability: Not on file  . Transportation needs:    Medical: Not on file    Non-medical: Not on file  Tobacco Use  . Smoking status: Never Smoker  . Smokeless tobacco: Never Used  Substance and Sexual Activity  . Alcohol use: Yes    Comment: Rarely  . Drug use: No  . Sexual activity: Not Currently  Lifestyle  . Physical activity:    Days per week: Not on file    Minutes per session: Not on file  . Stress: Not on file  Relationships  . Social connections:    Talks on phone: Not on file    Gets together: Not on file    Attends religious service: Not on file    Active member of club or organization: Not on file    Attends meetings of clubs or organizations: Not on file    Relationship   status: Not on file  . Intimate partner violence:    Fear of current or ex partner: Not on file    Emotionally abused: Not on file    Physically abused: Not on file    Forced sexual activity: Not on file  Other Topics Concern  . Not on file  Social History Narrative   Lives w/ husband                Allergies as of 02/15/2018      Reactions   Cefixime    unknown   Cefprozil Nausea And Vomiting   Cefuroxime Axetil Nausea And Vomiting   Nitrofurantoin    unknown   Penicillins    unknown   Sulfonamide Derivatives    unknown      Medication List        Accurate as of 02/15/18 11:59 PM. Always use your most recent med list.          acetaminophen 500 MG tablet Commonly known as:  TYLENOL Take 1,250 mg by mouth every 6 (six) hours as needed for mild pain.   albuterol 108 (90 Base) MCG/ACT inhaler Commonly known as:  PROVENTIL  HFA;VENTOLIN HFA Inhale 2 puffs into the lungs every 6 (six) hours as needed for wheezing.   amLODipine 5 MG tablet Commonly known as:  NORVASC Take 1 tablet (5 mg total) by mouth daily.   aspirin 81 MG tablet Take 81 mg by mouth daily. Reported on 05/30/2015   augmented betamethasone dipropionate 0.05 % cream Commonly known as:  DIPROLENE-AF Apply topically 2 (two) times daily.   azelastine 0.1 % nasal spray Commonly known as:  ASTELIN Place 2 sprays into both nostrils 2 (two) times daily.   BIOTIN PO Take by mouth.   calcium carbonate 500 MG chewable tablet Commonly known as:  TUMS - dosed in mg elemental calcium Chew 1 tablet by mouth daily.   CINNAMON PO Take by mouth daily. Reported on 05/30/2015   EPIPEN 2-PAK 0.3 mg/0.3 mL Soaj injection Generic drug:  EPINEPHrine Reported on 11/27/2015   famotidine 20 MG tablet Commonly known as:  PEPCID Take 20 mg by mouth 2 (two) times daily as needed.   fluticasone 110 MCG/ACT inhaler Commonly known as:  FLOVENT HFA Inhale 1-2 puffs into the lungs 2 (two) times daily.   fluticasone 50 MCG/ACT nasal spray Commonly known as:  FLONASE Place 2 sprays into the nose daily.   furosemide 20 MG tablet Commonly known as:  LASIX Take 1.5 tablets (30 mg total) by mouth daily.   glucose blood test strip TEST ONCE DAILY   HYDROcodone-acetaminophen 10-325 MG tablet Commonly known as:  NORCO Take 1 tablet by mouth every 8 (eight) hours as needed.   lisinopril 20 MG tablet Commonly known as:  PRINIVIL,ZESTRIL Take 1 tablet (20 mg total) by mouth daily.   loratadine 10 MG tablet Commonly known as:  CLARITIN Take 10 mg by mouth daily.   MAALOX ANTI-GAS PO Take by mouth as needed. Any anti-gas medication[tablet as needed].   metFORMIN 500 MG tablet Commonly known as:  GLUCOPHAGE Take 1.5 tablets (750 mg total) by mouth 2 (two) times daily with a meal.   multivitamin,tx-minerals tablet Take 1 tablet by mouth daily.     naproxen sodium 220 MG tablet Commonly known as:  ALEVE Take 220 mg by mouth daily.   NON FORMULARY ALLERGY INJECTIONS.   ONETOUCH DELICA LANCETS FINE Misc use to TEST ONCE DAILY  Objective:   Physical Exam BP (!) 142/78 (BP Location: Left Arm, Patient Position: Sitting, Cuff Size: Normal)   Pulse 73   Temp 97.9 F (36.6 C) (Oral)   Resp 16   Ht 5' 6" (1.676 m)   Wt 180 lb 4 oz (81.8 kg)   SpO2 98%   BMI 29.09 kg/m  General:   Well developed, NAD, see BMI.  HEENT:  Normocephalic .  EOMI, no pain elicited. Palpation of the orbits elicits pain at the right lower orbit.  No deformities.  Some swelling.  Few excoriations on the right cheek noted. Nose w/ no acute bleed Lungs:  CTA B Normal respiratory effort, no intercostal retractions, no accessory muscle use. Heart: RRR,  no murmur.  No pretibial edema bilaterally  Skin: Not pale. Not jaundice Neurologic:  alert & oriented X3.  Speech normal, gait appropriate for age, assisted by a cane.  She seems a steady. Psych--  Cognition and judgment appear intact.  Cooperative with normal attention span and concentration.  Behavior appropriate. No anxious or depressed appearing.      Assessment & Plan:   Assessment DM, complicated by neuropathy and callus HTN Hyperlipidemia, intolerant to medications , not checking labs Allergies, B-spasm, get shots----- sees Dr Donneta Romberg  Fatigue, chronic GU:  Hydronephrosis  dx ~ 2007, follow-up with serial USs. DX was right UPJ obstruction. Had a Lasix renogram 2013 ( R 46%, L 54%) Dr. Jasmine December, rx to RTC PRN.  [place] 11-2016 : no obstruction MSK: --DJD --Chronic back pain, dr Nelva Bush , he rx hydrocodone BCC H/o pelvic pain, 2005, w/u per gyn H/o hyperparathyroidism H/o RML Lobectomy, 1989, bx precancerous cells ?  Heart murmur: Echocardiogram 04/2017 essentially normal  PLAN Status post fall, right orbit fracture: She seems to be improving clinically, fall prevention  discussed, continue using a cane, her son likes PT referral however the patient states that she already has a referral in and she plans to call. I am somewhat concerned about the bloody postnasal dripping, refer to ENT, I will ask them to see her within a week. She knows to call me if she has fever, chills or severe headache. Otherwise follow-up in approximately 10 days for his her CPX.

## 2018-02-15 NOTE — Patient Instructions (Signed)
We are referring you to a ENT doctor  If you notice severe headache, fever, chills, increase postnasal dripping: Please call immediately.

## 2018-02-16 ENCOUNTER — Telehealth: Payer: Self-pay | Admitting: Internal Medicine

## 2018-02-16 NOTE — Assessment & Plan Note (Signed)
Status post fall, right orbit fracture: She seems to be improving clinically, fall prevention discussed, continue using a cane, her son likes PT referral however the patient states that she already has a referral in and she plans to call. I am somewhat concerned about the bloody postnasal dripping, refer to ENT, I will ask them to see her within a week. She knows to call me if she has fever, chills or severe headache. Otherwise follow-up in approximately 10 days for his her CPX.

## 2018-02-16 NOTE — Telephone Encounter (Signed)
Copied from [place] [id number]. Topic: General - Other >> Feb 16, 2018  8:22 AM Lennox Solders wrote: Reason for CRM: pt was seen on 02/15/18 and per AVS to call md if she has fever/chills or bad headache. Pt is calling to let md know she felt hot last night. Pt did not take her temp. Pt also had chills. Please advise

## 2018-02-16 NOTE — Telephone Encounter (Signed)
Spoke w/ Pt- informed of recommendations. Pt verbalized understanding.  

## 2018-02-16 NOTE — Telephone Encounter (Signed)
Okay, continue with observation for now, recommend to get at thermometer  and document temperature.

## 2018-02-21 ENCOUNTER — Other Ambulatory Visit: Payer: Self-pay | Admitting: Internal Medicine

## 2018-02-24 DIAGNOSIS — S0231XD Fracture of orbital floor, right side, subsequent encounter for fracture with routine healing: Secondary | ICD-10-CM | POA: Insufficient documentation

## 2018-02-28 ENCOUNTER — Encounter: Payer: Self-pay | Admitting: Internal Medicine

## 2018-02-28 ENCOUNTER — Ambulatory Visit (INDEPENDENT_AMBULATORY_CARE_PROVIDER_SITE_OTHER): Payer: Medicare Other | Admitting: Internal Medicine

## 2018-02-28 VITALS — BP 128/84 | HR 88 | Temp 98.0°F | Resp 16 | Ht 66.0 in | Wt 182.2 lb

## 2018-02-28 DIAGNOSIS — I10 Essential (primary) hypertension: Secondary | ICD-10-CM | POA: Diagnosis not present

## 2018-02-28 DIAGNOSIS — Z23 Encounter for immunization: Secondary | ICD-10-CM

## 2018-02-28 DIAGNOSIS — Z0000 Encounter for general adult medical examination without abnormal findings: Secondary | ICD-10-CM

## 2018-02-28 DIAGNOSIS — E1140 Type 2 diabetes mellitus with diabetic neuropathy, unspecified: Secondary | ICD-10-CM

## 2018-02-28 LAB — CBC WITH DIFFERENTIAL/PLATELET
Basophils Absolute: 0.1 10*3/uL (ref 0.0–0.1)
Basophils Relative: 0.9 % (ref 0.0–3.0)
Eosinophils Absolute: 0.2 10*3/uL (ref 0.0–0.7)
Eosinophils Relative: 2.4 % (ref 0.0–5.0)
HCT: 37.1 % (ref 36.0–46.0)
Hemoglobin: 12.4 g/dL (ref 12.0–15.0)
Lymphocytes Relative: 34.6 % (ref 12.0–46.0)
Lymphs Abs: 2.7 10*3/uL (ref 0.7–4.0)
MCHC: 33.5 g/dL (ref 30.0–36.0)
MCV: 87.5 fl (ref 78.0–100.0)
Monocytes Absolute: 0.9 10*3/uL (ref 0.1–1.0)
Monocytes Relative: 11.1 % (ref 3.0–12.0)
Neutro Abs: 4 10*3/uL (ref 1.4–7.7)
Neutrophils Relative %: 51 % (ref 43.0–77.0)
Platelets: 295 10*3/uL (ref 150.0–400.0)
RBC: 4.23 Mil/uL (ref 3.87–5.11)
RDW: 14.1 % (ref 11.5–15.5)
WBC: 7.9 10*3/uL (ref 4.0–10.5)

## 2018-02-28 LAB — BASIC METABOLIC PANEL
BUN: 21 mg/dL (ref 6–23)
CO2: 27 mEq/L (ref 19–32)
Calcium: 10 mg/dL (ref 8.4–10.5)
Chloride: 100 mEq/L (ref 96–112)
Creatinine, Ser: 0.77 mg/dL (ref 0.40–1.20)
GFR: 76.1 mL/min (ref >60.00)
Glucose, Bld: 86 mg/dL (ref 70–99)
Potassium: 4.3 mEq/L (ref 3.5–5.1)
Sodium: 135 mEq/L (ref 135–145)

## 2018-02-28 MED ORDER — ZOSTER VAC RECOMB ADJUVANTED 50 MCG/0.5ML IM SUSR: 0.5000 mL | Freq: Once | INTRAMUSCULAR | 1 refills | Status: AC

## 2018-02-28 NOTE — Patient Instructions (Addendum)
GO TO THE LAB : Get the blood work     GO TO THE FRONT DESK Schedule your next appointment for a checkup in 4 to 6 months  Consider Shingrix , see RX  Take calcium, vitamin D.  Use consistently a cane or a walker

## 2018-02-28 NOTE — Assessment & Plan Note (Addendum)
-  Td 2019;  pneumonia shot 04-2005 , 2013;   prevnar 2013;  zostavax 2011; shingrex RX provided again -Female care PAPS: no further screening.   MMG 09/2017 , + ca, re-check planned in 6 months, pt aware  DEXA 11-2006 , 12-2008 and 02-2013 -----> normal.  Per guidelines no need for a dexa this year, consider one in few years if still a candidate . Continue ca and vit D CCS: last colonoscopy 7-11,  pros-cons discussed, elected not colonoscopy this year, will assess again 2021 but most likely she will decline. - U/S 05-2011 neg for AAA -Fall prevention: Encourage the use of a walker for cane consistently -Labs: BMP, CBC

## 2018-02-28 NOTE — Progress Notes (Signed)
Subjective:    Patient ID: Natalie [name], female    DOB: [birth year], 84 y.o.   MRN: [id number]  DOS:  02/28/2018 Type of visit - description : cpx Interval history: Since the last office visit, feels slightly better. Had a orbital fracture, still having some pain. Saw ENT. No other falls Has noticed some edema mostly when he forgets to take Lasix Occasionally has some nausea in the afternoon, no vomiting, no change in the color of the stools or abdominal pain. Symptoms typically decreased after she eats something.  Review of Systems  Other than above, a 14 point review of systems is negative    Past Medical History:  Diagnosis Date  . Back pain, chronic   . BCC (basal cell carcinoma of skin)    several, nose , 2012 L forehead  . Degeneration of lumbar intervertebral disc    Dr. Nelva Bush  . Diabetes mellitus   . HTN (hypertension)   . Hyperlipidemia   . Hyperparathyroidism   . Lumbago    Lumbar epidural steroid injection at L5-S1 midline by Dr. Nelva Bush  . Pelvic pain in female 2005    back and pelvic pain, s/p w-u, CT (-), u/s showed a thick endometrium, s/p Bx per gyn  . Ureteric obstruction    hydronephrosis,sees urology    Past Surgical History:  Procedure Laterality Date  . CYSTECTOMY  1989   thyriod  . LOBECTOMY  1989   s/p  RML lung lobectomy, biopsy showed precancerous changes   . POLYPECTOMY     colon    Social History   Socioeconomic History  . Marital status: Married    Spouse name: Not on file  . Number of children: 4  . Years of education: Not on file  . Highest education level: Not on file  Occupational History  . Occupation: retired Firefighter: [employer]  Social Needs  . Financial resource strain: Not on file  . Food insecurity:    Worry: Not on file    Inability: Not on file  . Transportation needs:    Medical: Not on file    Non-medical: Not on file  Tobacco Use  . Smoking status: Never Smoker  .  Smokeless tobacco: Never Used  Substance and Sexual Activity  . Alcohol use: Yes    Comment: Rarely  . Drug use: No  . Sexual activity: Not Currently  Lifestyle  . Physical activity:    Days per week: Not on file    Minutes per session: Not on file  . Stress: Not on file  Relationships  . Social connections:    Talks on phone: Not on file    Gets together: Not on file    Attends religious service: Not on file    Active member of club or organization: Not on file    Attends meetings of clubs or organizations: Not on file    Relationship status: Not on file  . Intimate partner violence:    Fear of current or ex partner: Not on file    Emotionally abused: Not on file    Physically abused: Not on file    Forced sexual activity: Not on file  Other Topics Concern  . Not on file  Social History Narrative   Lives w/ husband               Family History  Problem Relation Age of Onset  . Diabetes Brother  X 2  . Hypertension Brother   . Hypertension Mother   . Brain cancer Mother        tumor  . CAD Father        MI at 15  . Breast cancer Neg Hx   . Colon cancer Neg Hx      Allergies as of 02/28/2018      Reactions   Cefixime    unknown   Cefprozil Nausea And Vomiting   Cefuroxime Axetil Nausea And Vomiting   Nitrofurantoin    unknown   Penicillins    unknown   Sulfonamide Derivatives    unknown      Medication List        Accurate as of 02/28/18 11:59 PM. Always use your most recent med list.          acetaminophen 500 MG tablet Commonly known as:  TYLENOL Take 1,250 mg by mouth every 6 (six) hours as needed for mild pain.   albuterol 108 (90 Base) MCG/ACT inhaler Commonly known as:  PROVENTIL HFA;VENTOLIN HFA Inhale 2 puffs into the lungs every 6 (six) hours as needed for wheezing.   amLODipine 5 MG tablet Commonly known as:  NORVASC Take 1 tablet (5 mg total) by mouth daily.   aspirin 81 MG tablet Take 81 mg by mouth daily. Reported on  05/30/2015   augmented betamethasone dipropionate 0.05 % cream Commonly known as:  DIPROLENE-AF Apply topically 2 (two) times daily.   azelastine 0.1 % nasal spray Commonly known as:  ASTELIN Place 2 sprays into both nostrils 2 (two) times daily.   BIOTIN PO Take by mouth.   calcium carbonate 500 MG chewable tablet Commonly known as:  TUMS - dosed in mg elemental calcium Chew 1 tablet by mouth daily.   CINNAMON PO Take by mouth daily. Reported on 05/30/2015   EPIPEN 2-PAK 0.3 mg/0.3 mL Soaj injection Generic drug:  EPINEPHrine Reported on 11/27/2015   famotidine 20 MG tablet Commonly known as:  PEPCID Take 20 mg by mouth 2 (two) times daily as needed.   fluticasone 110 MCG/ACT inhaler Commonly known as:  FLOVENT HFA Inhale 1-2 puffs into the lungs 2 (two) times daily.   fluticasone 50 MCG/ACT nasal spray Commonly known as:  FLONASE Place 2 sprays into the nose daily.   furosemide 20 MG tablet Commonly known as:  LASIX Take 1.5 tablets (30 mg total) by mouth daily.   glucose blood test strip TEST ONCE DAILY   HYDROcodone-acetaminophen 10-325 MG tablet Commonly known as:  NORCO Take 1 tablet by mouth every 8 (eight) hours as needed.   lisinopril 20 MG tablet Commonly known as:  PRINIVIL,ZESTRIL Take 1 tablet (20 mg total) by mouth daily.   loratadine 10 MG tablet Commonly known as:  CLARITIN Take 10 mg by mouth daily.   MAALOX ANTI-GAS PO Take by mouth as needed. Any anti-gas medication[tablet as needed].   metFORMIN 500 MG tablet Commonly known as:  GLUCOPHAGE Take 1.5 tablets (750 mg total) by mouth 2 (two) times daily with a meal.   multivitamin,tx-minerals tablet Take 1 tablet by mouth daily.   naproxen sodium 220 MG tablet Commonly known as:  ALEVE Take 220 mg by mouth daily.   NON FORMULARY ALLERGY INJECTIONS.   ONETOUCH DELICA LANCETS FINE Misc use to TEST ONCE DAILY   Zoster Vaccine Adjuvanted injection Commonly known as:   SHINGRIX Inject 0.5 mLs into the muscle once for 1 dose.  Objective:   Physical Exam BP 128/84 (BP Location: Left Arm, Patient Position: Sitting, Cuff Size: Normal)   Pulse 88   Temp 98 F (36.7 C) (Oral)   Resp 16   Ht _0  (1.676 m)   Wt 182 lb 4 oz (82.7 kg)   SpO2 98%   BMI 29.42 kg/m  General: Well developed, NAD, see BMI.  Neck: No  thyromegaly  HEENT:  Normocephalic . Face symmetric, right side much less swollen today.   Lungs:  CTA B Normal respiratory effort, no intercostal retractions, no accessory muscle use. Heart: RRR,  no murmur.  +/+++pretibial edema bilaterally  Abdomen:  Not distended, soft, non-tender. No rebound or rigidity.   Skin: Exposed areas without rash. Not pale. Not jaundice Neurologic:  alert & oriented X3.  Speech normal, gait: Hesitant, needs help transferring.  Not using a walker or a cane today Strength symmetric and appropriate for age.  Psych: Cognition and judgment appear intact.  Cooperative with normal attention span and concentration.  Behavior appropriate. No anxious or depressed appearing.     Assessment & Plan:   Assessment DM, complicated by neuropathy and callus HTN Hyperlipidemia, intolerant to medications , not checking labs Allergies, B-spasm, get shots----- sees Dr Donneta Romberg  Fatigue, chronic GU:  Hydronephrosis  dx ~ 2007, follow-up with serial USs. DX was right UPJ obstruction. Had a Lasix renogram 2013 ( R 46%, L 54%) Dr. Jasmine December, rx to RTC PRN.  [place] 11-2016 : no obstruction MSK: --DJD --Chronic back pain, dr Nelva Bush , he rx hydrocodone BCC H/o pelvic pain, 2005, w/u per gyn H/o hyperparathyroidism H/o RML Lobectomy, 1989, bx precancerous cells ?  Heart murmur: Echocardiogram 04/2017 essentially normal  PLAN DM: Currently on metformin, last A1c satisfactory, recheck on RTC. HTN: Currently on amlodipine, Lasix, lisinopril.  Check a BMP and CBC Allergies: Per Dr. Donneta Romberg DJD, chronic back pain: Takes  half naproxen twice a day with food, no GI symptoms except mild nausea in the afternoon.  For now no change. Recent fall, orbital fracture: See last visit, she had some nosebleeds, that quickly resolved.  Subsequently saw ENT, plans to see them again in 2 weeks.  They noticed left-sided HOH. RTC 4 to 6 months

## 2018-02-28 NOTE — Progress Notes (Signed)
Pre visit review using our clinic review tool, if applicable. No additional management support is needed unless otherwise documented below in the visit note. 

## 2018-03-01 NOTE — Assessment & Plan Note (Signed)
DM: Currently on metformin, last A1c satisfactory, recheck on RTC. HTN: Currently on amlodipine, Lasix, lisinopril.  Check a BMP and CBC Allergies: Per Dr. Donneta Romberg DJD, chronic back pain: Takes half naproxen twice a day with food, no GI symptoms except mild nausea in the afternoon.  For now no change. Recent fall, orbital fracture: See last visit, she had some nosebleeds, that quickly resolved.  Subsequently saw ENT, plans to see them again in 2 weeks.  They noticed left-sided HOH. RTC 4 to 6 months

## 2018-03-02 ENCOUNTER — Telehealth: Payer: Self-pay | Admitting: *Deleted

## 2018-03-02 NOTE — Telephone Encounter (Signed)
Received Physician Orders from [place]; forwarded to provider/SLS 10/17

## 2018-03-07 NOTE — Telephone Encounter (Signed)
Orders signed and faxed to [facility] at [phone]. Orders sent for scanning.

## 2018-04-18 ENCOUNTER — Other Ambulatory Visit: Payer: Self-pay | Admitting: Internal Medicine

## 2018-04-18 LAB — HM MAMMOGRAPHY

## 2018-05-01 ENCOUNTER — Encounter: Payer: Self-pay | Admitting: Internal Medicine

## 2018-05-19 ENCOUNTER — Other Ambulatory Visit: Payer: Self-pay | Admitting: Internal Medicine

## 2018-06-20 ENCOUNTER — Ambulatory Visit: Payer: Medicare Other | Admitting: Internal Medicine

## 2018-06-20 ENCOUNTER — Encounter: Payer: Self-pay | Admitting: Internal Medicine

## 2018-06-20 VITALS — BP 128/82 | HR 81 | Temp 98.1°F | Resp 16 | Ht 66.0 in | Wt 182.1 lb

## 2018-06-20 DIAGNOSIS — N390 Urinary tract infection, site not specified: Secondary | ICD-10-CM

## 2018-06-20 DIAGNOSIS — I10 Essential (primary) hypertension: Secondary | ICD-10-CM

## 2018-06-20 DIAGNOSIS — R399 Unspecified symptoms and signs involving the genitourinary system: Secondary | ICD-10-CM | POA: Diagnosis not present

## 2018-06-20 LAB — POC URINALSYSI DIPSTICK (AUTOMATED)
Bilirubin, UA: NEGATIVE
Blood, UA: NEGATIVE
Glucose, UA: NEGATIVE
Ketones, UA: NEGATIVE
Leukocytes, UA: NEGATIVE
Nitrite, UA: NEGATIVE
Protein, UA: NEGATIVE
Spec Grav, UA: 1.015 (ref 1.010–1.025)
Urobilinogen, UA: 0.2 E.U./dL
pH, UA: 6 (ref 5.0–8.0)

## 2018-06-20 LAB — URINALYSIS, ROUTINE W REFLEX MICROSCOPIC
Bilirubin Urine: NEGATIVE
Hgb urine dipstick: NEGATIVE
Ketones, ur: NEGATIVE
Leukocytes, UA: NEGATIVE
Nitrite: NEGATIVE
RBC / HPF: NONE SEEN (ref 0–?)
Specific Gravity, Urine: 1.015 (ref 1.000–1.030)
Total Protein, Urine: NEGATIVE
Urine Glucose: NEGATIVE
Urobilinogen, UA: 0.2 (ref 0.0–1.0)
pH: 6.5 (ref 5.0–8.0)

## 2018-06-20 MED ORDER — FUROSEMIDE 20 MG PO TABS: 20.0000 mg | ORAL_TABLET | Freq: Every day | ORAL | 1 refills | Status: DC

## 2018-06-20 NOTE — Progress Notes (Signed)
Pre visit review using our clinic review tool, if applicable. No additional management support is needed unless otherwise documented below in the visit note. 

## 2018-06-20 NOTE — Patient Instructions (Addendum)
Drink plenty of fluids, call if not gradually better, call if worse

## 2018-06-20 NOTE — Progress Notes (Signed)
Subjective:    Patient ID: Natalie [name], female    DOB: 04-03-1933, 85 y.o.   MRN: [id number]  DOS:  06/20/2018 Type of visit - description: acute  Few weeks ago, developed dysuria, difficulty urinating and lower abdominal discomfort.  On 05/16/2018 went to urgent care, urine culture showed E. coli, was prescribed Macrobid.  Also Diflucan but she does not think she took it. Symptoms decreased but mild dysuria resurfaced this week.  No other symptoms, see ROS.  Also, 2 weeks ago saw a couple of red spots on the right foot, they are now dark.  Patient concerned.  Review of Systems Denies fever chills No nausea or vomiting.  No flank pain Denies any vaginal rash or irritation No further lower abdominal pain.  Past Medical History:  Diagnosis Date  . Back pain, chronic   . BCC (basal cell carcinoma of skin)    several, nose , 2012 L forehead  . Degeneration of lumbar intervertebral disc    Dr. Nelva Bush  . Diabetes mellitus   . HTN (hypertension)   . Hyperlipidemia   . Hyperparathyroidism   . Lumbago    Lumbar epidural steroid injection at L5-S1 midline by Dr. Nelva Bush  . Pelvic pain in female 2005    back and pelvic pain, s/p w-u, CT (-), u/s showed a thick endometrium, s/p Bx per gyn  . Ureteric obstruction    hydronephrosis,sees urology    Past Surgical History:  Procedure Laterality Date  . CYSTECTOMY  1989   thyriod  . LOBECTOMY  1989   s/p  RML lung lobectomy, biopsy showed precancerous changes   . POLYPECTOMY     colon    Social History   Socioeconomic History  . Marital status: Married    Spouse name: Not on file  . Number of children: 4  . Years of education: Not on file  . Highest education level: Not on file  Occupational History  . Occupation: retired Firefighter: [employer]  Social Needs  . Financial resource strain: Not on file  . Food insecurity:    Worry: Not on file    Inability: Not on file  . Transportation  needs:    Medical: Not on file    Non-medical: Not on file  Tobacco Use  . Smoking status: Never Smoker  . Smokeless tobacco: Never Used  Substance and Sexual Activity  . Alcohol use: Yes    Comment: Rarely  . Drug use: No  . Sexual activity: Not Currently  Lifestyle  . Physical activity:    Days per week: Not on file    Minutes per session: Not on file  . Stress: Not on file  Relationships  . Social connections:    Talks on phone: Not on file    Gets together: Not on file    Attends religious service: Not on file    Active member of club or organization: Not on file    Attends meetings of clubs or organizations: Not on file    Relationship status: Not on file  . Intimate partner violence:    Fear of current or ex partner: Not on file    Emotionally abused: Not on file    Physically abused: Not on file    Forced sexual activity: Not on file  Other Topics Concern  . Not on file  Social History Narrative   Lives w/ husband  Allergies as of 06/20/2018      Reactions   Cefixime    unknown   Cefprozil Nausea And Vomiting   Cefuroxime Axetil Nausea And Vomiting   Nitrofurantoin    unknown   Penicillins    unknown   Sulfonamide Derivatives    unknown      Medication List       Accurate as of June 20, 2018 11:59 PM. Always use your most recent med list.        acetaminophen 500 MG tablet Commonly known as:  TYLENOL Take 1,250 mg by mouth every 6 (six) hours as needed for mild pain.   albuterol 108 (90 Base) MCG/ACT inhaler Commonly known as:  PROVENTIL HFA;VENTOLIN HFA Inhale 2 puffs into the lungs every 6 (six) hours as needed for wheezing.   amLODipine 5 MG tablet Commonly known as:  NORVASC Take 1 tablet (5 mg total) by mouth daily.   aspirin 81 MG tablet Take 81 mg by mouth daily. Reported on 05/30/2015   augmented betamethasone dipropionate 0.05 % cream Commonly known as:  DIPROLENE-AF Apply topically 2 (two) times daily.     azelastine 0.1 % nasal spray Commonly known as:  ASTELIN Place 2 sprays into both nostrils 2 (two) times daily.   BIOTIN PO Take by mouth.   calcium carbonate 500 MG chewable tablet Commonly known as:  TUMS - dosed in mg elemental calcium Chew 1 tablet by mouth daily.   CINNAMON PO Take by mouth daily. Reported on 05/30/2015   EPIPEN 2-PAK 0.3 mg/0.3 mL Soaj injection Generic drug:  EPINEPHrine Reported on 11/27/2015   famotidine 20 MG tablet Commonly known as:  PEPCID Take 20 mg by mouth 2 (two) times daily as needed.   fluticasone 110 MCG/ACT inhaler Commonly known as:  FLOVENT HFA Inhale 1-2 puffs into the lungs 2 (two) times daily.   fluticasone 50 MCG/ACT nasal spray Commonly known as:  FLONASE Place 2 sprays into the nose daily.   furosemide 20 MG tablet Commonly known as:  LASIX Take 1 tablet (20 mg total) by mouth daily.   glucose blood test strip Commonly known as:  ONE TOUCH ULTRA TEST TEST ONCE DAILY   HYDROcodone-acetaminophen 10-325 MG tablet Commonly known as:  NORCO Take 1 tablet by mouth every 8 (eight) hours as needed.   lisinopril 20 MG tablet Commonly known as:  PRINIVIL,ZESTRIL Take 1 tablet (20 mg total) by mouth daily.   loratadine 10 MG tablet Commonly known as:  CLARITIN Take 10 mg by mouth daily.   MAALOX ANTI-GAS PO Take by mouth as needed. Any anti-gas medication[tablet as needed].   metFORMIN 500 MG tablet Commonly known as:  GLUCOPHAGE Take 1.5 tablets (750 mg total) by mouth 2 (two) times daily with a meal.   multivitamin,tx-minerals tablet Take 1 tablet by mouth daily.   naproxen sodium 220 MG tablet Commonly known as:  ALEVE Take 220 mg by mouth daily.   NON FORMULARY ALLERGY INJECTIONS.   ONETOUCH DELICA LANCETS FINE Misc use to TEST ONCE DAILY   tobramycin-dexamethasone ophthalmic solution Commonly known as:  TOBRADEX Place 1 drop into both eyes 4 (four) times daily.           Objective:   Physical  Exam Musculoskeletal:       Feet:    BP 128/82 (BP Location: Left Arm, Patient Position: Sitting, Cuff Size: Normal)   Pulse 81   Temp 98.1 F (36.7 C) (Oral)   Resp 16   Ht  5' 6" (1.676 m)   Wt 182 lb 2 oz (82.6 kg)   SpO2 97%   BMI 29.40 kg/m      General:   Well developed, NAD, BMI noted.  HEENT:  Normocephalic . Face symmetric, atraumatic Abdomen:  Not distended, soft, non-tender. No rebound or rigidity.   Skin: Not pale. Not jaundice Neurologic:  alert & oriented X3.  Speech normal, gait appropriate for age and unassisted Psych--  Cognition and judgment appear intact.  Cooperative with normal attention span and concentration.  Behavior appropriate. No anxious or depressed appearing.  Assess ment     Assessment DM, complicated by neuropathy and callus HTN Hyperlipidemia, intolerant to medications , not checking labs Allergies, B-spasm, get shots----- sees Dr Donneta Romberg  Fatigue, chronic GU:  Hydronephrosis  dx ~ 2007, follow-up with serial USs. DX was right UPJ obstruction. Had a Lasix renogram 2013 ( R 46%, L 54%) Dr. Jasmine December, rx to RTC PRN.  [place] 11-2016 : no obstruction MSK: --DJD --Chronic back pain, dr Nelva Bush , he rx hydrocodone BCC H/o pelvic pain, 2005, w/u per gyn H/o hyperparathyroidism H/o RML Lobectomy, 1989, bx precancerous cells ?  Heart murmur: Echocardiogram 04/2017 essentially normal  PLAN UTI: Documented UTI to E. coli, multi-sensitive, status post Macrobid.  Symptoms have resurfaced to some extent, just mild dysuria.  U dip negative, will send a UA, urine culture.  Treated with antibiotics if appropriate otherwise push fluids and call if not improving. Foot lesion: Has 2 nonspecific scabs, patient was concerned about the diabetic foot infection.  I do not think is the case, recommend observation for now. HTN: Reports that if she takes Lasix 1.5 tablets is too much, "cannot make it to the bathroom".  She is taking only 1 tablet daily.  BP today  is okay.  RF sent RTC already scheduled in few months.

## 2018-06-21 LAB — URINE CULTURE
MICRO NUMBER:: 147851
Result:: NO GROWTH
SPECIMEN QUALITY:: ADEQUATE

## 2018-06-21 NOTE — Assessment & Plan Note (Signed)
UTI: Documented UTI to E. coli, multi-sensitive, status post Macrobid.  Symptoms have resurfaced to some extent, just mild dysuria.  U dip negative, will send a UA, urine culture.  Treated with antibiotics if appropriate otherwise push fluids and call if not improving. Foot lesion: Has 2 nonspecific scabs, patient was concerned about the diabetic foot infection.  I do not think is the case, recommend observation for now. HTN: Reports that if she takes Lasix 1.5 tablets is too much, "cannot make it to the bathroom".  She is taking only 1 tablet daily.  BP today is okay.  RF sent RTC already scheduled in few months.

## 2018-06-26 ENCOUNTER — Encounter: Payer: Self-pay | Admitting: Internal Medicine

## 2018-06-28 LAB — HM DIABETES EYE EXAM

## 2018-07-03 ENCOUNTER — Encounter: Payer: Self-pay | Admitting: Internal Medicine

## 2018-07-19 ENCOUNTER — Other Ambulatory Visit: Payer: Self-pay | Admitting: Internal Medicine

## 2018-07-30 ENCOUNTER — Other Ambulatory Visit: Payer: Self-pay | Admitting: Internal Medicine

## 2018-08-01 ENCOUNTER — Ambulatory Visit: Payer: Medicare Other | Admitting: Internal Medicine

## 2018-08-22 ENCOUNTER — Other Ambulatory Visit: Payer: Self-pay

## 2018-08-22 ENCOUNTER — Ambulatory Visit (INDEPENDENT_AMBULATORY_CARE_PROVIDER_SITE_OTHER): Payer: Medicare Other | Admitting: Internal Medicine

## 2018-08-22 DIAGNOSIS — E1140 Type 2 diabetes mellitus with diabetic neuropathy, unspecified: Secondary | ICD-10-CM

## 2018-08-22 DIAGNOSIS — I10 Essential (primary) hypertension: Secondary | ICD-10-CM | POA: Diagnosis not present

## 2018-08-22 DIAGNOSIS — T7840XD Allergy, unspecified, subsequent encounter: Secondary | ICD-10-CM

## 2018-08-22 NOTE — Progress Notes (Signed)
Subjective:    Patient ID: Natalie [name], female    DOB: 02/05/1933, 85 y.o.   MRN: [id number]  DOS:  08/22/2018 Type of visit - description: Virtual Visit via Telephone Note  I connected with@ on 08/22/18 at 11:00 AM EDT by telephone and verified that I am speaking with the correct person using two identifiers.  THIS ENCOUNTER IS A VIRTUAL VISIT DUE TO COVID-19 - PATIENT WAS NOT SEEN IN THE OFFICE. PATIENT HAS CONSENTED TO VIRTUAL VISIT / TELEMEDICINE VISIT   Location of patient: home  Location of provider: office  I discussed the limitations, risks, security and privacy concerns of performing an evaluation and management service by telephone and the availability of in person appointments. I also discussed with the patient that there may be a patient responsible charge related to this service. The patient expressed understanding and agreed to proceed.   History of Present Illness: In follow-up Patient states she is doing well. Good compliance with medications Labs were reviewed together with the patient. Ambulatory BPs in the 120/80 Ambulatory CBGs in the 120s  Review of Systems  Denies fever, chills. No chest pain or difficulty breathing No cough She is following all the CDC guidelines regards the COVID-19 pandemia  Past Medical History:  Diagnosis Date  . Back pain, chronic   . BCC (basal cell carcinoma of skin)    several, nose , 2012 L forehead  . Degeneration of lumbar intervertebral disc    Dr. Nelva Bush  . Diabetes mellitus   . HTN (hypertension)   . Hyperlipidemia   . Hyperparathyroidism   . Lumbago    Lumbar epidural steroid injection at L5-S1 midline by Dr. Nelva Bush  . Pelvic pain in female 2005    back and pelvic pain, s/p w-u, CT (-), u/s showed a thick endometrium, s/p Bx per gyn  . Ureteric obstruction    hydronephrosis,sees urology    Past Surgical History:  Procedure Laterality Date  . CYSTECTOMY  1989   thyriod  . LOBECTOMY  1989   s/p  RML lung  lobectomy, biopsy showed precancerous changes   . POLYPECTOMY     colon    Social History   Socioeconomic History  . Marital status: Married    Spouse name: Not on file  . Number of children: 4  . Years of education: Not on file  . Highest education level: Not on file  Occupational History  . 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 Financial resource strain: Not on file  . Food insecurity:    Worry: Not on file    Inability: Not on file  . Transportation needs:    Medical: Not on file    Non-medical: Not on file  Tobacco Use  . Smoking status: Never Smoker  . Smokeless tobacco: Never Used  Substance and Sexual Activity  . Alcohol use: Yes    Comment: Rarely  . Drug use: No  . Sexual activity: Not Currently  Lifestyle  . Physical activity:    Days per week: Not on file    Minutes per session: Not on file  . Stress: Not on file  Relationships  . Social connections:    Talks on phone: Not on file    Gets together: Not on file    Attends religious service: Not on file    Active member of club or organization: Not on file    Attends meetings of clubs or organizations:  Not on file    Relationship status: Not on file  . Intimate partner violence:    Fear of current or ex partner: Not on file    Emotionally abused: Not on file    Physically abused: Not on file    Forced sexual activity: Not on file  Other Topics Concern  . Not on file  Social History Narrative   Lives w/ husband                Allergies as of 08/22/2018      Reactions   Cefixime    unknown   Cefprozil Nausea And Vomiting   Cefuroxime Axetil Nausea And Vomiting   Nitrofurantoin    unknown   Penicillins    unknown   Sulfonamide Derivatives    unknown      Medication List       Accurate as of August 22, 2018 11:09 AM. Always use your most recent med list.        acetaminophen 500 MG tablet Commonly known as:  TYLENOL Take 1,250 mg  by mouth every 6 (six) hours as needed for mild pain.   albuterol 108 (90 Base) MCG/ACT inhaler Commonly known as:  PROVENTIL HFA;VENTOLIN HFA Inhale 2 puffs into the lungs every 6 (six) hours as needed for wheezing.   amLODipine 5 MG tablet Commonly known as:  NORVASC Take 1 tablet (5 mg total) by mouth daily.   augmented betamethasone dipropionate 0.05 % cream Commonly known as:  DIPROLENE-AF Apply topically 2 (two) times daily.   azelastine 0.1 % nasal spray Commonly known as:  ASTELIN Place 2 sprays into both nostrils 2 (two) times daily.   BIOTIN PO Take by mouth.   calcium carbonate 500 MG chewable tablet Commonly known as:  TUMS - dosed in mg elemental calcium Chew 1 tablet by mouth daily.   CINNAMON PO Take by mouth daily. Reported on 05/30/2015   EpiPen 2-Pak 0.3 mg/0.3 mL Soaj injection Generic drug:  EPINEPHrine Reported on 11/27/2015   famotidine 20 MG tablet Commonly known as:  PEPCID Take 20 mg by mouth 2 (two) times daily as needed.   fluticasone 110 MCG/ACT inhaler Commonly known as:  FLOVENT HFA Inhale 1-2 puffs into the lungs 2 (two) times daily.   fluticasone 50 MCG/ACT nasal spray Commonly known as:  FLONASE Place 2 sprays into the nose daily.   furosemide 20 MG tablet Commonly known as:  LASIX Take 1 tablet (20 mg total) by mouth daily.   glucose blood test strip Commonly known as:  ONE TOUCH ULTRA TEST TEST ONCE DAILY   HYDROcodone-acetaminophen 10-325 MG tablet Commonly known as:  NORCO Take 1 tablet by mouth every 8 (eight) hours as needed.   lisinopril 20 MG tablet Commonly known as:  PRINIVIL,ZESTRIL Take 1 tablet (20 mg total) by mouth daily.   loratadine 10 MG tablet Commonly known as:  CLARITIN Take 10 mg by mouth daily.   MAALOX ANTI-GAS PO Take by mouth as needed. Any anti-gas medication[tablet as needed].   metFORMIN 500 MG tablet Commonly known as:  GLUCOPHAGE Take 1.5 tablets (750 mg total) by mouth 2 (two) times  daily with a meal.   multivitamin,tx-minerals tablet Take 1 tablet by mouth daily.   naproxen sodium 220 MG tablet Commonly known as:  ALEVE Take 220 mg by mouth daily.   NON FORMULARY ALLERGY INJECTIONS.   OneTouch Delica Lancets Fine Misc use to TEST ONCE DAILY   tobramycin-dexamethasone ophthalmic solution Commonly known  as:  TOBRADEX Place 1 drop into both eyes 4 (four) times daily.           Objective:   Physical Exam There were no vitals taken for this visit. This was a telephone interview, she was alert oriented x3, no distress noted.    Assessment    Assessment DM, complicated by neuropathy and callus HTN Hyperlipidemia, intolerant to medications , not checking labs Allergies, B-spasm, get shots----- sees Dr Donneta Romberg  Fatigue, chronic GU:  Hydronephrosis  dx ~ 2007, follow-up with serial USs. DX was right UPJ obstruction. Had a Lasix renogram 2013 ( R 46%, L 54%) Dr. Jasmine December, rx to RTC PRN.  [place] 11-2016 : no obstruction MSK: --DJD --Chronic back pain, dr Nelva Bush , he rx hydrocodone BCC H/o pelvic pain, 2005, w/u per gyn H/o hyperparathyroidism H/o RML Lobectomy, 1989, bx precancerous cells ?  Heart murmur: Echocardiogram 04/2017 essentially normal  PLAN This is a telephone visit, under normal circumstances I would bring the patient to the office and get a BMP and A1c but will avoid our office visit due to COVID-19 DM: Currently on metformin, last A1c satisfactory. HTN: Currently on amlodipine Lasix, lisinopril.  Ambulatory BP satisfactory.  No change Allergies, bronchospasms: Not able to go to her allergist and get shots due to COVID-19, symptoms are bothersome whenever she goes outside and is exposed to the pollen.  Recommend to continue with Flonase, increase Astelin to twice a day and continue Claritin. Aspirin: Not taking aspirin. [place] per current guidelines  COVID-19 education: Encouraged to continue following all the precautions. RTC 3 months, hopefully  face-to-face, patient will call and make an appointment.    I discussed the assessment and treatment plan with the patient. The patient was provided an opportunity to ask questions and all were answered. The patient agreed with the plan and demonstrated an understanding of the instructions.   The patient was advised to call back or seek an in-person evaluation if the symptoms worsen or if the condition fails to improve as anticipated.  I provided 18  minutes of non-face-to-face time during this encounter.  Kathlene November, MD

## 2018-08-23 NOTE — Assessment & Plan Note (Signed)
This is a telephone visit, under normal circumstances I would bring the patient to the office and get a BMP and A1c but will avoid our office visit due to COVID-19 DM: Currently on metformin, last A1c satisfactory. HTN: Currently on amlodipine Lasix, lisinopril.  Ambulatory BP satisfactory.  No change Allergies, bronchospasms: Not able to go to her allergist and get shots due to COVID-19, symptoms are bothersome whenever she goes outside and is exposed to the pollen.  Recommend to continue with Flonase, increase Astelin to twice a day and continue Claritin. Aspirin: Not taking aspirin. [place] per current guidelines  COVID-19 education: Encouraged to continue following all the precautions. RTC 3 months, hopefully face-to-face, patient will call and make an appointment.

## 2018-09-19 ENCOUNTER — Encounter: Payer: Self-pay | Admitting: Internal Medicine

## 2018-09-19 ENCOUNTER — Other Ambulatory Visit: Payer: Self-pay

## 2018-09-19 ENCOUNTER — Ambulatory Visit (INDEPENDENT_AMBULATORY_CARE_PROVIDER_SITE_OTHER): Payer: Medicare Other | Admitting: Internal Medicine

## 2018-09-19 DIAGNOSIS — R197 Diarrhea, unspecified: Secondary | ICD-10-CM

## 2018-09-19 DIAGNOSIS — K648 Other hemorrhoids: Secondary | ICD-10-CM

## 2018-09-19 MED ORDER — HYDROCORTISONE ACETATE 25 MG RE SUPP: 25.0000 mg | Freq: Two times a day (BID) | RECTAL | 1 refills | Status: DC | PRN

## 2018-09-19 NOTE — Progress Notes (Signed)
Subjective:    Patient ID: Natalie [name], female    DOB: January 01, 1933, 85 y.o.   MRN: [id number]  DOS:  09/19/2018 Type of visit - description: Attempted  to make this a video visit, due to technical difficulties from the patient side it was not possible  thus we proceeded with a Virtual Visit via Telephone    I connected with@ on 09/19/18 at 11:20 AM EDT by telephone and verified that I am speaking with the correct person using two identifiers.  THIS ENCOUNTER IS A VIRTUAL VISIT DUE TO COVID-19 - PATIENT WAS NOT SEEN IN THE OFFICE. PATIENT HAS CONSENTED TO VIRTUAL VISIT / TELEMEDICINE VISIT   Location of patient: home  Location of provider: office  I discussed the limitations, risks, security and privacy concerns of performing an evaluation and management service by telephone and the availability of in person appointments. I also discussed with the patient that there may be a patient responsible charge related to this service. The patient expressed understanding and agreed to proceed.   History of Present Illness: Acute visit Having diarrhea for the last 10 days, started  after she ate asparagus although she is not sure if that is the culprit. The diarrhea is watery, several BMs daily.  She is doing a bland diet and has taking Imodium few times, diarrhea is slowing down. She request  a refill on Anusol HC, she has a history of internal hemorrhoids and lately she has been having some discomfort there with bowel movements. Reports good compliance with all routine medicines    Review of Systems  No fever chills No nausea or vomiting No blood in the stools Minimal abdominal discomfort a couple of times otherwise no pain. Past Medical History:  Diagnosis Date  . Back pain, chronic   . BCC (basal cell carcinoma of skin)    several, nose , 2012 L forehead  . Degeneration of lumbar intervertebral disc    Dr. Nelva Bush  . Diabetes mellitus   . HTN (hypertension)   . Hyperlipidemia    . Hyperparathyroidism   . Lumbago    Lumbar epidural steroid injection at L5-S1 midline by Dr. Nelva Bush  . Pelvic pain in female 2005    back and pelvic pain, s/p w-u, CT (-), u/s showed a thick endometrium, s/p Bx per gyn  . Ureteric obstruction    hydronephrosis,sees urology    Past Surgical History:  Procedure Laterality Date  . CYSTECTOMY  1989   thyriod  . LOBECTOMY  1989   s/p  RML lung lobectomy, biopsy showed precancerous changes   . POLYPECTOMY     colon    Social History   Socioeconomic History  . Marital status: Married    Spouse name: Not on file  . Number of children: 4  . Years of education: Not on file  . Highest education level: Not on file  Occupational History  . Occupation: retired Firefighter: [employer]  Social Needs  . Financial resource strain: Not on file  . Food insecurity:    Worry: Not on file    Inability: Not on file  . Transportation needs:    Medical: Not on file    Non-medical: Not on file  Tobacco Use  . Smoking status: Never Smoker  . Smokeless tobacco: Never Used  Substance and Sexual Activity  . Alcohol use: Yes    Comment: Rarely  . Drug use: No  . Sexual activity: Not Currently  Lifestyle  . Physical activity:    Days per week: Not on file    Minutes per session: Not on file  . Stress: Not on file  Relationships  . Social connections:    Talks on phone: Not on file    Gets together: Not on file    Attends religious service: Not on file    Active member of club or organization: Not on file    Attends meetings of clubs or organizations: Not on file    Relationship status: Not on file  . Intimate partner violence:    Fear of current or ex partner: Not on file    Emotionally abused: Not on file    Physically abused: Not on file    Forced sexual activity: Not on file  Other Topics Concern  . Not on file  Social History Narrative   Lives w/ husband                Allergies as of  09/19/2018      Reactions   Cefixime    unknown   Cefprozil Nausea And Vomiting   Cefuroxime Axetil Nausea And Vomiting   Nitrofurantoin    unknown   Penicillins    unknown   Sulfonamide Derivatives    unknown      Medication List       Accurate as of Sep 19, 2018 11:31 AM. Always use your most recent med list.        acetaminophen 500 MG tablet Commonly known as:  TYLENOL Take 1,250 mg by mouth every 6 (six) hours as needed for mild pain.   albuterol 108 (90 Base) MCG/ACT inhaler Commonly known as:  VENTOLIN HFA Inhale 2 puffs into the lungs every 6 (six) hours as needed for wheezing.   amLODipine 5 MG tablet Commonly known as:  NORVASC Take 1 tablet (5 mg total) by mouth daily.   augmented betamethasone dipropionate 0.05 % cream Commonly known as:  DIPROLENE-AF Apply topically 2 (two) times daily.   azelastine 0.1 % nasal spray Commonly known as:  ASTELIN Place 2 sprays into both nostrils 2 (two) times daily.   BIOTIN PO Take by mouth.   calcium carbonate 500 MG chewable tablet Commonly known as:  TUMS - dosed in mg elemental calcium Chew 1 tablet by mouth daily.   CINNAMON PO Take by mouth daily. Reported on 05/30/2015   EpiPen 2-Pak 0.3 mg/0.3 mL Soaj injection Generic drug:  EPINEPHrine Reported on 11/27/2015   famotidine 20 MG tablet Commonly known as:  PEPCID Take 20 mg by mouth 2 (two) times daily as needed.   fluticasone 110 MCG/ACT inhaler Commonly known as:  FLOVENT HFA Inhale 1-2 puffs into the lungs 2 (two) times daily.   fluticasone 50 MCG/ACT nasal spray Commonly known as:  FLONASE Place 2 sprays into the nose daily.   furosemide 20 MG tablet Commonly known as:  LASIX Take 1 tablet (20 mg total) by mouth daily.   glucose blood test strip Commonly known as:  ONE TOUCH ULTRA TEST TEST ONCE DAILY   HYDROcodone-acetaminophen 10-325 MG tablet Commonly known as:  NORCO Take 1 tablet by mouth every 8 (eight) hours as needed.    lisinopril 20 MG tablet Commonly known as:  ZESTRIL Take 1 tablet (20 mg total) by mouth daily.   loratadine 10 MG tablet Commonly known as:  CLARITIN Take 10 mg by mouth daily.   MAALOX ANTI-GAS PO Take by mouth as needed. Any anti-gas medication[tablet  as needed].   metFORMIN 500 MG tablet Commonly known as:  GLUCOPHAGE Take 1.5 tablets (750 mg total) by mouth 2 (two) times daily with a meal.   multivitamin,tx-minerals tablet Take 1 tablet by mouth daily.   naproxen sodium 220 MG tablet Commonly known as:  ALEVE Take 220 mg by mouth daily.   NON FORMULARY ALLERGY INJECTIONS.   OneTouch Delica Lancets Fine Misc use to TEST ONCE DAILY           Objective:   Physical Exam There were no vitals taken for this visit. This is a phone virtual visit, alert oriented x3, no apparent distress    Assessment    Assessment DM, complicated by neuropathy and callus HTN Hyperlipidemia, intolerant to medications , not checking labs Allergies, B-spasm, get shots----- sees Dr Donneta Romberg  Fatigue, chronic GU:  Hydronephrosis  dx ~ 2007, follow-up with serial USs. DX was right UPJ obstruction. Had a Lasix renogram 2013 ( R 46%, L 54%) Dr. Jasmine December, rx to [facility] PRN.  [place] 11-2016 : no obstruction MSK: --DJD --Chronic back pain, dr Nelva Bush , he rx hydrocodone BCC H/o pelvic pain, 2005, w/u per gyn H/o hyperparathyroidism H/o RML Lobectomy, 1989, bx precancerous cells ?  Heart murmur: Echocardiogram 04/2017 essentially normal  PLAN Acute diarrhea: Diarrhea for the last 10 days as described above, no red flag symptoms, getting better with conservative treatment.  We agreed to continue bland diet and gradually transition back to a regular diet. Push fluids, use probiotics and call if symptoms get worse or not gradually better. Internal hemorrhoids: History of internal hemorrhoids, having ano- rectal discomfort with BMs in the context of diarrhea.  She knows the limitation of a virtual visit  but I agree with the patient, she probably has internal hemorrhoid flareup.  Historically, Anusol HC helped, prescription sent, to be used as needed.  To let me know if is not improving.    I discussed the assessment and treatment plan with the patient. The patient was provided an opportunity to ask questions and all were answered. The patient agreed with the plan and demonstrated an understanding of the instructions.   The patient was advised to call back or seek an in-person evaluation if the symptoms worsen or if the condition fails to improve as anticipated.  I provided 16 minutes of non-face-to-face time during this encounter.  Kathlene November, MD

## 2018-09-20 NOTE — Assessment & Plan Note (Signed)
Acute diarrhea: Diarrhea for the last 10 days as described above, no red flag symptoms, getting better with conservative treatment.  We agreed to continue bland diet and gradually transition back to a regular diet. Push fluids, use probiotics and call if symptoms get worse or not gradually better. Internal hemorrhoids: History of internal hemorrhoids, having ano- rectal discomfort with BMs in the context of diarrhea.  She knows the limitation of a virtual visit but I agree with the patient, she probably has internal hemorrhoid flareup.  Historically, Anusol HC helped, prescription sent, to be used as needed.  To let me know if is not improving.

## 2018-11-09 ENCOUNTER — Other Ambulatory Visit: Payer: Self-pay | Admitting: Internal Medicine

## 2018-11-28 ENCOUNTER — Telehealth: Payer: Self-pay

## 2018-11-28 NOTE — Telephone Encounter (Signed)
Copied from [place] [id number]. Topic: General - Other >> Nov 28, 2018  9:49 AM Yvette Rack wrote: Reason for CRM: Pt stated she needs a letter from Dr. Larose Kells to renew her handicapped placard. Pt requests that the letter be sent to her home.

## 2018-11-28 NOTE — Telephone Encounter (Signed)
Please advise 

## 2018-11-28 NOTE — Telephone Encounter (Signed)
Form completed and mailed to Pt. Copy of form sent for scanning.

## 2018-11-28 NOTE — Telephone Encounter (Signed)
Agree, please send a letter, due to multiple medical problems she is unable to walk far without stopping.

## 2018-12-07 ENCOUNTER — Ambulatory Visit: Payer: Medicare Other | Admitting: Internal Medicine

## 2019-01-10 ENCOUNTER — Other Ambulatory Visit: Payer: Self-pay

## 2019-01-10 ENCOUNTER — Ambulatory Visit: Payer: Medicare Other | Admitting: Internal Medicine

## 2019-01-10 ENCOUNTER — Ambulatory Visit (INDEPENDENT_AMBULATORY_CARE_PROVIDER_SITE_OTHER): Payer: Medicare Other | Admitting: Internal Medicine

## 2019-01-10 DIAGNOSIS — R399 Unspecified symptoms and signs involving the genitourinary system: Secondary | ICD-10-CM | POA: Diagnosis not present

## 2019-01-10 DIAGNOSIS — E1140 Type 2 diabetes mellitus with diabetic neuropathy, unspecified: Secondary | ICD-10-CM

## 2019-01-10 DIAGNOSIS — I10 Essential (primary) hypertension: Secondary | ICD-10-CM

## 2019-01-10 DIAGNOSIS — E785 Hyperlipidemia, unspecified: Secondary | ICD-10-CM | POA: Diagnosis not present

## 2019-01-10 NOTE — Progress Notes (Signed)
Subjective:    Patient ID: Natalie [name], female    DOB: [birth year], 85 y.o.   MRN: [id number]  DOS:  01/10/2019 Type of visit - description: Attempted  to make this a video visit, due to technical difficulties from the patient side it was not possible  thus we proceeded with a Virtual Visit via Telephone    I connected with@   by telephone and verified that I am speaking with the correct person using two identifiers.  THIS ENCOUNTER IS A VIRTUAL VISIT DUE TO COVID-19 - PATIENT WAS NOT SEEN IN THE OFFICE. PATIENT HAS CONSENTED TO VIRTUAL VISIT / TELEMEDICINE VISIT   Location of patient: home  Location of provider: office  I discussed the limitations, risks, security and privacy concerns of performing an evaluation and management service by telephone and the availability of in person appointments. I also discussed with the patient that there may be a patient responsible charge related to this service. The patient expressed understanding and agreed to proceed.   History of Present Illness: Routine visit DM: Good compliance with medications, ambulatory CBGs in the 120s HTN: No recent ambulatory BPs. Back pain: Currently taking have hydrocodone most days.  She also takes Aleve occasionally. UTI?  Back pain has been exacerbated for the last 3 or 4 days, patient believes could be due to a UTI. Denies any injury, no radiation, pain is worse when she bends her torso.  Review of Systems Denies chest pain, difficulty breathing or cough No fever chills Occasional dysuria.  No gross hematuria or difficulty urinating.   Past Medical History:  Diagnosis Date  . Back pain, chronic   . BCC (basal cell carcinoma of skin)    several, nose , 2012 L forehead  . Degeneration of lumbar intervertebral disc    Dr. Nelva Bush  . Diabetes mellitus   . HTN (hypertension)   . Hyperlipidemia   . Hyperparathyroidism   . Lumbago    Lumbar epidural steroid injection at L5-S1 midline by Dr. Nelva Bush  .  Pelvic pain in female 2005    back and pelvic pain, s/p w-u, CT (-), u/s showed a thick endometrium, s/p Bx per gyn  . Ureteric obstruction    hydronephrosis,sees urology    Past Surgical History:  Procedure Laterality Date  . CYSTECTOMY  1989   thyriod  . LOBECTOMY  1989   s/p  RML lung lobectomy, biopsy showed precancerous changes   . POLYPECTOMY     colon    Social History   Socioeconomic History  . Marital status: Married    Spouse name: Not on file  . Number of children: 4  . Years of education: Not on file  . Highest education level: Not on file  Occupational History  . 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 Financial resource strain: Not on file  . Food insecurity    Worry: Not on file    Inability: Not on file  . Transportation needs    Medical: Not on file    Non-medical: Not on file  Tobacco Use  . Smoking status: Never Smoker  . Smokeless tobacco: Never Used  Substance and Sexual Activity  . Alcohol use: Yes    Comment: Rarely  . Drug use: No  . Sexual activity: Not Currently  Lifestyle  . Physical activity    Days per week: Not on file    Minutes per session: Not on file  .  Stress: Not on file  Relationships  . Social connections    Talks on phone: Not on file    Gets together: Not on file    Attends religious service: Not on file    Active member of club or organization: Not on file    Attends meetings of clubs or organizations: Not on file    Relationship status: Not on file  . Intimate partner violence    Fear of current or ex partner: Not on file    Emotionally abused: Not on file    Physically abused: Not on file    Forced sexual activity: Not on file  Other Topics Concern  . Not on file  Social History Narrative   Lives w/ husband                Allergies as of 01/10/2019      Reactions   Cefixime    unknown   Cefprozil Nausea And Vomiting   Cefuroxime Axetil Nausea And  Vomiting   Nitrofurantoin    unknown   Penicillins    unknown   Sulfonamide Derivatives    unknown      Medication List       Accurate as of January 10, 2019 11:59 PM. If you have any questions, ask your nurse or doctor.        acetaminophen 500 MG tablet Commonly known as: TYLENOL Take 1,250 mg by mouth every 6 (six) hours as needed for mild pain.   albuterol 108 (90 Base) MCG/ACT inhaler Commonly known as: VENTOLIN HFA Inhale 2 puffs into the lungs every 6 (six) hours as needed for wheezing.   amLODipine 5 MG tablet Commonly known as: NORVASC Take 1 tablet (5 mg total) by mouth daily.   augmented betamethasone dipropionate 0.05 % cream Commonly known as: DIPROLENE-AF Apply topically 2 (two) times daily.   azelastine 0.1 % nasal spray Commonly known as: ASTELIN Place 2 sprays into both nostrils 2 (two) times daily.   BIOTIN PO Take by mouth.   calcium carbonate 500 MG chewable tablet Commonly known as: TUMS - dosed in mg elemental calcium Chew 1 tablet by mouth daily.   CINNAMON PO Take by mouth daily. Reported on 05/30/2015   EpiPen 2-Pak 0.3 mg/0.3 mL Soaj injection Generic drug: EPINEPHrine Reported on 11/27/2015   famotidine 20 MG tablet Commonly known as: PEPCID Take 20 mg by mouth 2 (two) times daily as needed.   fluticasone 110 MCG/ACT inhaler Commonly known as: FLOVENT HFA Inhale 1-2 puffs into the lungs 2 (two) times daily.   fluticasone 50 MCG/ACT nasal spray Commonly known as: FLONASE Place 2 sprays into the nose daily.   furosemide 20 MG tablet Commonly known as: LASIX Take 1 tablet (20 mg total) by mouth daily.   glucose blood test strip Commonly known as: ONE TOUCH ULTRA TEST TEST ONCE DAILY   HYDROcodone-acetaminophen 10-325 MG tablet Commonly known as: NORCO Take 1 tablet by mouth every 8 (eight) hours as needed.   hydrocortisone 25 MG suppository Commonly known as: ANUSOL-HC Place 1 suppository (25 mg total) rectally 2 (two)  times daily as needed for hemorrhoids.   lisinopril 20 MG tablet Commonly known as: ZESTRIL Take 1 tablet (20 mg total) by mouth daily.   loratadine 10 MG tablet Commonly known as: CLARITIN Take 10 mg by mouth daily.   MAALOX ANTI-GAS PO Take by mouth as needed. Any anti-gas medication[tablet as needed].   metFORMIN 500 MG tablet Commonly known as: GLUCOPHAGE   Take 1.5 tablets (750 mg total) by mouth 2 (two) times daily with a meal.   multivitamin,tx-minerals tablet Take 1 tablet by mouth daily.   naproxen sodium 220 MG tablet Commonly known as: ALEVE Take 220 mg by mouth daily.   NON FORMULARY ALLERGY INJECTIONS.   OneTouch Delica Lancets Fine Misc use to TEST ONCE DAILY           Objective:   Physical Exam There were no vitals taken for this visit. This is a virtual phone visit, she is alert oriented x3, in no distress.    Assessment     Assessment DM, complicated by neuropathy and callus HTN Hyperlipidemia, intolerant to medications , not checking labs Allergies, B-spasm, get shots----- sees Dr Sharma  Fatigue, chronic GU:  Hydronephrosis  dx ~ 2007, follow-up with serial USs. DX was right UPJ obstruction. Had a Lasix renogram 2013 ( R 46%, L 54%) Dr. Woodruff, rx to [facility] PRN.  [place] 11-2016 : no obstruction MSK: --DJD --Chronic back pain, dr Ramos , he rx hydrocodone BCC H/o pelvic pain, 2005, w/u per gyn H/o hyperparathyroidism H/o RML Lobectomy, 1989, bx precancerous cells ?  Heart murmur: Echocardiogram 04/2017 essentially normal  PLAN DM: Currently on metformin, room for improvement on her diet, she is counseled.  Will check A1c. HTN: No ambulatory BPs, recommend to check BPs regularly.  Continue amlodipine, Lasix, lisinopril.  Check a CMP and CBC Back pain: Slightly exacerbated for the last few days, patient thinks she might have a UTI.  Will check a UA urine culture.  Rx ABX if appropriate Also, she takes Aleve fortunately only half tablet a few  times a week.  Encouraged to take as little as possible of NSAIDs and always with food. Preventive care: Declined a flu shot this month Plan: Labs in few days RTC 4 months for a physical exam    I discussed the assessment and treatment plan with the patient. The patient was provided an opportunity to ask questions and all were answered. The patient agreed with the plan and demonstrated an understanding of the instructions.   The patient was advised to call back or seek an in-person evaluation if the symptoms worsen or if the condition fails to improve as anticipated.  I provided 25 minutes of non-face-to-face time during this encounter.  Jose Paz, MD     

## 2019-01-10 NOTE — Progress Notes (Signed)
called twice, no answer. Will try to reschedule

## 2019-01-11 ENCOUNTER — Other Ambulatory Visit (INDEPENDENT_AMBULATORY_CARE_PROVIDER_SITE_OTHER): Payer: Medicare Other

## 2019-01-11 ENCOUNTER — Other Ambulatory Visit: Payer: Self-pay

## 2019-01-11 DIAGNOSIS — R399 Unspecified symptoms and signs involving the genitourinary system: Secondary | ICD-10-CM | POA: Diagnosis not present

## 2019-01-11 DIAGNOSIS — E1140 Type 2 diabetes mellitus with diabetic neuropathy, unspecified: Secondary | ICD-10-CM

## 2019-01-11 DIAGNOSIS — I10 Essential (primary) hypertension: Secondary | ICD-10-CM | POA: Diagnosis not present

## 2019-01-11 NOTE — Assessment & Plan Note (Signed)
DM: Currently on metformin, room for improvement on her diet, she is counseled.  Will check A1c. HTN: No ambulatory BPs, recommend to check BPs regularly.  Continue amlodipine, Lasix, lisinopril.  Check a CMP and CBC Back pain: Slightly exacerbated for the last few days, patient thinks she might have a UTI.  Will check a UA urine culture.  Rx ABX if appropriate Also, she takes Aleve fortunately only half tablet a few times a week.  Encouraged to take as little as possible of NSAIDs and always with food. Preventive care: Declined a flu shot this month Plan: Labs in few days RTC 4 months for a physical exam

## 2019-01-12 LAB — CBC WITH DIFFERENTIAL/PLATELET
Basophils Absolute: 0.1 10*3/uL (ref 0.0–0.1)
Basophils Relative: 1.2 % (ref 0.0–3.0)
Eosinophils Absolute: 0.2 10*3/uL (ref 0.0–0.7)
Eosinophils Relative: 2.2 % (ref 0.0–5.0)
HCT: 38.9 % (ref 36.0–46.0)
Hemoglobin: 12.9 g/dL (ref 12.0–15.0)
Lymphocytes Relative: 42.6 % (ref 12.0–46.0)
Lymphs Abs: 4.2 10*3/uL — ABNORMAL HIGH (ref 0.7–4.0)
MCHC: 33.2 g/dL (ref 30.0–36.0)
MCV: 88.3 fl (ref 78.0–100.0)
Monocytes Absolute: 0.8 10*3/uL (ref 0.1–1.0)
Monocytes Relative: 8 % (ref 3.0–12.0)
Neutro Abs: 4.6 10*3/uL (ref 1.4–7.7)
Neutrophils Relative %: 46 % (ref 43.0–77.0)
Platelets: 327 10*3/uL (ref 150.0–400.0)
RBC: 4.4 Mil/uL (ref 3.87–5.11)
RDW: 14.4 % (ref 11.5–15.5)
WBC: 9.9 10*3/uL (ref 4.0–10.5)

## 2019-01-12 LAB — COMPREHENSIVE METABOLIC PANEL
ALT: 14 U/L (ref 0–35)
AST: 23 U/L (ref 0–37)
Albumin: 4.5 g/dL (ref 3.5–5.2)
Alkaline Phosphatase: 51 U/L (ref 39–117)
BUN: 14 mg/dL (ref 6–23)
CO2: 25 mEq/L (ref 19–32)
Calcium: 10.3 mg/dL (ref 8.4–10.5)
Chloride: 98 mEq/L (ref 96–112)
Creatinine, Ser: 0.75 mg/dL (ref 0.40–1.20)
GFR: 73.65 mL/min (ref >60.00)
Glucose, Bld: 113 mg/dL — ABNORMAL HIGH (ref 70–99)
Potassium: 4.5 mEq/L (ref 3.5–5.1)
Sodium: 134 mEq/L — ABNORMAL LOW (ref 135–145)
Total Bilirubin: 0.3 mg/dL (ref 0.2–1.2)
Total Protein: 7.3 g/dL (ref 6.0–8.3)

## 2019-01-12 LAB — URINALYSIS, ROUTINE W REFLEX MICROSCOPIC
Bilirubin Urine: NEGATIVE
Hgb urine dipstick: NEGATIVE
Ketones, ur: NEGATIVE
Nitrite: NEGATIVE
RBC / HPF: NONE SEEN (ref 0–?)
Specific Gravity, Urine: 1.01 (ref 1.000–1.030)
Total Protein, Urine: NEGATIVE
Urine Glucose: NEGATIVE
Urobilinogen, UA: 0.2 (ref 0.0–1.0)
pH: 7.5 (ref 5.0–8.0)

## 2019-01-12 LAB — HEMOGLOBIN A1C: Hgb A1c MFr Bld: 6.3 % (ref 4.6–6.5)

## 2019-01-13 LAB — URINE CULTURE
MICRO NUMBER:: 819040
SPECIMEN QUALITY:: ADEQUATE

## 2019-01-15 MED ORDER — CIPROFLOXACIN HCL 500 MG PO TABS: 500.0000 mg | ORAL_TABLET | Freq: Two times a day (BID) | ORAL | 0 refills | Status: DC

## 2019-01-15 NOTE — Addendum Note (Signed)
Addended byDamita Dunnings D on: 01/15/2019 09:14 AM   Modules accepted: Orders

## 2019-01-18 ENCOUNTER — Telehealth: Payer: Self-pay | Admitting: Internal Medicine

## 2019-01-18 MED ORDER — PHENAZOPYRIDINE HCL 200 MG PO TABS: 200.0000 mg | ORAL_TABLET | Freq: Three times a day (TID) | ORAL | 0 refills | Status: DC

## 2019-01-18 NOTE — Telephone Encounter (Signed)
Patient completed ciprofloxacin (CIPRO) 500 MG tablet and states she's experiencing slight burning seeking clinical advice, please advise

## 2019-01-18 NOTE — Telephone Encounter (Signed)
Spoke w/ Pt- informed of recommendations. Pt verbalized understanding.  

## 2019-01-18 NOTE — Telephone Encounter (Signed)
Please advise 

## 2019-01-18 NOTE — Telephone Encounter (Signed)
Tried calling Pt- no answer, unable to leave message. Will try again later.  

## 2019-01-18 NOTE — Telephone Encounter (Signed)
She had the appropriate antibiotics based on urine culture. Advise patient: Drink plenty fluids Get  cranberry juice or cranberry supplements OTC Pyridium for 3 days, prescription sent Call next week if not gradually better.  Call anytime if fever, chills or severe symptoms.

## 2019-01-20 ENCOUNTER — Other Ambulatory Visit: Payer: Self-pay | Admitting: Internal Medicine

## 2019-01-31 ENCOUNTER — Other Ambulatory Visit: Payer: Self-pay | Admitting: Internal Medicine

## 2019-02-12 ENCOUNTER — Telehealth: Payer: Self-pay

## 2019-02-12 NOTE — Telephone Encounter (Signed)
Results from 01/11/2019 placed in mail.

## 2019-02-12 NOTE — Telephone Encounter (Signed)
Copied from [place] [id number]. Topic: General - Other >> Feb 12, 2019 11:44 AM Keene Breath wrote: Reason for CRM: Patient called to request a printout of her blood test results.  Please mail results to patient.  If there are any problems, please call at [phone]

## 2019-02-22 ENCOUNTER — Encounter: Payer: Self-pay | Admitting: Gynecology

## 2019-04-25 ENCOUNTER — Other Ambulatory Visit: Payer: Self-pay | Admitting: Internal Medicine

## 2019-05-01 ENCOUNTER — Other Ambulatory Visit: Payer: Self-pay | Admitting: Internal Medicine

## 2019-05-08 ENCOUNTER — Other Ambulatory Visit: Payer: Self-pay | Admitting: Internal Medicine

## 2019-05-22 ENCOUNTER — Ambulatory Visit (INDEPENDENT_AMBULATORY_CARE_PROVIDER_SITE_OTHER): Payer: Medicare PPO | Admitting: Internal Medicine

## 2019-05-22 ENCOUNTER — Other Ambulatory Visit: Payer: Self-pay

## 2019-05-22 DIAGNOSIS — I10 Essential (primary) hypertension: Secondary | ICD-10-CM | POA: Diagnosis not present

## 2019-05-22 DIAGNOSIS — M545 Low back pain, unspecified: Secondary | ICD-10-CM

## 2019-05-22 DIAGNOSIS — E1140 Type 2 diabetes mellitus with diabetic neuropathy, unspecified: Secondary | ICD-10-CM | POA: Diagnosis not present

## 2019-05-22 DIAGNOSIS — R300 Dysuria: Secondary | ICD-10-CM

## 2019-05-22 NOTE — Progress Notes (Signed)
Subjective:    Patient ID: Natalie [name], female    DOB: Nov 14, 1932, 86 y.o.   MRN: [id number]  DOS:  05/22/2019 Type of visit - description:Attempted  to make this a video visit, due to technical difficulties from the patient side it was not possible  thus we proceeded with a Virtual Visit via Telephone    I connected with above mentioned patient  by telephone and verified that I am speaking with the correct person using two identifiers.  THIS ENCOUNTER IS A VIRTUAL VISIT DUE TO COVID-19 - PATIENT WAS NOT SEEN IN THE OFFICE. PATIENT HAS CONSENTED TO VIRTUAL VISIT / TELEMEDICINE VISIT   Location of patient: home  Location of provider: office  I discussed the limitations, risks, security and privacy concerns of performing an evaluation and management service by telephone and the availability of in person appointments. I also discussed with the patient that there may be a patient responsible charge related to this service. The patient expressed understanding and agreed to proceed.   History of Present Illness: Routine follow-up In general feeling well. Some mornings, complains of left ear tinnitus, that is a reason why she decided to check her blood pressure, in the morning sometimes BPs in the 150s, 160s. In the afternoons is usually in the 130s. Good compliance with medications except Lasix, she does not take that every day due to making her to urinate too often.  Thinks she may have a UTI, reports occasional mild back pain, not a new issue. Also occasionally has mild dysuria but denies dysuria, gross hematuria.  Review of Systems  No fever chills No chest pain No cough  Past Medical History:  Diagnosis Date  . Back pain, chronic   . BCC (basal cell carcinoma of skin)    several, nose , 2012 L forehead  . Degeneration of lumbar intervertebral disc    Dr. Nelva Bush  . Diabetes mellitus   . HTN (hypertension)   . Hyperlipidemia   . Hyperparathyroidism   . Lumbago    Lumbar  epidural steroid injection at L5-S1 midline by Dr. Nelva Bush  . Pelvic pain in female 2005    back and pelvic pain, s/p w-u, CT (-), u/s showed a thick endometrium, s/p Bx per gyn  . Ureteric obstruction    hydronephrosis,sees urology    Past Surgical History:  Procedure Laterality Date  . CYSTECTOMY  1989   thyriod  . LOBECTOMY  1989   s/p  RML lung lobectomy, biopsy showed precancerous changes   . POLYPECTOMY     colon    Social History   Socioeconomic History  . Marital status: Married    Spouse name: Not on file  . Number of children: 4  . Years of education: Not on file  . Highest education level: Not on file  Occupational History  . Occupation: retired Firefighter: [employer]  Tobacco Use  . Smoking status: Never Smoker  . Smokeless tobacco: Never Used  Substance and Sexual Activity  . Alcohol use: Yes    Comment: Rarely  . Drug use: No  . Sexual activity: Not Currently  Other Topics Concern  . Not on file  Social History Narrative   Lives w/ husband             Social Determinants of Health   Financial Resource Strain:   . Difficulty of Paying Living Expenses: Not on file  Food Insecurity:   . Worried About Estate manager/land agent  of Food in the Last Year: Not on file  . Ran Out of Food in the Last Year: Not on file  Transportation Needs:   . Lack of Transportation (Medical): Not on file  . Lack of Transportation (Non-Medical): Not on file  Physical Activity:   . Days of Exercise per Week: Not on file  . Minutes of Exercise per Session: Not on file  Stress:   . Feeling of Stress : Not on file  Social Connections:   . Frequency of Communication with Friends and Family: Not on file  . Frequency of Social Gatherings with Friends and Family: Not on file  . Attends Religious Services: Not on file  . Active Member of Clubs or Organizations: Not on file  . Attends Archivist Meetings: Not on file  . Marital Status: Not on file   Intimate Partner Violence:   . Fear of Current or Ex-Partner: Not on file  . Emotionally Abused: Not on file  . Physically Abused: Not on file  . Sexually Abused: Not on file      Allergies as of 05/22/2019      Reactions   Cefixime    unknown   Cefprozil Nausea And Vomiting   Cefuroxime Axetil Nausea And Vomiting   Nitrofurantoin    unknown   Penicillins    unknown   Sulfonamide Derivatives    unknown      Medication List       Accurate as of May 22, 2019 11:59 PM. If you have any questions, ask your nurse or doctor.        STOP taking these medications   ciprofloxacin 500 MG tablet Commonly known as: Cipro Stopped by: Kathlene November, MD     TAKE these medications   acetaminophen 500 MG tablet Commonly known as: TYLENOL Take 1,250 mg by mouth every 6 (six) hours as needed for mild pain.   albuterol 108 (90 Base) MCG/ACT inhaler Commonly known as: VENTOLIN HFA Inhale 2 puffs into the lungs every 6 (six) hours as needed for wheezing.   amLODipine 5 MG tablet Commonly known as: NORVASC Take 1 tablet (5 mg total) by mouth daily.   augmented betamethasone dipropionate 0.05 % cream Commonly known as: DIPROLENE-AF Apply topically 2 (two) times daily.   azelastine 0.1 % nasal spray Commonly known as: ASTELIN Place 2 sprays into both nostrils 2 (two) times daily.   BIOTIN PO Take by mouth.   calcium carbonate 500 MG chewable tablet Commonly known as: TUMS - dosed in mg elemental calcium Chew 1 tablet by mouth daily.   CINNAMON PO Take by mouth daily. Reported on 05/30/2015   EpiPen 2-Pak 0.3 mg/0.3 mL Soaj injection Generic drug: EPINEPHrine Reported on 11/27/2015   famotidine 20 MG tablet Commonly known as: PEPCID Take 20 mg by mouth 2 (two) times daily as needed.   fluticasone 110 MCG/ACT inhaler Commonly known as: FLOVENT HFA Inhale 1-2 puffs into the lungs 2 (two) times daily.   fluticasone 50 MCG/ACT nasal spray Commonly known as: FLONASE Place  2 sprays into the nose daily.   furosemide 20 MG tablet Commonly known as: LASIX Take 1 tablet (20 mg total) by mouth daily.   HYDROcodone-acetaminophen 10-325 MG tablet Commonly known as: NORCO Take 1 tablet by mouth every 8 (eight) hours as needed.   hydrocortisone 25 MG suppository Commonly known as: ANUSOL-HC Place 1 suppository (25 mg total) rectally 2 (two) times daily as needed for hemorrhoids.   lisinopril 20 MG  tablet Commonly known as: ZESTRIL Take 1 tablet (20 mg total) by mouth daily.   loratadine 10 MG tablet Commonly known as: CLARITIN Take 10 mg by mouth daily.   MAALOX ANTI-GAS PO Take by mouth as needed. Any anti-gas medication[tablet as needed].   metFORMIN 500 MG tablet Commonly known as: GLUCOPHAGE Take 1.5 tablets (750 mg total) by mouth 2 (two) times daily with a meal.   multivitamin,tx-minerals tablet Take 1 tablet by mouth daily.   naproxen sodium 220 MG tablet Commonly known as: ALEVE Take 220 mg by mouth daily.   NON FORMULARY ALLERGY INJECTIONS.   OneTouch Delica Lancets Fine Misc use to TEST ONCE DAILY   OneTouch Ultra test strip Generic drug: glucose blood USE TO TEST BLOOD SUGAR ONCE DAILY   phenazopyridine 200 MG tablet Commonly known as: Pyridium Take 1 tablet (200 mg total) by mouth 3 (three) times daily with meals.           Objective:   Physical Exam There were no vitals taken for this visit. This is a virtual phone visit, she is alert oriented x3 in no distress    Assessment      Assessment DM, complicated by neuropathy and callus HTN Hyperlipidemia, intolerant to medications , not checking labs Allergies, B-spasm, get shots----- sees Dr Donneta Romberg  Fatigue, chronic GU:  Hydronephrosis  dx ~ 2007, follow-up with serial USs. DX was right UPJ obstruction. Had a Lasix renogram 2013 ( R 46%, L 54%) Dr. Jasmine December, rx to RTC PRN.  [place] 11-2016 : no obstruction MSK: --DJD --Chronic back pain, dr Nelva Bush , he rx  hydrocodone BCC H/o pelvic pain, 2005, w/u per gyn H/o hyperparathyroidism H/o RML Lobectomy, 1989, bx precancerous cells ?  Heart murmur: Echocardiogram 04/2017 essentially normal  PLAN DM: Well-controlled per last A1c, continue Metformin, Rx sent HTN: Ambulatory BPs in the morning and sometimes in the 150s, 160s.  She occasionally has tinnitus at the time. She takes lisinopril and amlodipine both in the morning.  She also takes Lasix but not every day because it makes her urinate too much.  Feels send Plan: Change the timing, lisinopril in a.m., amlodipine in the afternoon, hopefully will get better BP control.  Continue checking BPs, if BPs are not improving in the early morning she will let me know. UTI?  She again is concerned about having a UTI, the reason is she has back pain from time to time and she did a strep test OTC that showed possibly infection.  She is not running fever or chills, no gross hematuria, severe dysuria or difficulty urinating.  Most likely she has asymptomatic bacteriuria.  Recommend not to take antibiotics without urine or urine culture and  without clear-cut symptoms of a UTI such as fever chills, severe dysuria, gross hematuria etc. RTC 2 months CPX nonfasting  Today, I spent more than 30   min with the patient: >50% of the time counseling regards hypertension management, I also educated the patient regards not taking antibiotic due to back pain and occasional dysuria.  Multiple questions answered to the best of my ability      I discussed the assessment and treatment plan with the patient. The patient was provided an opportunity to ask questions and all were answered. The patient agreed with the plan and demonstrated an understanding of the instructions.   The patient was advised to call back or seek an in-person evaluation if the symptoms worsen or if the condition fails to improve as anticipated.  I provided 30 minutes of non-face-to-face time during this  encounter.  Kathlene November, MD

## 2019-05-23 MED ORDER — AMLODIPINE BESYLATE 5 MG PO TABS: 5.0000 mg | ORAL_TABLET | Freq: Every day | ORAL | 1 refills | Status: DC

## 2019-05-23 MED ORDER — METFORMIN HCL 500 MG PO TABS: 750.0000 mg | ORAL_TABLET | Freq: Two times a day (BID) | ORAL | 1 refills | Status: DC

## 2019-05-23 MED ORDER — FUROSEMIDE 20 MG PO TABS: 20.0000 mg | ORAL_TABLET | Freq: Every day | ORAL | 1 refills | Status: DC

## 2019-05-23 MED ORDER — LISINOPRIL 20 MG PO TABS: 20.0000 mg | ORAL_TABLET | Freq: Every day | ORAL | 1 refills | Status: DC

## 2019-05-23 NOTE — Assessment & Plan Note (Signed)
DM: Well-controlled per last A1c, continue Metformin, Rx sent HTN: Ambulatory BPs in the morning and sometimes in the 150s, 160s.  She occasionally has tinnitus at the time. She takes lisinopril and amlodipine both in the morning.  She also takes Lasix but not every day because it makes her urinate too much.  Feels send Plan: Change the timing, lisinopril in a.m., amlodipine in the afternoon, hopefully will get better BP control.  Continue checking BPs, if BPs are not improving in the early morning she will let me know. UTI?  She again is concerned about having a UTI, the reason is she has back pain from time to time and she did a strep test OTC that showed possibly infection.  She is not running fever or chills, no gross hematuria, severe dysuria or difficulty urinating.  Most likely she has asymptomatic bacteriuria.  Recommend not to take antibiotics without urine or urine culture and  without clear-cut symptoms of a UTI such as fever chills, severe dysuria, gross hematuria etc. RTC 2 months CPX nonfasting

## 2019-05-25 ENCOUNTER — Other Ambulatory Visit: Payer: Self-pay | Admitting: Internal Medicine

## 2019-05-25 NOTE — Telephone Encounter (Signed)
Refilled 05/23/19

## 2019-06-24 ENCOUNTER — Ambulatory Visit: Payer: Medicare PPO | Attending: Internal Medicine

## 2019-06-24 DIAGNOSIS — Z23 Encounter for immunization: Secondary | ICD-10-CM

## 2019-06-24 NOTE — Progress Notes (Signed)
   Covid-19 Vaccination Clinic  Name:  Natalie [name]    MRN: [id number] DOB: Oct 10, 1932  06/24/2019  [name] was observed post Covid-19 immunization for 15 minutes without incidence. She was provided with Vaccine Information Sheet and instruction to access the V-Safe system.   [name] was instructed to call 911 with any severe reactions post vaccine: Marland Kitchen Difficulty breathing  . Swelling of your face and throat  . A fast heartbeat  . A bad rash all over your body  . Dizziness and weakness    Immunizations Administered    Name Date Dose VIS Date Route   Pfizer COVID-19 Vaccine 06/24/2019  4:05 PM 0.3 mL 04/27/2019 Intramuscular   Manufacturer: [place]   Lot: [id number]   [place]: [id number]

## 2019-06-30 ENCOUNTER — Ambulatory Visit: Payer: Self-pay

## 2019-07-19 ENCOUNTER — Ambulatory Visit: Payer: Medicare PPO | Attending: Internal Medicine

## 2019-07-19 DIAGNOSIS — Z23 Encounter for immunization: Secondary | ICD-10-CM | POA: Insufficient documentation

## 2019-07-19 NOTE — Progress Notes (Signed)
   Covid-19 Vaccination Clinic  Name:  Natalie [name]    MRN: [id number] DOB: 1933-03-30  07/19/2019  Natalie [name] was observed post Covid-19 immunization for 15 minutes without incident. She was provided with Vaccine Information Sheet and instruction to access the V-Safe system.   Natalie [name] was instructed to call 911 with any severe reactions post vaccine: Marland Kitchen Difficulty breathing  . Swelling of face and throat  . A fast heartbeat  . A bad rash all over body  . Dizziness and weakness   Immunizations Administered    Name Date Dose VIS Date Route   Pfizer COVID-19 Vaccine 07/19/2019 12:59 PM 0.3 mL 04/27/2019 Intramuscular   Manufacturer: [place]   Lot: [id number]   [place]: [id number]

## 2019-07-23 DIAGNOSIS — J3081 Allergic rhinitis due to animal (cat) (dog) hair and dander: Secondary | ICD-10-CM | POA: Diagnosis not present

## 2019-07-23 DIAGNOSIS — J301 Allergic rhinitis due to pollen: Secondary | ICD-10-CM | POA: Diagnosis not present

## 2019-07-23 DIAGNOSIS — J4530 Mild persistent asthma, uncomplicated: Secondary | ICD-10-CM | POA: Diagnosis not present

## 2019-07-23 DIAGNOSIS — J3089 Other allergic rhinitis: Secondary | ICD-10-CM | POA: Diagnosis not present

## 2019-08-29 ENCOUNTER — Telehealth: Payer: Self-pay

## 2019-08-29 MED ORDER — ONETOUCH DELICA LANCETS 33G MISC: 12 refills | Status: AC

## 2019-08-29 NOTE — Telephone Encounter (Signed)
Rx sent. Pt overdue for physical. Can you call to schedule her at her earliest convenience please?

## 2019-08-29 NOTE — Telephone Encounter (Signed)
Patient called in to see if Dr. Larose Kells  Could send in a prescription for  [facility] LANCETS FINE MISC [id number]    Please send it to   [facility], [address] AT [address] [facility]  [place], [place] [address]  Phone:  [phone] Fax:  [phone]  DEA #:  [id number]

## 2019-08-30 DIAGNOSIS — M5136 Other intervertebral disc degeneration, lumbar region: Secondary | ICD-10-CM | POA: Diagnosis not present

## 2019-08-30 DIAGNOSIS — Z79891 Long term (current) use of opiate analgesic: Secondary | ICD-10-CM | POA: Diagnosis not present

## 2019-08-30 DIAGNOSIS — M48061 Spinal stenosis, lumbar region without neurogenic claudication: Secondary | ICD-10-CM | POA: Diagnosis not present

## 2019-09-14 DIAGNOSIS — M25551 Pain in right hip: Secondary | ICD-10-CM | POA: Diagnosis not present

## 2019-09-14 DIAGNOSIS — M25561 Pain in right knee: Secondary | ICD-10-CM | POA: Diagnosis not present

## 2019-09-20 DIAGNOSIS — M25551 Pain in right hip: Secondary | ICD-10-CM | POA: Diagnosis not present

## 2019-09-20 DIAGNOSIS — M25561 Pain in right knee: Secondary | ICD-10-CM | POA: Diagnosis not present

## 2019-09-25 ENCOUNTER — Other Ambulatory Visit: Payer: Self-pay

## 2019-09-25 MED ORDER — METFORMIN HCL 500 MG PO TABS: 750.0000 mg | ORAL_TABLET | Freq: Two times a day (BID) | ORAL | 0 refills | Status: DC

## 2019-10-03 DIAGNOSIS — G894 Chronic pain syndrome: Secondary | ICD-10-CM | POA: Diagnosis not present

## 2019-10-03 DIAGNOSIS — M5416 Radiculopathy, lumbar region: Secondary | ICD-10-CM | POA: Diagnosis not present

## 2019-10-23 ENCOUNTER — Other Ambulatory Visit: Payer: Self-pay

## 2019-10-23 MED ORDER — AMLODIPINE BESYLATE 5 MG PO TABS: 5.0000 mg | ORAL_TABLET | Freq: Every day | ORAL | 0 refills | Status: DC

## 2019-10-25 DIAGNOSIS — M5136 Other intervertebral disc degeneration, lumbar region: Secondary | ICD-10-CM | POA: Diagnosis not present

## 2019-10-28 ENCOUNTER — Other Ambulatory Visit: Payer: Self-pay | Admitting: Internal Medicine

## 2019-11-02 ENCOUNTER — Ambulatory Visit: Payer: Medicare PPO | Admitting: Internal Medicine

## 2019-11-05 ENCOUNTER — Other Ambulatory Visit: Payer: Self-pay

## 2019-11-05 ENCOUNTER — Ambulatory Visit: Payer: Medicare PPO | Admitting: Internal Medicine

## 2019-11-05 ENCOUNTER — Encounter: Payer: Self-pay | Admitting: Internal Medicine

## 2019-11-05 VITALS — BP 146/80 | HR 88 | Temp 97.2°F | Resp 18 | Ht 66.0 in | Wt 178.1 lb

## 2019-11-05 DIAGNOSIS — R634 Abnormal weight loss: Secondary | ICD-10-CM | POA: Diagnosis not present

## 2019-11-05 DIAGNOSIS — E1140 Type 2 diabetes mellitus with diabetic neuropathy, unspecified: Secondary | ICD-10-CM | POA: Diagnosis not present

## 2019-11-05 DIAGNOSIS — J9801 Acute bronchospasm: Secondary | ICD-10-CM

## 2019-11-05 DIAGNOSIS — I10 Essential (primary) hypertension: Secondary | ICD-10-CM

## 2019-11-05 MED ORDER — METFORMIN HCL 500 MG PO TABS: 750.0000 mg | ORAL_TABLET | Freq: Two times a day (BID) | ORAL | 1 refills | Status: DC

## 2019-11-05 MED ORDER — LISINOPRIL 20 MG PO TABS: 20.0000 mg | ORAL_TABLET | Freq: Every day | ORAL | 1 refills | Status: DC

## 2019-11-05 MED ORDER — FUROSEMIDE 20 MG PO TABS: 20.0000 mg | ORAL_TABLET | Freq: Every day | ORAL | 1 refills | Status: DC

## 2019-11-05 MED ORDER — AMLODIPINE BESYLATE 5 MG PO TABS: 5.0000 mg | ORAL_TABLET | Freq: Every day | ORAL | 1 refills | Status: DC

## 2019-11-05 NOTE — Patient Instructions (Addendum)
Please schedule Medicare Wellness with Glenard Haring.   Per our records you are due for an eye exam. Please contact your eye doctor to schedule an appointment. Please have them send copies of your office visit notes to [place]. Our fax number is [phone].  Continue checking your blood pressures BP GOAL is between 110/65 and  135/85. If it is consistently higher or lower, let me know     [place], [place] back for a checkup in 3 to 4 months        Please get your  blood work at the other [place] office located at :  [facility] in [place], across from [facility].  This is a walk-in facility, no appointment needed, go straight to the basement.    Hours of operation Monday to Friday  7:30 AM to 4 PM

## 2019-11-05 NOTE — Progress Notes (Signed)
Subjective:    Patient ID: Natalie [name], female    DOB: 06-Mar-1933, 86 y.o.   MRN: [id number]  DOS:  11/05/2019 Type of visit - description: Follow-up Today with talk about several issues: -HTN -Pain: Still has a number of problems including knee and hip pain, they are somewhat better with local injections. She also has some back pain, just had the injection but that is not helping too much. -DM  Also, reports that she sometimes feels flushed and sometimes she feels cold. They flushed feelings only at the face, denies fever chills or night sweats.  Also reports pain at the right side of the neck, only with certain head movements, no radiation to the arms.  BP Readings from Last 3 Encounters:  11/05/19 (!) 146/80  06/20/18 128/82  02/28/18 128/84   Wt Readings from Last 3 Encounters:  11/05/19 178 lb 2 oz (80.8 kg)  06/20/18 182 lb 2 oz (82.6 kg)  02/28/18 182 lb 4 oz (82.7 kg)     Review of Systems Noted some weight loss over the last several months, she reports no nausea vomiting.  No abdominal pain, specifically no postprandial abdominal pain.  No blood in the stools.  No anxiety or depression She admits that she is not eating much at nighttime, simply lack of appetite.  Past Medical History:  Diagnosis Date  . Back pain, chronic   . BCC (basal cell carcinoma of skin)    several, nose , 2012 L forehead  . Degeneration of lumbar intervertebral disc    Dr. Nelva Bush  . Diabetes mellitus   . HTN (hypertension)   . Hyperlipidemia   . Hyperparathyroidism   . Lumbago    Lumbar epidural steroid injection at L5-S1 midline by Dr. Nelva Bush  . Pelvic pain in female 2005    back and pelvic pain, s/p w-u, CT (-), u/s showed a thick endometrium, s/p Bx per gyn  . Ureteric obstruction    hydronephrosis,sees urology    Past Surgical History:  Procedure Laterality Date  . CYSTECTOMY  1989   thyriod  . LOBECTOMY  1989   s/p  RML lung lobectomy, biopsy showed precancerous  changes   . POLYPECTOMY     colon    Allergies as of 11/05/2019      Reactions   Cefixime    unknown   Cefprozil Nausea And Vomiting   Cefuroxime Axetil Nausea And Vomiting   Nitrofurantoin    unknown   Penicillins    unknown   Sulfonamide Derivatives    unknown      Medication List       Accurate as of November 05, 2019 11:59 PM. If you have any questions, ask your nurse or doctor.        STOP taking these medications   phenazopyridine 200 MG tablet Commonly known as: Pyridium Stopped by: Kathlene November, MD     TAKE these medications   acetaminophen 500 MG tablet Commonly known as: TYLENOL Take 1,250 mg by mouth every 6 (six) hours as needed for mild pain.   albuterol 108 (90 Base) MCG/ACT inhaler Commonly known as: VENTOLIN HFA Inhale 2 puffs into the lungs every 6 (six) hours as needed for wheezing.   amLODipine 5 MG tablet Commonly known as: NORVASC Take 1 tablet (5 mg total) by mouth daily.   augmented betamethasone dipropionate 0.05 % cream Commonly known as: DIPROLENE-AF Apply topically 2 (two) times daily.   azelastine 0.1 % nasal spray Commonly known as:  ASTELIN Place 2 sprays into both nostrils 2 (two) times daily.   BIOTIN PO Take by mouth.   calcium carbonate 500 MG chewable tablet Commonly known as: TUMS - dosed in mg elemental calcium Chew 1 tablet by mouth daily.   CINNAMON PO Take by mouth daily. Reported on 05/30/2015   EpiPen 2-Pak 0.3 mg/0.3 mL Soaj injection Generic drug: EPINEPHrine Reported on 11/27/2015   famotidine 20 MG tablet Commonly known as: PEPCID Take 20 mg by mouth 2 (two) times daily as needed.   fluticasone 110 MCG/ACT inhaler Commonly known as: FLOVENT HFA Inhale 1-2 puffs into the lungs 2 (two) times daily.   fluticasone 50 MCG/ACT nasal spray Commonly known as: FLONASE Place 2 sprays into the nose daily.   furosemide 20 MG tablet Commonly known as: LASIX Take 0.5 tablets (10 mg total) by mouth daily. What  changed: how much to take Changed by: Kathlene November, MD   HYDROcodone-acetaminophen 10-325 MG tablet Commonly known as: NORCO Take 1 tablet by mouth every 8 (eight) hours as needed.   hydrocortisone 25 MG suppository Commonly known as: ANUSOL-HC Place 1 suppository (25 mg total) rectally 2 (two) times daily as needed for hemorrhoids.   lisinopril 20 MG tablet Commonly known as: ZESTRIL Take 1 tablet (20 mg total) by mouth daily.   loratadine 10 MG tablet Commonly known as: CLARITIN Take 10 mg by mouth daily.   MAALOX ANTI-GAS PO Take by mouth as needed. Any anti-gas medication[tablet as needed].   metFORMIN 500 MG tablet Commonly known as: GLUCOPHAGE Take 1.5 tablets (750 mg total) by mouth 2 (two) times daily with a meal.   multivitamin,tx-minerals tablet Take 1 tablet by mouth daily.   naproxen sodium 220 MG tablet Commonly known as: ALEVE Take 220 mg by mouth daily.   NON FORMULARY ALLERGY INJECTIONS.   OneTouch Delica Lancets 07P Misc Check blood sugar once daily   OneTouch Ultra test strip Generic drug: glucose blood USE TO TEST BLOOD SUGAR ONCE DAILY          Objective:   Physical Exam Neck:     BP (!) 146/80 (BP Location: Left Arm, Patient Position: Sitting, Cuff Size: Normal)   Pulse 88   Temp (!) 97.2 F (36.2 C) (Temporal)   Resp 18   Ht 5\' 6"  (1.676 m)   Wt 178 lb 2 oz (80.8 kg)   SpO2 94%   BMI 28.75 kg/m  General:   Well developed, NAD, BMI noted. HEENT:  Normocephalic . Face symmetric, atraumatic Lungs:  CTA B Normal respiratory effort, no intercostal retractions, no accessory muscle use. Heart: RRR,  no murmur.  Lower extremities: Mild periankle edema bilaterally  Skin: Not pale. Not jaundice Neurologic:  alert & oriented X3.  Speech normal, gait appropriate for age and unassisted Psych--  Cognition and judgment appear intact.  Cooperative with normal attention span and concentration.  Behavior appropriate. No anxious or  depressed appearing.      Assessment     Assessment DM, complicated by neuropathy and callus HTN Hyperlipidemia, intolerant to medications , not checking labs Allergies, B-spasm, get shots----- sees Dr Donneta Romberg  Fatigue, chronic GU:  Hydronephrosis  dx ~ 2007, follow-up with serial USs. DX was right UPJ obstruction. Had a Lasix renogram 2013 ( R 46%, L 54%) Dr. Jasmine December, rx to RTC PRN.  [place] 11-2016 : no obstruction MSK: --DJD --Chronic back pain, dr Nelva Bush , he rx hydrocodone BCC H/o pelvic pain, 2005, w/u per gyn H/o hyperparathyroidism H/o RML  Lobectomy, 1989, bx precancerous cells ?  Heart murmur: Echocardiogram 04/2017 essentially normal  PLAN DM: Complicated by neuropathy, on Metformin, ambulatory CBGs in the 120s.  We will check a A1c. HTN: On amlodipine, Lasix (only half tablet daily due to excessive urination), lisinopril. We will check CMP, CBC. Allergies and bronchospasm: Refill Astelin, currently not needing inhalers. DJD: Multiple aches and pains including pain @ the neck  for few months without other symptoms, pain managed by Dr. Nelva Bush on hydrocodone and half naproxen daily. Weight loss: Modest weight loss over the last few months, reports that she feels flushed but no night sweats.  We are checking a CBC and also a TSH.  Reassess in few months RTC 3 to 4 months.   This visit occurred during the SARS-CoV-2 public health emergency.  Safety protocols were in place, including screening questions prior to the visit, additional usage of staff PPE, and extensive cleaning of exam room while observing appropriate contact time as indicated for disinfecting solutions.

## 2019-11-05 NOTE — Progress Notes (Signed)
Pre visit review using our clinic review tool, if applicable. No additional management support is needed unless otherwise documented below in the visit note. 

## 2019-11-06 NOTE — Assessment & Plan Note (Signed)
DM: Complicated by neuropathy, on Metformin, ambulatory CBGs in the 120s.  We will check a A1c. HTN: On amlodipine, Lasix (only half tablet daily due to excessive urination), lisinopril. We will check CMP, CBC. Allergies and bronchospasm: Refill Astelin, currently not needing inhalers. DJD: Multiple aches and pains including pain @ the neck  for few months without other symptoms, pain managed by Dr. Nelva Bush on hydrocodone and half naproxen daily. Weight loss: Modest weight loss over the last few months, reports that she feels flushed but no night sweats.  We are checking a CBC and also a TSH.  Reassess in few months RTC 3 to 4 months.

## 2019-11-07 ENCOUNTER — Other Ambulatory Visit (INDEPENDENT_AMBULATORY_CARE_PROVIDER_SITE_OTHER): Payer: Medicare PPO

## 2019-11-07 DIAGNOSIS — R634 Abnormal weight loss: Secondary | ICD-10-CM

## 2019-11-07 DIAGNOSIS — E1140 Type 2 diabetes mellitus with diabetic neuropathy, unspecified: Secondary | ICD-10-CM

## 2019-11-07 DIAGNOSIS — I10 Essential (primary) hypertension: Secondary | ICD-10-CM | POA: Diagnosis not present

## 2019-11-07 LAB — COMPREHENSIVE METABOLIC PANEL
ALT: 14 U/L (ref 0–35)
AST: 19 U/L (ref 0–37)
Albumin: 4.5 g/dL (ref 3.5–5.2)
Alkaline Phosphatase: 49 U/L (ref 39–117)
BUN: 13 mg/dL (ref 6–23)
CO2: 26 mEq/L (ref 19–32)
Calcium: 10 mg/dL (ref 8.4–10.5)
Chloride: 102 mEq/L (ref 96–112)
Creatinine, Ser: 0.69 mg/dL (ref 0.40–1.20)
GFR: 80.93 mL/min (ref >60.00)
Glucose, Bld: 89 mg/dL (ref 70–99)
Potassium: 4.4 mEq/L (ref 3.5–5.1)
Sodium: 134 mEq/L — ABNORMAL LOW (ref 135–145)
Total Bilirubin: 0.4 mg/dL (ref 0.2–1.2)
Total Protein: 7.3 g/dL (ref 6.0–8.3)

## 2019-11-07 LAB — CBC WITH DIFFERENTIAL/PLATELET
Basophils Absolute: 0.1 10*3/uL (ref 0.0–0.1)
Basophils Relative: 1 % (ref 0.0–3.0)
Eosinophils Absolute: 0.2 10*3/uL (ref 0.0–0.7)
Eosinophils Relative: 1.9 % (ref 0.0–5.0)
HCT: 38.2 % (ref 36.0–46.0)
Hemoglobin: 13 g/dL (ref 12.0–15.0)
Lymphocytes Relative: 38.4 % (ref 12.0–46.0)
Lymphs Abs: 3.4 10*3/uL (ref 0.7–4.0)
MCHC: 33.9 g/dL (ref 30.0–36.0)
MCV: 86.8 fl (ref 78.0–100.0)
Monocytes Absolute: 0.9 10*3/uL (ref 0.1–1.0)
Monocytes Relative: 9.9 % (ref 3.0–12.0)
Neutro Abs: 4.4 10*3/uL (ref 1.4–7.7)
Neutrophils Relative %: 48.8 % (ref 43.0–77.0)
Platelets: 286 10*3/uL (ref 150.0–400.0)
RBC: 4.4 Mil/uL (ref 3.87–5.11)
RDW: 14 % (ref 11.5–15.5)
WBC: 9 10*3/uL (ref 4.0–10.5)

## 2019-11-07 LAB — HEMOGLOBIN A1C: Hgb A1c MFr Bld: 6 % (ref 4.6–6.5)

## 2019-11-07 LAB — TSH: TSH: 1.71 u[IU]/mL (ref 0.35–4.50)

## 2019-11-16 ENCOUNTER — Telehealth: Payer: Self-pay | Admitting: Internal Medicine

## 2019-11-16 NOTE — Telephone Encounter (Signed)
Spoke w/ Pt- informed that we didn't collect urine at visit- she stated she left urine sample at [facility]. I informed that they may have tossed it because there was not orders in the chart. Pt verbalized understanding. I offered her an appt for Tuesday for irritation but she declines at this time.

## 2019-11-16 NOTE — Telephone Encounter (Signed)
Caller : Rukia  Call Back [phone]  Patient states that gave a urine sample while at [facility] Location, patient would like to know what results are. Explained to patient that we did not get a urine sample last visit.

## 2019-11-22 ENCOUNTER — Encounter: Payer: Self-pay | Admitting: Internal Medicine

## 2020-01-02 ENCOUNTER — Telehealth: Payer: Self-pay | Admitting: Internal Medicine

## 2020-01-02 MED ORDER — BLOOD GLUCOSE METER KIT: PACK | 0 refills | Status: AC

## 2020-01-02 NOTE — Telephone Encounter (Signed)
Caller : Presly  Call Back # [phone]  Patient states that Manuela Neptune would like patient to change her glucose test strip to another brand however, patient does not know what brand. Per patient her  pharmacy will need a new prescription.   Please Advise    [facility] [place], [address] [facility]  [address] [place], [place] [address]  Phone:  [phone] Fax:  [phone]  DEA #:  [id number]

## 2020-01-02 NOTE — Telephone Encounter (Signed)
Will fax generic glucometer and supplies Rx to [facility].

## 2020-02-27 ENCOUNTER — Ambulatory Visit: Payer: Medicare PPO

## 2020-03-05 ENCOUNTER — Other Ambulatory Visit: Payer: Self-pay | Admitting: Internal Medicine

## 2020-03-13 DIAGNOSIS — H5203 Hypermetropia, bilateral: Secondary | ICD-10-CM | POA: Diagnosis not present

## 2020-03-13 DIAGNOSIS — Z7984 Long term (current) use of oral hypoglycemic drugs: Secondary | ICD-10-CM | POA: Diagnosis not present

## 2020-03-13 DIAGNOSIS — H524 Presbyopia: Secondary | ICD-10-CM | POA: Diagnosis not present

## 2020-03-13 DIAGNOSIS — E119 Type 2 diabetes mellitus without complications: Secondary | ICD-10-CM | POA: Diagnosis not present

## 2020-03-13 DIAGNOSIS — H25813 Combined forms of age-related cataract, bilateral: Secondary | ICD-10-CM | POA: Diagnosis not present

## 2020-03-13 LAB — HM DIABETES EYE EXAM

## 2020-03-14 ENCOUNTER — Encounter: Payer: Self-pay | Admitting: Internal Medicine

## 2020-04-10 IMAGING — CT CT MAXILLOFACIAL W/O CM
4 of 11 series · 13 of 47 positions shown, 15 images · non-contrast
Comparison: Brain MRI 09/17/2013

CLINICAL DATA: Head trauma, minor. Right-sided facial abrasion.
Initial encounter.

EXAM:
CT HEAD WITHOUT CONTRAST
CT MAXILLOFACIAL WITHOUT CONTRAST
CT CERVICAL SPINE WITHOUT CONTRAST
TECHNIQUE: Multidetector CT imaging of the head, cervical spine, and
maxillofacial structures were performed using the standard protocol
without intravenous contrast. Multiplanar CT image reconstructions
of the cervical spine and maxillofacial structures were also
generated.

[Series 3: head bone · axial · 0.45mm/px · z∈[+1350,+1430]mm · 4 of 68 slices shown]
[im 14/68  bone]
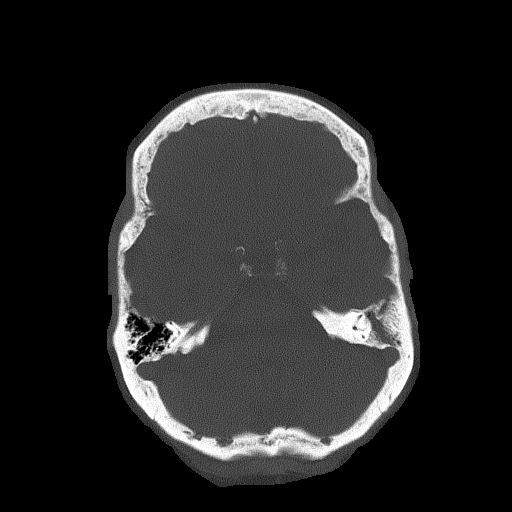
[im 27/68  bone]
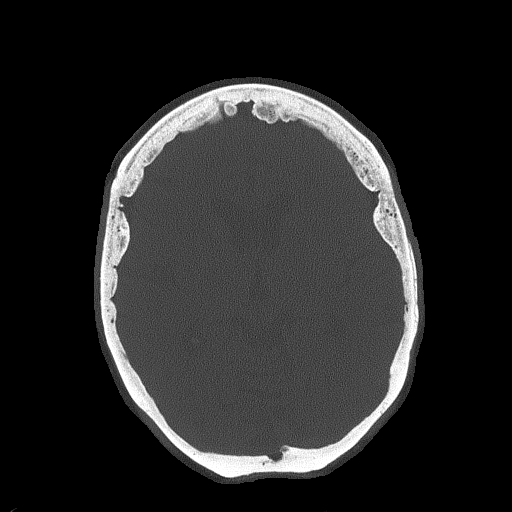
[im 41/68  bone]
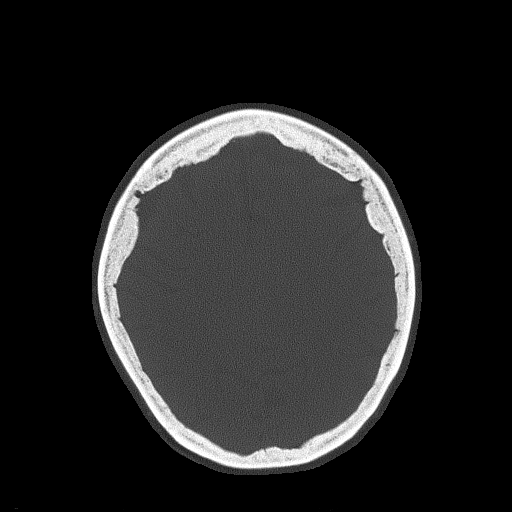
[im 54/68  bone]
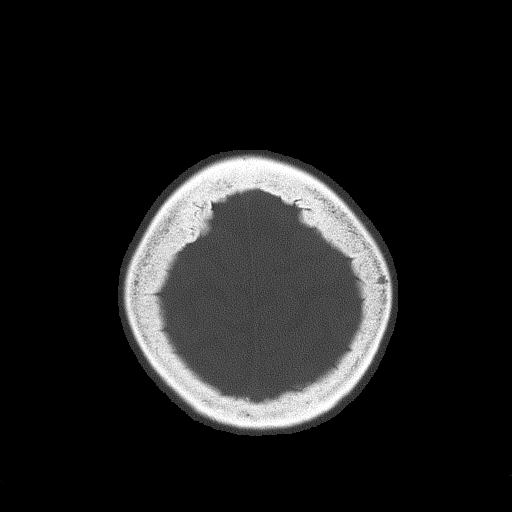

[Series 4: head coronal · coronal · 0.26mm/px · 2 of 60 slices shown]
[im 20/60  bone]
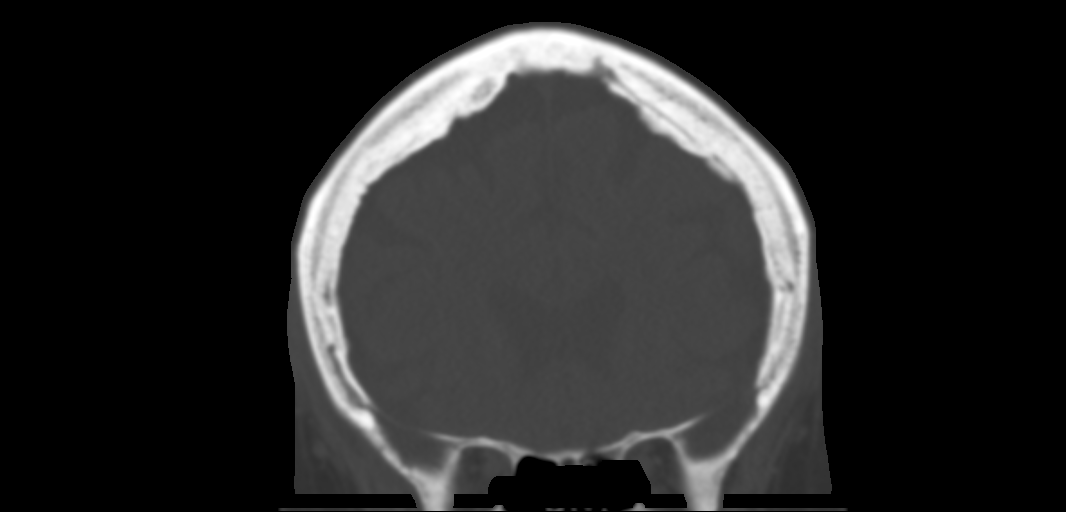
[im 40/60  bone]
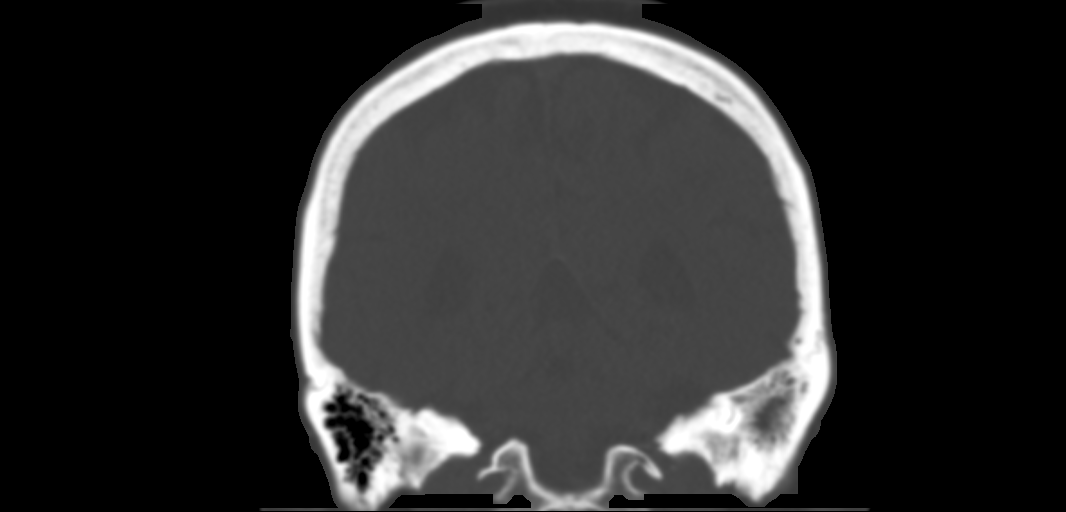

[Series 11: sagittal soft · sagittal · 0.28mm/px · 1 of 80 slices shown]
[im 40/80  bone]
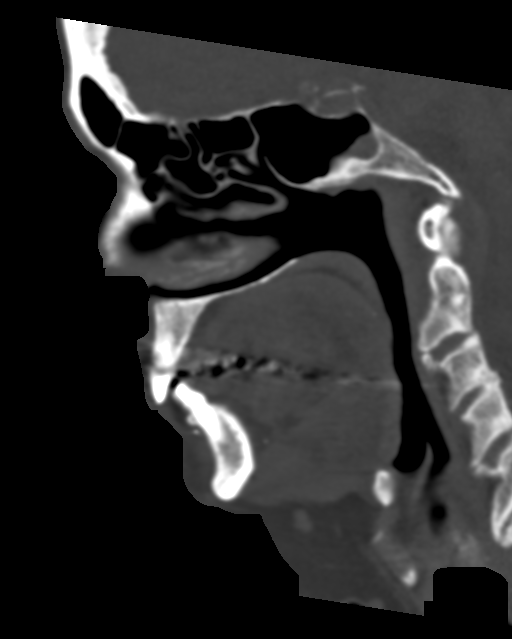

[Series 18: orthogonal axials · axial · 0.23mm/px · z∈[+1176,+1304]mm · 6 of 99 slices shown, 8 images]
[im 15/99  brain]
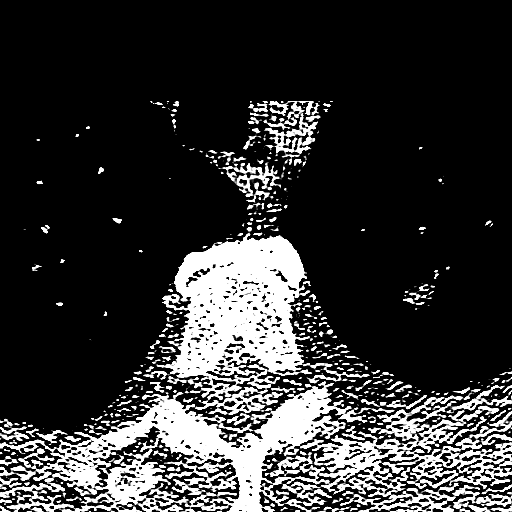
[im 15/99  bone]
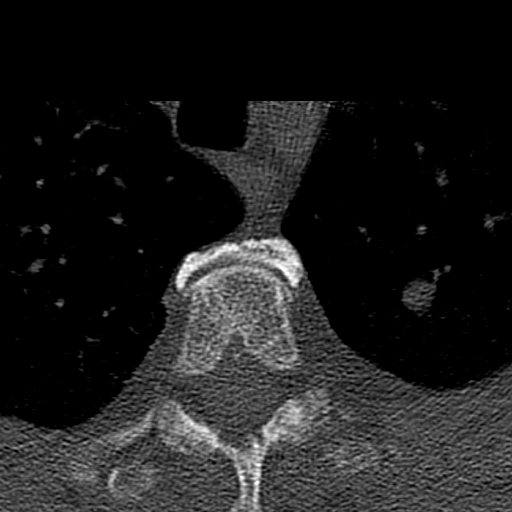
[im 29/99  bone]
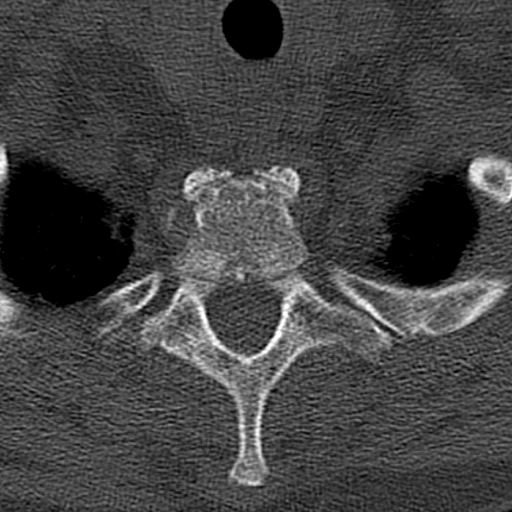
[im 43/99  bone]
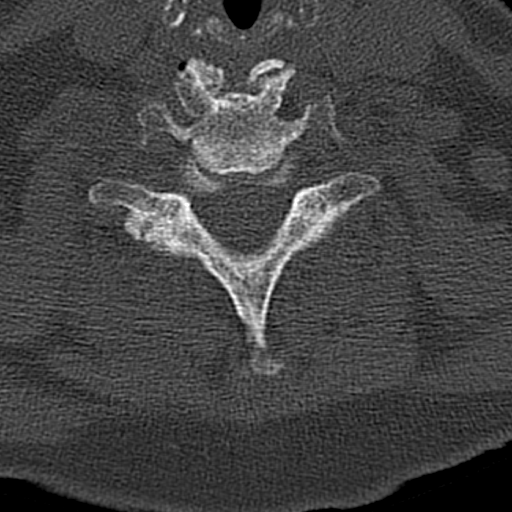
[im 57/99  bone]
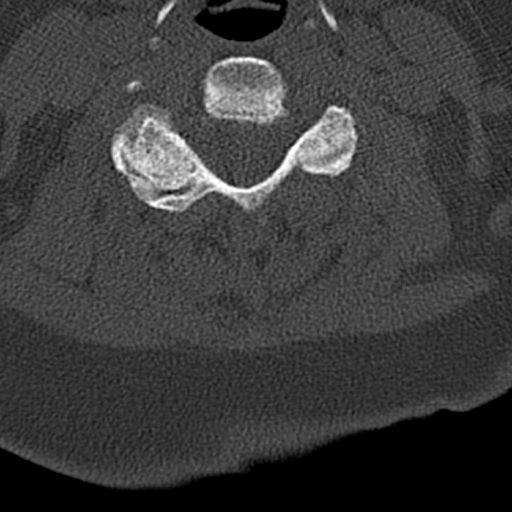
[im 71/99  brain]
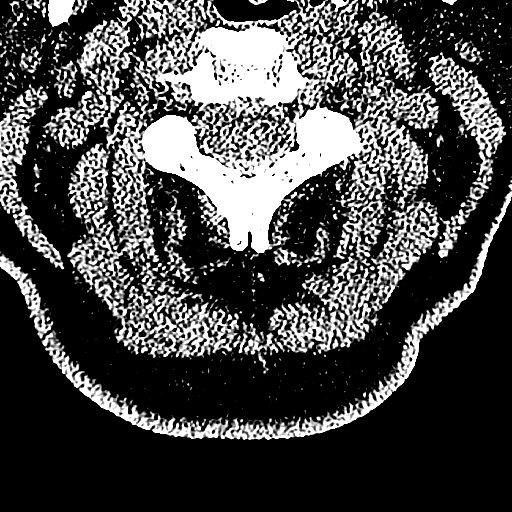
[im 71/99  bone]
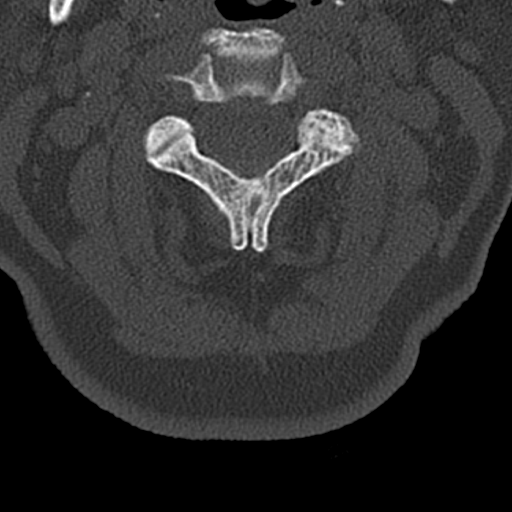
[im 85/99  bone]
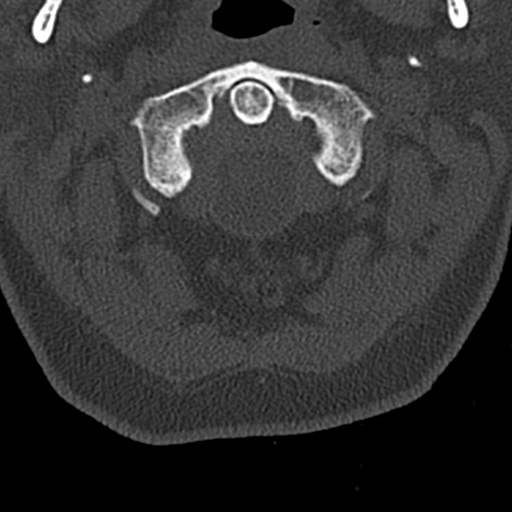

[13 of 47 positions shown; findings below may reference images not displayed]

FINDINGS: CT HEAD FINDINGS

Brain: No evidence of acute infarction, hemorrhage, hydrocephalus,
extra-axial collection or mass lesion/mass effect. Generalized
atrophy

Vascular: Atherosclerotic calcification

Skull: Hyperostosis interna.  Negative for calvarial fracture

CT MAXILLOFACIAL FINDINGS

Osseous: Blowout fracture of the right orbital floor, traversing the
infraorbital canal with fat herniation and mild descent and
distortion of the right inferior rectus. No orbital hematoma

Orbits: Blowout fracture as described above.

Sinuses: Right maxillary hemosinus related to the above. Left
mastoid opacification, also seen by MRI in 0107.

Soft tissues: History of facial abrasion.  No opaque foreign body.

CT CERVICAL SPINE FINDINGS

Alignment: Mild C6-7 anterolisthesis.

Skull base and vertebrae: Negative for fracture

Soft tissues and spinal canal: No prevertebral fluid or swelling. No
visible canal hematoma.

Disc levels: Generalized spondylosis and degenerative facet
spurring. No evidence of canal or foraminal impingement.

Upper chest: No evidence of injury. 15 mm left upper lobe smooth
nodule.
IMPRESSION: 1. Blowout fracture of the right orbital floor with fat herniation
and inferior rectus deformity.
2. No evidence of intracranial injury or cervical spine fracture.
3. 15 mm left upper lobe pulmonary nodule. In retrospect, this is
stable on chest x-rays dating back to 4281 and thus considered
benign.

## 2020-04-22 DIAGNOSIS — R2689 Other abnormalities of gait and mobility: Secondary | ICD-10-CM | POA: Diagnosis not present

## 2020-04-22 DIAGNOSIS — G894 Chronic pain syndrome: Secondary | ICD-10-CM | POA: Diagnosis not present

## 2020-05-24 ENCOUNTER — Other Ambulatory Visit: Payer: Self-pay | Admitting: Internal Medicine

## 2020-05-26 ENCOUNTER — Other Ambulatory Visit: Payer: Self-pay | Admitting: Internal Medicine

## 2020-06-17 ENCOUNTER — Encounter: Payer: Self-pay | Admitting: Internal Medicine

## 2020-06-17 ENCOUNTER — Ambulatory Visit (INDEPENDENT_AMBULATORY_CARE_PROVIDER_SITE_OTHER): Payer: Medicare PPO | Admitting: Internal Medicine

## 2020-06-17 VITALS — BP 156/86 | Ht 66.0 in

## 2020-06-17 DIAGNOSIS — I10 Essential (primary) hypertension: Secondary | ICD-10-CM

## 2020-06-17 DIAGNOSIS — E1140 Type 2 diabetes mellitus with diabetic neuropathy, unspecified: Secondary | ICD-10-CM

## 2020-06-17 MED ORDER — AMLODIPINE BESYLATE 10 MG PO TABS: 10.0000 mg | ORAL_TABLET | Freq: Every day | ORAL | 1 refills | Status: DC

## 2020-06-17 NOTE — Progress Notes (Signed)
Subjective:    Patient ID: Natalie [name], female    DOB: 1933-05-11, 87 y.o.   MRN: [id number]  DOS:  06/17/2020 Type of visit - description: Virtual Visit via Telephone    I connected with above mentioned patient  by telephone and verified that I am speaking with the correct person using two identifiers.  THIS ENCOUNTER IS A VIRTUAL VISIT DUE TO COVID-19 - PATIENT WAS NOT SEEN IN THE OFFICE. PATIENT HAS CONSENTED TO VIRTUAL VISIT / TELEMEDICINE VISIT   Location of patient: home  Location of provider: office  Persons participating in the virtual visit: patient, provider   I discussed the limitations, risks, security and privacy concerns of performing an evaluation and management service by telephone and the availability of in person appointments. I also discussed with the patient that there may be a patient responsible charge related to this service. The patient expressed understanding and agreed to proceed.  F/U In general feels well. The patient has decreased furosemide to essentially one quarter of tablet a day because incontinent issues, could not reach the bathroom in time and is using a pad.  Denies dysuria, hematuria, fever or chills. We review her ambulatory BPs and ambulatory CBGs.  Review of Systems See above   Past Medical History:  Diagnosis Date  . Back pain, chronic   . BCC (basal cell carcinoma of skin)    several, nose , 2012 L forehead  . Degeneration of lumbar intervertebral disc    Dr. Nelva Bush  . Diabetes mellitus   . HTN (hypertension)   . Hyperlipidemia   . Hyperparathyroidism   . Lumbago    Lumbar epidural steroid injection at L5-S1 midline by Dr. Nelva Bush  . Pelvic pain in female 2005    back and pelvic pain, s/p w-u, CT (-), u/s showed a thick endometrium, s/p Bx per gyn  . Ureteric obstruction    hydronephrosis,sees urology    Past Surgical History:  Procedure Laterality Date  . CYSTECTOMY  1989   thyriod  . LOBECTOMY  1989   s/p  RML lung  lobectomy, biopsy showed precancerous changes   . POLYPECTOMY     colon    Allergies as of 06/17/2020      Reactions   Cefixime    unknown   Cefprozil Nausea And Vomiting   Cefuroxime Axetil Nausea And Vomiting   Nitrofurantoin    unknown   Penicillins    unknown   Sulfonamide Derivatives    unknown      Medication List       Accurate as of June 17, 2020 11:59 PM. If you have any questions, ask your nurse or doctor.        STOP taking these medications   augmented betamethasone dipropionate 0.05 % cream Commonly known as: DIPROLENE-AF Stopped by: Kathlene November, MD   CINNAMON PO Stopped by: Kathlene November, MD   famotidine 20 MG tablet Commonly known as: PEPCID Stopped by: Kathlene November, MD   furosemide 20 MG tablet Commonly known as: LASIX Stopped by: Kathlene November, MD   hydrocortisone 25 MG suppository Commonly known as: ANUSOL-HC Stopped by: Kathlene November, MD   NON FORMULARY Stopped by: Kathlene November, MD     TAKE these medications   acetaminophen 500 MG tablet Commonly known as: TYLENOL Take 1,250 mg by mouth every 6 (six) hours as needed for mild pain.   albuterol 108 (90 Base) MCG/ACT inhaler Commonly known as: VENTOLIN HFA Inhale 2 puffs into the lungs every  6 (six) hours as needed for wheezing.   amLODipine 10 MG tablet Commonly known as: NORVASC Take 1 tablet (10 mg total) by mouth daily. What changed:   medication strength  how much to take Changed by: Kathlene November, MD   azelastine 0.1 % nasal spray Commonly known as: ASTELIN Place 2 sprays into both nostrils 2 (two) times daily.   BIOTIN PO Take by mouth.   blood glucose meter kit and supplies Dispense based on patient and insurance preference. Check blood sugars once daily   calcium carbonate 500 MG chewable tablet Commonly known as: TUMS - dosed in mg elemental calcium Chew 1 tablet by mouth daily.   EpiPen 2-Pak 0.3 mg/0.3 mL Soaj injection Generic drug: EPINEPHrine Reported on 11/27/2015   fluticasone  110 MCG/ACT inhaler Commonly known as: FLOVENT HFA Inhale 1-2 puffs into the lungs 2 (two) times daily.   fluticasone 50 MCG/ACT nasal spray Commonly known as: FLONASE Place 2 sprays into the nose daily.   HYDROcodone-acetaminophen 10-325 MG tablet Commonly known as: NORCO Take 1 tablet by mouth every 8 (eight) hours as needed.   lisinopril 20 MG tablet Commonly known as: ZESTRIL Take 1 tablet (20 mg total) by mouth daily.   loratadine 10 MG tablet Commonly known as: CLARITIN Take 10 mg by mouth daily.   MAALOX ANTI-GAS PO Take by mouth as needed. Any anti-gas medication[tablet as needed].   metFORMIN 500 MG tablet Commonly known as: GLUCOPHAGE Take 1.5 tablets (750 mg total) by mouth 2 (two) times daily with a meal.   multivitamin,tx-minerals tablet Take 1 tablet by mouth daily.   naproxen sodium 220 MG tablet Commonly known as: ALEVE Take 220 mg by mouth daily.   OneTouch Delica Lancets 93T Misc Check blood sugar once daily   OneTouch Ultra test strip Generic drug: glucose blood USE TO TEST BLOOD SUGAR ONCE DAILY          Objective:   Physical Exam BP (!) 156/86   Ht _0  (1.676 m)   BMI 28.75 kg/m     This is a virtual telephone visit, she sounded well, alert oriented x3, no distress  Assessment    Assessment DM, complicated by neuropathy and callus HTN Hyperlipidemia, intolerant to medications , not checking labs Allergies, B-spasm, get shots----- sees Dr Donneta Romberg  Fatigue, chronic GU:  Hydronephrosis  dx ~ 2007, follow-up with serial USs. DX was right UPJ obstruction. Had a Lasix renogram 2013 ( R 46%, L 54%) Dr. Jasmine December, rx to RTC PRN.  [place] 11-2016 : no obstruction MSK: --DJD --Chronic back pain, dr Nelva Bush , he rx hydrocodone BCC H/o pelvic pain, 2005, w/u per gyn H/o hyperparathyroidism H/o RML Lobectomy, 1989, bx precancerous cells ?  Heart murmur: Echocardiogram 04/2017 essentially normal  PLAN DM: Currently on Metformin, ambulatory  CBGs in the 120s, check A1c HTN: Currently on lisinopril 20 mg, amlodipine 5 mg, Lasix. Because of LUTS has decreased Lasix to 5 mg daily. BP today slightly elevated typically when she checks BPs are in the 150s. Plan: DC Lasix (although I am not sure if that is the culprit of her LUTS) Increase amlodipine to 10 mg and monitor BPs Allergies, reactive airway disease: Doing well, no using inhalers Preventive care: [place] x3, had a flu shot Labs to be done at the [place] office:BMP, A1c RTC in person 2 months     I discussed the assessment and treatment plan with the patient. The patient was provided an opportunity to ask questions and  all were answered. The patient agreed with the plan and demonstrated an understanding of the instructions.   The patient was advised to call back or seek an in-person evaluation if the symptoms worsen or if the condition fails to improve as anticipated.  I provided 23 minutes of non-face-to-face time during this encounter.  Kathlene November, MD

## 2020-06-17 NOTE — Progress Notes (Signed)
Pre visit review using our clinic review tool, if applicable. No additional management support is needed unless otherwise documented below in the visit note. 

## 2020-06-18 NOTE — Assessment & Plan Note (Signed)
DM: Currently on Metformin, ambulatory CBGs in the 120s, check A1c HTN: Currently on lisinopril 20 mg, amlodipine 5 mg, Lasix. Because of LUTS has decreased Lasix to 5 mg daily. BP today slightly elevated typically when she checks BPs are in the 150s. Plan: DC Lasix (although I am not sure if that is the culprit of her LUTS) Increase amlodipine to 10 mg and monitor BPs Allergies, reactive airway disease: Doing well, no using inhalers Preventive care: [place] x3, had a flu shot Labs to be done at the [place] office:BMP, A1c RTC in person 2 months

## 2020-06-19 ENCOUNTER — Other Ambulatory Visit (INDEPENDENT_AMBULATORY_CARE_PROVIDER_SITE_OTHER): Payer: Medicare PPO

## 2020-06-19 ENCOUNTER — Telehealth: Payer: Self-pay

## 2020-06-19 DIAGNOSIS — E1140 Type 2 diabetes mellitus with diabetic neuropathy, unspecified: Secondary | ICD-10-CM

## 2020-06-19 DIAGNOSIS — I10 Essential (primary) hypertension: Secondary | ICD-10-CM

## 2020-06-19 DIAGNOSIS — R399 Unspecified symptoms and signs involving the genitourinary system: Secondary | ICD-10-CM

## 2020-06-19 LAB — BASIC METABOLIC PANEL
BUN: 13 mg/dL (ref 6–23)
CO2: 30 mEq/L (ref 19–32)
Calcium: 10.7 mg/dL — ABNORMAL HIGH (ref 8.4–10.5)
Chloride: 100 mEq/L (ref 96–112)
Creatinine, Ser: 0.68 mg/dL (ref 0.40–1.20)
GFR: 79.5 mL/min (ref >60.00)
Glucose, Bld: 110 mg/dL — ABNORMAL HIGH (ref 70–99)
Potassium: 4.3 mEq/L (ref 3.5–5.1)
Sodium: 135 mEq/L (ref 135–145)

## 2020-06-19 LAB — HEMOGLOBIN A1C: Hgb A1c MFr Bld: 6.2 % (ref 4.6–6.5)

## 2020-06-19 NOTE — Telephone Encounter (Signed)
I did not request a urine test but the patient did report some urinary symptoms. Please order UA, urine culture.  DX LUTS

## 2020-06-19 NOTE — Telephone Encounter (Signed)
[facility] called stating patient mentioned a urine sample , urine was collected, but they need orders dropped.

## 2020-06-20 DIAGNOSIS — R399 Unspecified symptoms and signs involving the genitourinary system: Secondary | ICD-10-CM | POA: Diagnosis not present

## 2020-06-20 LAB — URINALYSIS, ROUTINE W REFLEX MICROSCOPIC
Bilirubin Urine: NEGATIVE
Hgb urine dipstick: NEGATIVE
Ketones, ur: NEGATIVE
Nitrite: POSITIVE — AB
RBC / HPF: NONE SEEN (ref 0–?)
Specific Gravity, Urine: 1.02 (ref 1.000–1.030)
Total Protein, Urine: NEGATIVE
Urine Glucose: NEGATIVE
Urobilinogen, UA: 0.2 (ref 0.0–1.0)
pH: 6 (ref 5.0–8.0)

## 2020-06-20 NOTE — Addendum Note (Signed)
Addended byDamita Dunnings D on: 06/20/2020 07:36 AM   Modules accepted: Orders

## 2020-06-20 NOTE — Telephone Encounter (Signed)
Orders placed.

## 2020-06-22 LAB — URINE CULTURE
MICRO NUMBER:: 11496822
SPECIMEN QUALITY:: ADEQUATE

## 2020-06-23 ENCOUNTER — Other Ambulatory Visit: Payer: Self-pay | Admitting: Internal Medicine

## 2020-06-23 MED ORDER — AMLODIPINE BESYLATE 10 MG PO TABS: 10.0000 mg | ORAL_TABLET | Freq: Every day | ORAL | 1 refills | Status: DC

## 2020-06-23 MED ORDER — CIPROFLOXACIN HCL 500 MG PO TABS: 500.0000 mg | ORAL_TABLET | Freq: Two times a day (BID) | ORAL | 0 refills | Status: DC

## 2020-06-24 ENCOUNTER — Telehealth: Payer: Self-pay | Admitting: Internal Medicine

## 2020-06-24 NOTE — Telephone Encounter (Signed)
Okay to go back on amlodipine 5 mg Restart Lasix, previously she was taking 20 mg half tablet daily.  That is okay as long as her blood pressure is appropriate. Please edit the medication list

## 2020-06-24 NOTE — Telephone Encounter (Signed)
Patient is requesting her latest lab results be mailed to her home address.

## 2020-06-24 NOTE — Telephone Encounter (Signed)
Please advise 

## 2020-06-24 NOTE — Telephone Encounter (Signed)
Patient is requesting to cut amlodipine back to 5MG , and restart furosemide back . Patient believes this is a better combination for her swelling of ankles. Patient states she woke up with swelling in ankles flapped over her shoes.   Please advise

## 2020-06-24 NOTE — Telephone Encounter (Signed)
Spoke w/ Pt- informed of recommendations. Pt verbalized understanding. Med list updated. She informed that she does not currently need refills on either meds.

## 2020-06-24 NOTE — Telephone Encounter (Signed)
Results mailed 

## 2020-07-14 ENCOUNTER — Telehealth (INDEPENDENT_AMBULATORY_CARE_PROVIDER_SITE_OTHER): Payer: Medicare PPO | Admitting: Internal Medicine

## 2020-07-14 ENCOUNTER — Other Ambulatory Visit: Payer: Self-pay

## 2020-07-14 ENCOUNTER — Encounter: Payer: Self-pay | Admitting: Internal Medicine

## 2020-07-14 VITALS — Ht 66.0 in | Wt 176.0 lb

## 2020-07-14 DIAGNOSIS — R399 Unspecified symptoms and signs involving the genitourinary system: Secondary | ICD-10-CM

## 2020-07-14 DIAGNOSIS — I10 Essential (primary) hypertension: Secondary | ICD-10-CM

## 2020-07-14 DIAGNOSIS — E1140 Type 2 diabetes mellitus with diabetic neuropathy, unspecified: Secondary | ICD-10-CM | POA: Diagnosis not present

## 2020-07-14 DIAGNOSIS — N390 Urinary tract infection, site not specified: Secondary | ICD-10-CM | POA: Diagnosis not present

## 2020-07-14 NOTE — Progress Notes (Signed)
Subjective:    Patient ID: Natalie [name], female    DOB: [birth year], 87 y.o.   MRN: [id number]  DOS:  07/14/2020 Type of visit - description: Virtual Visit via Telephone    I connected with above mentioned patient  by telephone and verified that I am speaking with the correct person using two identifiers.  THIS ENCOUNTER IS A VIRTUAL VISIT DUE TO COVID-19 - PATIENT WAS NOT SEEN IN THE OFFICE. PATIENT HAS CONSENTED TO VIRTUAL VISIT / TELEMEDICINE VISIT   Location of patient: home  Location of provider: office  Persons participating in the virtual visit: patient, provider   I discussed the limitations, risks, security and privacy concerns of performing an evaluation and management service by telephone and the availability of in person appointments. I also discussed with the patient that there may be a patient responsible charge related to this service. The patient expressed understanding and agreed to proceed.  Acute, several concerns Continue with LUTS, some dysuria, some urine "odor". Denies fever chills.  No nausea or vomiting.  No lower abdominal pain.  No gross hematuria.  DM: CBGs used to be typically in the 119- 120s, here lately they have decreased today 85, 90, etc.  No symptoms.  Ambulatory BPs in the 150s in the morning, then she takes lisinopril and Lasix and the rest of the day are in the 140s.     Review of Systems See above   Past Medical History:  Diagnosis Date  . Back pain, chronic   . BCC (basal cell carcinoma of skin)    several, nose , 2012 L forehead  . Degeneration of lumbar intervertebral disc    Dr. Nelva Bush  . Diabetes mellitus   . HTN (hypertension)   . Hyperlipidemia   . Hyperparathyroidism   . Lumbago    Lumbar epidural steroid injection at L5-S1 midline by Dr. Nelva Bush  . Pelvic pain in female 2005    back and pelvic pain, s/p w-u, CT (-), u/s showed a thick endometrium, s/p Bx per gyn  . Ureteric obstruction    hydronephrosis,sees urology     Past Surgical History:  Procedure Laterality Date  . CYSTECTOMY  1989   thyriod  . LOBECTOMY  1989   s/p  RML lung lobectomy, biopsy showed precancerous changes   . POLYPECTOMY     colon    Allergies as of 07/14/2020      Reactions   Cefixime    unknown   Cefprozil Nausea And Vomiting   Cefuroxime Axetil Nausea And Vomiting   Nitrofurantoin    unknown   Penicillins    unknown   Sulfonamide Derivatives    unknown      Medication List       Accurate as of July 14, 2020 11:06 AM. If you have any questions, ask your nurse or doctor.        acetaminophen 500 MG tablet Commonly known as: TYLENOL Take 1,250 mg by mouth every 6 (six) hours as needed for mild pain.   albuterol 108 (90 Base) MCG/ACT inhaler Commonly known as: VENTOLIN HFA Inhale 2 puffs into the lungs every 6 (six) hours as needed for wheezing.   amLODipine 5 MG tablet Commonly known as: NORVASC Take 1 tablet (5 mg total) by mouth daily.   azelastine 0.1 % nasal spray Commonly known as: ASTELIN Place 2 sprays into both nostrils 2 (two) times daily.   BIOTIN PO Take by mouth.   blood glucose meter kit and supplies  Dispense based on patient and insurance preference. Check blood sugars once daily   calcium carbonate 500 MG chewable tablet Commonly known as: TUMS - dosed in mg elemental calcium Chew 1 tablet by mouth daily.   ciprofloxacin 500 MG tablet Commonly known as: Cipro Take 1 tablet (500 mg total) by mouth 2 (two) times daily.   CRANBERRY PO Take by mouth.   EpiPen 2-Pak 0.3 mg/0.3 mL Soaj injection Generic drug: EPINEPHrine Reported on 11/27/2015   fluticasone 110 MCG/ACT inhaler Commonly known as: FLOVENT HFA Inhale 1-2 puffs into the lungs 2 (two) times daily.   fluticasone 50 MCG/ACT nasal spray Commonly known as: FLONASE Place 2 sprays into the nose daily.   furosemide 20 MG tablet Commonly known as: LASIX Take 0.5 tablets (10 mg total) by mouth daily.    HYDROcodone-acetaminophen 10-325 MG tablet Commonly known as: NORCO Take 1 tablet by mouth every 8 (eight) hours as needed.   lisinopril 20 MG tablet Commonly known as: ZESTRIL Take 1 tablet (20 mg total) by mouth daily.   loratadine 10 MG tablet Commonly known as: CLARITIN Take 10 mg by mouth daily.   MAALOX ANTI-GAS PO Take by mouth as needed. Any anti-gas medication[tablet as needed].   metFORMIN 500 MG tablet Commonly known as: GLUCOPHAGE Take 1.5 tablets (750 mg total) by mouth 2 (two) times daily with a meal.   multivitamin,tx-minerals tablet Take 1 tablet by mouth daily.   naproxen sodium 220 MG tablet Commonly known as: ALEVE Take 220 mg by mouth daily.   OneTouch Delica Lancets 06Y Misc Check blood sugar once daily   OneTouch Ultra test strip Generic drug: glucose blood USE TO TEST BLOOD SUGAR ONCE DAILY          Objective:   Physical Exam Ht _0  (1.676 m)   Wt 176 lb (79.8 kg)   BMI 28.41 kg/m  This is a telephone evaluation, sounded well, alert oriented x3    Assessment     Assessment DM, complicated by neuropathy and callus HTN Hyperlipidemia, intolerant to medications , not checking labs Allergies, B-spasm, get shots----- sees Dr Donneta Romberg  Fatigue, chronic GU:  Hydronephrosis  dx ~ 2007, follow-up with serial USs. DX was right UPJ obstruction. Had a Lasix renogram 2013 ( R 46%, L 54%) Dr. Jasmine December, rx to RTC PRN.  [place] 11-2016 : no obstruction MSK: --DJD --Chronic back pain, dr Nelva Bush , he rx hydrocodone BCC H/o pelvic pain, 2005, w/u per gyn H/o hyperparathyroidism H/o RML Lobectomy, 1989, bx precancerous cells ?  Heart murmur: Echocardiogram 04/2017 essentially normal  PLAN LUTS: As described above, this is a on and off issue but here lately is worse.  She had E. coli UTI 06/20/2020, treated with Cipro.  Plan: UA urine culture. DM: CBGs lately have decreased, she self decrease Metformin 554m from 1.5 BID to 1 tablet B.I.D. I think that  is okay.  Continue monitoring CBGs, unclear why CBGs are in the low side, she admits she is not eating as much as before HTN: Ambulatory BPs and in the morning in the 150s, after lisinopril and Lasix 10 mg, BP drops  to the 140s.  She also takes amlodipine at night.  Recommend to try Lasix 10 mg BID to achieve a even better control. Back pain: Chronic issue, slightly worse lately?  No severe symptoms, observation     I discussed the assessment and treatment plan with the patient. The patient was provided an opportunity to ask questions and all were  answered. The patient agreed with the plan and demonstrated an understanding of the instructions.   The patient was advised to call back or seek an in-person evaluation if the symptoms worsen or if the condition fails to improve as anticipated.  I provided 20 minutes of non-face-to-face time during this encounter.  Kathlene November, MD

## 2020-07-15 ENCOUNTER — Other Ambulatory Visit: Payer: Self-pay | Admitting: Internal Medicine

## 2020-07-15 ENCOUNTER — Other Ambulatory Visit (INDEPENDENT_AMBULATORY_CARE_PROVIDER_SITE_OTHER): Payer: Medicare PPO

## 2020-07-15 DIAGNOSIS — N390 Urinary tract infection, site not specified: Secondary | ICD-10-CM | POA: Diagnosis not present

## 2020-07-15 LAB — URINALYSIS, ROUTINE W REFLEX MICROSCOPIC
Bilirubin Urine: NEGATIVE
Hgb urine dipstick: NEGATIVE
Ketones, ur: NEGATIVE
Leukocytes,Ua: NEGATIVE
Nitrite: NEGATIVE
RBC / HPF: NONE SEEN (ref 0–?)
Specific Gravity, Urine: 1.015 (ref 1.000–1.030)
Total Protein, Urine: NEGATIVE
Urine Glucose: NEGATIVE
Urobilinogen, UA: 0.2 (ref 0.0–1.0)
pH: 5.5 (ref 5.0–8.0)

## 2020-07-15 NOTE — Assessment & Plan Note (Signed)
LUTS: As described above, this is a on and off issue but here lately is worse.  She had E. coli UTI 06/20/2020, treated with Cipro.  Plan: UA urine culture. DM: CBGs lately have decreased, she self decrease Metformin 500mg  from 1.5 BID to 1 tablet B.I.D. I think that is okay.  Continue monitoring CBGs, unclear why CBGs are in the low side, she admits she is not eating as much as before HTN: Ambulatory BPs and in the morning in the 150s, after lisinopril and Lasix 10 mg, BP drops  to the 140s.  She also takes amlodipine at night.  Recommend to try Lasix 10 mg BID to achieve a even better control. Back pain: Chronic issue, slightly worse lately?  No severe symptoms, observation

## 2020-07-16 LAB — URINE CULTURE
MICRO NUMBER:: 11592319
Result:: NO GROWTH
SPECIMEN QUALITY:: ADEQUATE

## 2020-07-29 ENCOUNTER — Emergency Department (HOSPITAL_BASED_OUTPATIENT_CLINIC_OR_DEPARTMENT_OTHER): Payer: Medicare PPO | Admitting: Radiology

## 2020-07-29 ENCOUNTER — Other Ambulatory Visit: Payer: Self-pay

## 2020-07-29 ENCOUNTER — Emergency Department (HOSPITAL_BASED_OUTPATIENT_CLINIC_OR_DEPARTMENT_OTHER)
Admission: EM | Admit: 2020-07-29 | Discharge: 2020-07-29 | Disposition: A | Discharge status: Home / Self Care | Payer: Medicare PPO | Attending: Emergency Medicine | Admitting: Emergency Medicine

## 2020-07-29 ENCOUNTER — Emergency Department (HOSPITAL_BASED_OUTPATIENT_CLINIC_OR_DEPARTMENT_OTHER): Payer: Medicare PPO

## 2020-07-29 DIAGNOSIS — M542 Cervicalgia: Secondary | ICD-10-CM | POA: Insufficient documentation

## 2020-07-29 DIAGNOSIS — S0003XA Contusion of scalp, initial encounter: Secondary | ICD-10-CM | POA: Insufficient documentation

## 2020-07-29 DIAGNOSIS — S5001XA Contusion of right elbow, initial encounter: Secondary | ICD-10-CM | POA: Insufficient documentation

## 2020-07-29 DIAGNOSIS — W010XXA Fall on same level from slipping, tripping and stumbling without subsequent striking against object, initial encounter: Secondary | ICD-10-CM

## 2020-07-29 DIAGNOSIS — W01198A Fall on same level from slipping, tripping and stumbling with subsequent striking against other object, initial encounter: Secondary | ICD-10-CM | POA: Diagnosis not present

## 2020-07-29 DIAGNOSIS — Y93G3 Activity, cooking and baking: Secondary | ICD-10-CM | POA: Diagnosis not present

## 2020-07-29 DIAGNOSIS — Z79899 Other long term (current) drug therapy: Secondary | ICD-10-CM | POA: Diagnosis not present

## 2020-07-29 DIAGNOSIS — I10 Essential (primary) hypertension: Secondary | ICD-10-CM | POA: Insufficient documentation

## 2020-07-29 DIAGNOSIS — E785 Hyperlipidemia, unspecified: Secondary | ICD-10-CM | POA: Insufficient documentation

## 2020-07-29 DIAGNOSIS — S0990XA Unspecified injury of head, initial encounter: Secondary | ICD-10-CM | POA: Diagnosis present

## 2020-07-29 DIAGNOSIS — Z7984 Long term (current) use of oral hypoglycemic drugs: Secondary | ICD-10-CM | POA: Insufficient documentation

## 2020-07-29 DIAGNOSIS — E1169 Type 2 diabetes mellitus with other specified complication: Secondary | ICD-10-CM | POA: Insufficient documentation

## 2020-07-29 DIAGNOSIS — S134XXA Sprain of ligaments of cervical spine, initial encounter: Secondary | ICD-10-CM | POA: Diagnosis not present

## 2020-07-29 DIAGNOSIS — Z043 Encounter for examination and observation following other accident: Secondary | ICD-10-CM | POA: Diagnosis not present

## 2020-07-29 DIAGNOSIS — S161XXA Strain of muscle, fascia and tendon at neck level, initial encounter: Secondary | ICD-10-CM

## 2020-07-29 DIAGNOSIS — Y92000 Kitchen of unspecified non-institutional (private) residence as  the place of occurrence of the external cause: Secondary | ICD-10-CM | POA: Diagnosis not present

## 2020-07-29 MED ORDER — ACETAMINOPHEN 325 MG PO TABS
650.0000 mg | ORAL_TABLET | Freq: Once | ORAL | Status: AC
  Administered 2020-07-29: 650 mg via ORAL
  Filled 2020-07-29: qty 2

## 2020-07-29 NOTE — ED Provider Notes (Signed)
[facility] DEPT Provider Note   CSN: [id number] Arrival date & time: 07/29/20  1443     History Chief Complaint  Patient presents with  . [place] Bowl in the kitchen ~about an hour ago hit right side of head    Natalie [name] is a 87 y.o. female.  Patient c/o fall. Symptoms acute onset today, episodic, was trying to reach up/over for seasoning whiel cooking and lost balance, and fell. Hit head/right scalp, was dazed. No loc. Dull headache to area, constant, non radiating. Also c/o neck pain and right elbow contusion post fall.  No radicular pain or numbness/weakness. Nickel sized abrasions to right elbow. Tetanus up to date within past couple years. Denies faintness or dizziness prior to fall. No cp or sob. No nv. Denies other pain or injury. At baseline, walks slowly, limited distance w walker.   The history is provided by the patient and a relative.       Past Medical History:  Diagnosis Date  . Back pain, chronic   . BCC (basal cell carcinoma of skin)    several, nose , 2012 L forehead  . Degeneration of lumbar intervertebral disc    Dr. Nelva Bush  . Diabetes mellitus   . HTN (hypertension)   . Hyperlipidemia   . Hyperparathyroidism   . Lumbago    Lumbar epidural steroid injection at L5-S1 midline by Dr. Nelva Bush  . Pelvic pain in female 2005    back and pelvic pain, s/p w-u, CT (-), u/s showed a thick endometrium, s/p Bx per gyn  . Ureteric obstruction    hydronephrosis,sees urology    Patient Active Problem List   Diagnosis Date Noted  . Closed fracture of right orbital floor with routine healing 02/24/2018  . Chronic left shoulder pain 10/05/2016  . Allergies 06/01/2016  . PCP NOTES >>>>>>>>>>>>>>>>>>> 11/27/2015  . Fatigue 03/19/2014  . Vertigo 09/19/2013  . Preop examination 02/05/2013  . Annual physical exam 02/05/2011  . DM (diabetes mellitus) type II controlled, neurological manifestation ([place]) 05/01/2008  . Dyslipidemia  --intolerant to medications 05/01/2008  . Back pain, chronic-sees Dr.Ramos 10/11/2007  . HTN (hypertension) 11/30/2006  . S/P lobectomy of lung 11/30/2006  . OBSTRUCTION, URETERIC NEC 11/30/2006    Past Surgical History:  Procedure Laterality Date  . CYSTECTOMY  1989   thyriod  . LOBECTOMY  1989   s/p  RML lung lobectomy, biopsy showed precancerous changes   . POLYPECTOMY     colon     OB History    Gravida  4   Para  4   Term      Preterm      AB      Living  4     SAB      IAB      Ectopic      Multiple      Live Births              Family History  Problem Relation Age of Onset  . Diabetes Brother        X 2  . Hypertension Brother   . Hypertension Mother   . Brain cancer Mother        tumor  . CAD Father        MI at 39  . Breast cancer Neg Hx   . Colon cancer Neg Hx     Social History   Tobacco Use  . Smoking status: Never Smoker  .  Smokeless tobacco: Never Used  Vaping Use  . Vaping Use: Never used  Substance Use Topics  . Alcohol use: Yes    Comment: Rarely  . Drug use: No    Home Medications Prior to Admission medications   Medication Sig Start Date End Date Taking? Authorizing Provider  ACCU-CHEK GUIDE test strip USE TO TEST BLOOD SUGAR ONCE DAILY 07/15/20   Colon Branch, MD  acetaminophen (TYLENOL) 500 MG tablet Take 1,250 mg by mouth every 6 (six) hours as needed for mild pain.    [provider]  albuterol (PROVENTIL HFA;VENTOLIN HFA) 108 (90 BASE) MCG/ACT inhaler Inhale 2 puffs into the lungs every 6 (six) hours as needed for wheezing. 11/15/11 07/14/20  Colon Branch, MD  amLODipine (NORVASC) 5 MG tablet Take 1 tablet (5 mg total) by mouth daily. 06/24/20   Colon Branch, MD  azelastine (ASTELIN) 0.1 % nasal spray Place 2 sprays into both nostrils 2 (two) times daily. 05/09/19   Colon Branch, MD  BIOTIN PO Take by mouth.    [provider]  blood glucose meter kit and supplies Dispense based on patient and insurance  preference. Check blood sugars once daily 01/02/20   Colon Branch, MD  calcium carbonate (TUMS - DOSED IN MG ELEMENTAL CALCIUM) 500 MG chewable tablet Chew 1 tablet by mouth daily.    [provider]  CRANBERRY PO Take by mouth.    [provider]  EPIPEN 2-PAK 0.3 MG/0.3ML SOAJ injection Reported on 11/27/2015 Patient not taking: No sig reported 03/26/13   [provider]  fluticasone (FLONASE) 50 MCG/ACT nasal spray Place 2 sprays into the nose daily.    [provider]  fluticasone (FLOVENT HFA) 110 MCG/ACT inhaler Inhale 1-2 puffs into the lungs 2 (two) times daily.    [provider]  furosemide (LASIX) 20 MG tablet Take 0.5 tablets (10 mg total) by mouth in the morning and at bedtime. 07/14/20   Colon Branch, MD  HYDROcodone-acetaminophen [facility]) 10-325 MG per tablet Take 1 tablet by mouth every 8 (eight) hours as needed.    [provider]  lisinopril (ZESTRIL) 20 MG tablet Take 1 tablet (20 mg total) by mouth daily. 06/23/20   Colon Branch, MD  loratadine (CLARITIN) 10 MG tablet Take 10 mg by mouth daily.    [provider]  metFORMIN (GLUCOPHAGE) 500 MG tablet Take 1 tablet (500 mg total) by mouth 2 (two) times daily with a meal. 07/14/20   Colon Branch, MD  Multiple Vitamins-Minerals (MULTIVITAMIN,TX-MINERALS) tablet Take 1 tablet by mouth daily.    [provider]  naproxen sodium (ANAPROX) 220 MG tablet Take 220 mg by mouth daily.     [provider]  OneTouch Delica Lancets 99I MISC Check blood sugar once daily 08/29/19   Colon Branch, MD  Simethicone (MAALOX ANTI-GAS PO) Take by mouth as needed. Any anti-gas medication[tablet as needed].    [provider]    Allergies    Cefixime, Cefprozil, Cefuroxime axetil, Nitrofurantoin, Penicillins, and Sulfonamide derivatives  Review of Systems   Review of Systems  Constitutional: Negative for fever.  HENT: Negative for nosebleeds.   Eyes: Negative for  redness.  Respiratory: Negative for shortness of breath.   Cardiovascular: Negative for chest pain.  Gastrointestinal: Negative for abdominal pain, nausea and vomiting.  Genitourinary: Negative for flank pain.  Musculoskeletal: Negative for back pain.  Skin: Positive for wound.  Neurological: Negative for weakness and numbness.  Hematological: Does not bruise/bleed easily.       No anticoag use.   Psychiatric/Behavioral: Negative for confusion.    Physical Exam Updated Vital Signs BP (!) 164/71 (BP Location: Right Arm)   Pulse 88   Temp 99.5 F (37.5 C) (Oral)   Resp 16   Ht 1.676 m ($Remove'5\' 6"'isEEUYn$ )   Wt 79.8 kg   SpO2 96%   BMI 28.41 kg/m   Physical Exam Vitals and nursing note reviewed.  Constitutional:      Appearance: Normal appearance. She is well-developed.  HENT:     Head:     Comments: Contusion right scalp.    Nose: Nose normal.     Mouth/Throat:     Mouth: Mucous membranes are moist.  Eyes:     General: No scleral icterus.    Conjunctiva/sclera: Conjunctivae normal.     Pupils: Pupils are equal, round, and reactive to light.  Neck:     Trachea: No tracheal deviation.  Cardiovascular:     Rate and Rhythm: Normal rate and regular rhythm.     Pulses: Normal pulses.     Heart sounds: Normal heart sounds. No murmur heard. No friction rub. No gallop.   Pulmonary:     Effort: Pulmonary effort is normal. No respiratory distress.     Breath sounds: Normal breath sounds.  Chest:     Chest wall: No tenderness.  Abdominal:     General: Bowel sounds are normal. There is no distension.     Palpations: Abdomen is soft.     Tenderness: There is no abdominal tenderness.  Genitourinary:    Comments: No cva tenderness.  Musculoskeletal:        General: No swelling.     Cervical back: Normal range of motion and neck supple. No rigidity. No muscular tenderness.     Comments: Mid cervical tenderness, otherwise, CTLS spine, non tender, aligned, no step off. Tenderness right  elbow posteriorly, otherwise good rom bil extremities without pain or focal bony tenderness.   Skin:    General: Skin is warm and dry.     Findings: No rash.  Neurological:     Mental Status: She is alert.     Comments: Alert, speech normal. Motor/sens grossly intact bil.   Psychiatric:        Mood and Affect: Mood normal.     ED Results / Procedures / Treatments   Labs (all labs ordered are listed, but only abnormal results are displayed) Labs Reviewed - No data to display  EKG None  Radiology DG ELBOW COMPLETE RIGHT (3+VIEW)  Result Date: 07/29/2020 CLINICAL DATA:  Right elbow contusion.  Status post fall. EXAM: RIGHT ELBOW - COMPLETE 3+ VIEW COMPARISON:  None. FINDINGS: There is no evidence of fracture, dislocation, or joint effusion. There is no evidence of arthropathy or other focal bone abnormality. Soft tissues are unremarkable. IMPRESSION: Negative. Electronically Signed   By: Kerby Moors M.D.   On: 07/29/2020 16:25   CT HEAD WO CONTRAST  Result Date: 07/29/2020 CLINICAL DATA:  Status post fall today.  Initial encounter. EXAM: CT HEAD WITHOUT CONTRAST CT CERVICAL SPINE WITHOUT CONTRAST TECHNIQUE: Multidetector CT imaging of the head and cervical spine was performed following the standard protocol without intravenous contrast. Multiplanar CT image reconstructions of the cervical spine were also generated. COMPARISON:  Head and cervical spine CT scans 02/09/2021. FINDINGS: CT HEAD FINDINGS Brain: No evidence of acute infarction, hemorrhage, hydrocephalus, extra-axial collection or mass lesion/mass effect. Atrophy and chronic microvascular  ischemic change noted. Vascular: No hyperdense vessel or unexpected calcification. Skull: Intact.  No focal lesion. Sinuses/Orbits: Left mastoid effusion is unchanged. Otherwise unremarkable. Other: None. CT CERVICAL SPINE FINDINGS Alignment: Trace degenerative anterolisthesis C6 on C7 and C7 on T1 is unchanged. Skull base and vertebrae: No acute  fracture. No primary bone lesion or focal pathologic process. Soft tissues and spinal canal: No prevertebral fluid or swelling. No visible canal hematoma. Disc levels: Multilevel facet arthropathy. Loss of disc space height and endplate spurring are worst at C5-6 and T1-2. No change. Upper chest: Left upper lobe pulmonary nodule present since 2016 is unchanged. Other: None. IMPRESSION: No acute abnormality head or cervical spine. Atrophy and chronic microvascular ischemic change. Cervical spondylosis. Electronically Signed   By: Inge Rise M.D.   On: 07/29/2020 16:29   CT CERVICAL SPINE WO CONTRAST  Result Date: 07/29/2020 CLINICAL DATA:  Status post fall today.  Initial encounter. EXAM: CT HEAD WITHOUT CONTRAST CT CERVICAL SPINE WITHOUT CONTRAST TECHNIQUE: Multidetector CT imaging of the head and cervical spine was performed following the standard protocol without intravenous contrast. Multiplanar CT image reconstructions of the cervical spine were also generated. COMPARISON:  Head and cervical spine CT scans 02/09/2021. FINDINGS: CT HEAD FINDINGS Brain: No evidence of acute infarction, hemorrhage, hydrocephalus, extra-axial collection or mass lesion/mass effect. Atrophy and chronic microvascular ischemic change noted. Vascular: No hyperdense vessel or unexpected calcification. Skull: Intact.  No focal lesion. Sinuses/Orbits: Left mastoid effusion is unchanged. Otherwise unremarkable. Other: None. CT CERVICAL SPINE FINDINGS Alignment: Trace degenerative anterolisthesis C6 on C7 and C7 on T1 is unchanged. Skull base and vertebrae: No acute fracture. No primary bone lesion or focal pathologic process. Soft tissues and spinal canal: No prevertebral fluid or swelling. No visible canal hematoma. Disc levels: Multilevel facet arthropathy. Loss of disc space height and endplate spurring are worst at C5-6 and T1-2. No change. Upper chest: Left upper lobe pulmonary nodule present since 2016 is unchanged. Other:  None. IMPRESSION: No acute abnormality head or cervical spine. Atrophy and chronic microvascular ischemic change. Cervical spondylosis. Electronically Signed   By: Inge Rise M.D.   On: 07/29/2020 16:29    Procedures Procedures   Medications Ordered in ED Medications - No data to display  ED Course  I have reviewed the triage vital signs and the nursing notes.  Pertinent labs & imaging results that were available during my care of the patient were reviewed by me and considered in my medical decision making (see chart for details).    MDM Rules/Calculators/A&P                         Imaging studies ordered.   Reviewed nursing notes and prior charts for additional history.   Xrays reviewed/interpreted by me - no fx.   CT reviewed/interpreted by me - no hem.   Wound cleaned, bacitracin and sterile, non adherent dressing applied.   Acetaminophen po. Po fluids.   Ambulate in hall.  Pt appears stable for d/c.    Final Clinical Impression(s) / ED Diagnoses Final diagnoses:  None    Rx / DC Orders ED Discharge Orders    None       Lajean Saver, MD 07/29/20 [phone]

## 2020-07-29 NOTE — ED Notes (Signed)
Pt was cooking cabbage and fell while trying to reach for the pepper. Uses cane or walker for ambulation. Fell on right side of body and head and elbow has a quarter size abrasion  Denies LOC - could not get up without assistance. When getting up pt felt "a bit dizzy"

## 2020-07-29 NOTE — Discharge Instructions (Signed)
It was our pleasure to provide your ER care today - we hope that you feel better.  Icepack to sore area. Keep abrasion very clean.  Take acetaminophen as need.   Fall precautions.   Return to ER if worse, new symptoms, new or severe pain, fainting, infection of wound, or other concern.

## 2020-08-04 ENCOUNTER — Telehealth: Payer: Self-pay | Admitting: Internal Medicine

## 2020-08-04 ENCOUNTER — Other Ambulatory Visit: Payer: Self-pay | Admitting: Internal Medicine

## 2020-08-04 NOTE — Telephone Encounter (Signed)
Medication: azelastine (ASTELIN) 0.1 % nasal spray [[id number]]      Has the patient contacted their pharmacy? no (If no, request that the patient contact the pharmacy for the refill.) (If yes, when and what did the pharmacy advise?)    Preferred Pharmacy (with phone number or street name  [facility] [address] AT [address] Phone:  [phone]  Fax:  [phone]     )     Agent: Please be advised that RX refills may take up to 3 business days. We ask that you follow-up with your pharmacy.

## 2020-08-04 NOTE — Telephone Encounter (Signed)
Rx sent 

## 2020-08-18 ENCOUNTER — Encounter: Payer: Self-pay | Admitting: Internal Medicine

## 2020-09-11 ENCOUNTER — Ambulatory Visit: Payer: Medicare PPO | Admitting: Internal Medicine

## 2020-09-16 ENCOUNTER — Ambulatory Visit: Payer: Medicare PPO | Admitting: Internal Medicine

## 2020-10-14 ENCOUNTER — Ambulatory Visit: Payer: Medicare PPO | Admitting: Internal Medicine

## 2020-10-27 ENCOUNTER — Ambulatory Visit: Payer: Medicare PPO | Admitting: Internal Medicine

## 2020-10-27 ENCOUNTER — Encounter: Payer: Self-pay | Admitting: Internal Medicine

## 2020-10-27 ENCOUNTER — Other Ambulatory Visit: Payer: Self-pay

## 2020-10-27 VITALS — BP 144/94 | HR 78 | Temp 98.2°F | Resp 18 | Ht 66.0 in | Wt 181.4 lb

## 2020-10-27 DIAGNOSIS — R399 Unspecified symptoms and signs involving the genitourinary system: Secondary | ICD-10-CM | POA: Diagnosis not present

## 2020-10-27 DIAGNOSIS — I10 Essential (primary) hypertension: Secondary | ICD-10-CM

## 2020-10-27 DIAGNOSIS — W19XXXD Unspecified fall, subsequent encounter: Secondary | ICD-10-CM

## 2020-10-27 DIAGNOSIS — E1140 Type 2 diabetes mellitus with diabetic neuropathy, unspecified: Secondary | ICD-10-CM | POA: Diagnosis not present

## 2020-10-27 NOTE — Patient Instructions (Addendum)
Go to [employer] lab tomorrow  Recommend Shingles vaccine (Shingrix) at your pharmacy (this is a 2 dose series)  Check the  blood pressure 2 or 3 times a   week   BP GOAL is between 110/65 and  135/85. If it is consistently higher or lower, let me know    GO TO THE FRONT DESK, [address] back for a checkup in 4 months

## 2020-10-27 NOTE — Progress Notes (Signed)
Subjective:    Patient ID: Natalie [name], female    DOB: 1933-04-28, 87 y.o.   MRN: [id number]  DOS:  10/27/2020 Type of visit - description: Routine follow-up  Here for follow-up Today with talk about hypertension, diabetes, pain management. Overall she feels like in the last 2 years her health in general has declined due to inactivity and COVID. Denies depression per se. BP was noted to be slightly elevated upon arrival, normal ambulatory BPs.  Wt Readings from Last 3 Encounters:  10/27/20 181 lb 6 oz (82.3 kg)  07/29/20 176 lb (79.8 kg)  07/14/20 176 lb (79.8 kg)     Review of Systems See above   Past Medical History:  Diagnosis Date   Back pain, chronic    BCC (basal cell carcinoma of skin)    several, nose , 2012 L forehead   Degeneration of lumbar intervertebral disc    Dr. Ethelene Hal   Diabetes mellitus    HTN (hypertension)    Hyperlipidemia    Hyperparathyroidism    Lumbago    Lumbar epidural steroid injection at L5-S1 midline by Dr. Ethelene Hal   Pelvic pain in female 2005    back and pelvic pain, s/p w-u, CT (-), u/s showed a thick endometrium, s/p Bx per gyn   Ureteric obstruction    hydronephrosis,sees urology    Past Surgical History:  Procedure Laterality Date   CYSTECTOMY  1989   thyriod   LOBECTOMY  1989   s/p  RML lung lobectomy, biopsy showed precancerous changes    POLYPECTOMY     colon    Allergies as of 10/27/2020       Reactions   Cefixime    unknown   Cefprozil Nausea And Vomiting   Cefuroxime Axetil Nausea And Vomiting   Nitrofurantoin    unknown   Penicillins    unknown   Sulfonamide Derivatives    unknown        Medication List        Accurate as of October 27, 2020 11:59 PM. If you have any questions, ask your nurse or doctor.          Accu-Chek Guide test strip Generic drug: glucose blood USE TO TEST BLOOD SUGAR ONCE DAILY   acetaminophen 500 MG tablet Commonly known as: TYLENOL Take 1,250 mg by mouth every 6  (six) hours as needed for mild pain.   albuterol 108 (90 Base) MCG/ACT inhaler Commonly known as: VENTOLIN HFA Inhale 2 puffs into the lungs every 6 (six) hours as needed for wheezing.   amLODipine 10 MG tablet Commonly known as: NORVASC Take 0.5 tablets (5 mg total) by mouth daily. What changed: Another medication with the same name was removed. Continue taking this medication, and follow the directions you see here. Changed by: Willow Ora, MD   azelastine 0.1 % nasal spray Commonly known as: ASTELIN Place 2 sprays into both nostrils 2 (two) times daily.   BIOTIN PO Take by mouth.   blood glucose meter kit and supplies Dispense based on patient and insurance preference. Check blood sugars once daily   calcium carbonate 500 MG chewable tablet Commonly known as: TUMS - dosed in mg elemental calcium Chew 1 tablet by mouth daily.   CRANBERRY PO Take by mouth.   EpiPen 2-Pak 0.3 mg/0.3 mL Soaj injection Generic drug: EPINEPHrine Reported on 11/27/2015   fluticasone 110 MCG/ACT inhaler Commonly known as: FLOVENT HFA Inhale 1-2 puffs into the lungs 2 (two) times daily.  fluticasone 50 MCG/ACT nasal spray Commonly known as: FLONASE Place 2 sprays into the nose daily.   furosemide 20 MG tablet Commonly known as: LASIX Take 0.5 tablets (10 mg total) by mouth in the morning and at bedtime.   HYDROcodone-acetaminophen 10-325 MG tablet Commonly known as: NORCO Take 1 tablet by mouth every 8 (eight) hours as needed.   lisinopril 20 MG tablet Commonly known as: ZESTRIL Take 1 tablet (20 mg total) by mouth daily.   loratadine 10 MG tablet Commonly known as: CLARITIN Take 10 mg by mouth daily.   MAALOX ANTI-GAS PO Take by mouth as needed. Any anti-gas medication[tablet as needed].   metFORMIN 500 MG tablet Commonly known as: GLUCOPHAGE Take 1 tablet (500 mg total) by mouth 2 (two) times daily with a meal.   multivitamin,tx-minerals tablet Take 1 tablet by mouth daily.    naproxen sodium 220 MG tablet Commonly known as: ALEVE Take 220 mg by mouth daily.   OneTouch Delica Lancets 01U Misc Check blood sugar once daily           Objective:   Physical Exam BP (!) 144/94 (BP Location: Left Arm, Patient Position: Sitting, Cuff Size: Small)   Pulse 78   Temp 98.2 F (36.8 C) (Oral)   Resp 18   Ht $R'5\' 6"'JP$  (1.676 m)   Wt 181 lb 6 oz (82.3 kg)   SpO2 95%   BMI 29.27 kg/m  General: Well developed, slightly frail, no distress  HEENT:  Normocephalic . Face symmetric, atraumatic Lungs:  CTA B Normal respiratory effort, no intercostal retractions, no accessory muscle use. Heart: RRR,  no murmur.  Lower extremities: no pretibial edema bilaterally  Skin: Exposed areas without rash. Not pale. Not jaundice Neurologic:  alert & oriented X3.  Speech normal, gait: Assisted by a rolling walker strength symmetric and appropriate for age.  Psych: Cognition and judgment appear intact.  Cooperative with normal attention span and concentration.  Behavior appropriate. No anxious or depressed appearing.     Assessment    Assessment DM, complicated by neuropathy and callus HTN Hyperlipidemia, intolerant to medications , not checking labs Allergies, B-spasm, get shots----- sees Dr Donneta Romberg  Fatigue, chronic GU:  Hydronephrosis  dx ~ 2007, follow-up with serial USs. DX was right UPJ obstruction. Had a Lasix renogram 2013 ( R 46%, L 54%) Dr. Jasmine December, rx to RTC PRN.  [place] 11-2016 : no obstruction MSK: --DJD --Chronic back pain, dr Nelva Bush , he rx hydrocodone BCC H/o pelvic pain, 2005, w/u per gyn H/o hyperparathyroidism H/o RML Lobectomy, 1989, bx precancerous cells ?  Heart murmur: Echocardiogram 04/2017 essentially normal  PLAN DM: Currently on metformin, 500 mg twice daily.  Check A1c. HTN: On amlodipine 10 mg: Half tablet daily, unable to take 10 mg of amlodipine due to edema. Also on lisinopril and  Lasix (takes only half tablet twice daily due to  excessive urination if she takes a whole tablet) BP today slightly elevated, better at home, continue rechecking ambulatory BPs.   Check a BMP and CBC Fall: Went to the ER after a fall, uses a walker appropriately, offered PT home referral, declined for now. UTI:  E. coli UTI Dx 06/20/2020, prescribed Cipro, subsequent urine culture negative. Today she reports no fever chills but some dysuria, strongly request a UA urine culture, will do Preventive care reviewed:  RTC 4 months     This visit occurred during the SARS-CoV-2 public health emergency.  Safety protocols were in place, including screening questions prior  to the visit, additional usage of staff PPE, and extensive cleaning of exam room while observing appropriate contact time as indicated for disinfecting solutions.

## 2020-10-28 ENCOUNTER — Other Ambulatory Visit (INDEPENDENT_AMBULATORY_CARE_PROVIDER_SITE_OTHER): Payer: Medicare PPO

## 2020-10-28 DIAGNOSIS — R399 Unspecified symptoms and signs involving the genitourinary system: Secondary | ICD-10-CM | POA: Diagnosis not present

## 2020-10-28 DIAGNOSIS — E1140 Type 2 diabetes mellitus with diabetic neuropathy, unspecified: Secondary | ICD-10-CM | POA: Diagnosis not present

## 2020-10-28 DIAGNOSIS — I10 Essential (primary) hypertension: Secondary | ICD-10-CM

## 2020-10-28 LAB — BASIC METABOLIC PANEL
BUN: 16 mg/dL (ref 6–23)
CO2: 27 mEq/L (ref 19–32)
Calcium: 10 mg/dL (ref 8.4–10.5)
Chloride: 101 mEq/L (ref 96–112)
Creatinine, Ser: 0.8 mg/dL (ref 0.40–1.20)
GFR: 67.09 mL/min (ref >60.00)
Glucose, Bld: 136 mg/dL — ABNORMAL HIGH (ref 70–99)
Potassium: 4.2 mEq/L (ref 3.5–5.1)
Sodium: 135 mEq/L (ref 135–145)

## 2020-10-28 LAB — CBC WITH DIFFERENTIAL/PLATELET
Basophils Absolute: 0.1 10*3/uL (ref 0.0–0.1)
Basophils Relative: 0.7 % (ref 0.0–3.0)
Eosinophils Absolute: 0.2 10*3/uL (ref 0.0–0.7)
Eosinophils Relative: 3.1 % (ref 0.0–5.0)
HCT: 38.2 % (ref 36.0–46.0)
Hemoglobin: 12.7 g/dL (ref 12.0–15.0)
Lymphocytes Relative: 37.3 % (ref 12.0–46.0)
Lymphs Abs: 2.8 10*3/uL (ref 0.7–4.0)
MCHC: 33.2 g/dL (ref 30.0–36.0)
MCV: 87.6 fl (ref 78.0–100.0)
Monocytes Absolute: 0.7 10*3/uL (ref 0.1–1.0)
Monocytes Relative: 9.5 % (ref 3.0–12.0)
Neutro Abs: 3.6 10*3/uL (ref 1.4–7.7)
Neutrophils Relative %: 49.4 % (ref 43.0–77.0)
Platelets: 294 10*3/uL (ref 150.0–400.0)
RBC: 4.37 Mil/uL (ref 3.87–5.11)
RDW: 13.9 % (ref 11.5–15.5)
WBC: 7.4 10*3/uL (ref 4.0–10.5)

## 2020-10-28 LAB — HEMOGLOBIN A1C: Hgb A1c MFr Bld: 6.2 % (ref 4.6–6.5)

## 2020-10-28 LAB — URINALYSIS, ROUTINE W REFLEX MICROSCOPIC
Bilirubin Urine: NEGATIVE
Ketones, ur: NEGATIVE
Nitrite: NEGATIVE
Specific Gravity, Urine: 1.02 (ref 1.000–1.030)
Total Protein, Urine: NEGATIVE
Urine Glucose: NEGATIVE
Urobilinogen, UA: 0.2 (ref 0.0–1.0)
pH: 6 (ref 5.0–8.0)

## 2020-10-28 NOTE — Assessment & Plan Note (Signed)
DM: Currently on metformin, 500 mg twice daily.  Check A1c. HTN: On amlodipine 10 mg: Half tablet daily, unable to take 10 mg of amlodipine due to edema. Also on lisinopril and  Lasix (takes only half tablet twice daily due to excessive urination if she takes a whole tablet) BP today slightly elevated, better at home, continue rechecking ambulatory BPs.   Check a BMP and CBC Fall: Went to the ER after a fall, uses a walker appropriately, offered PT home referral, declined for now. UTI:  E. coli UTI Dx 06/20/2020, prescribed Cipro, subsequent urine culture negative. Today she reports no fever chills but some dysuria, strongly request a UA urine culture, will do Preventive care reviewed:  RTC 4 months

## 2020-10-28 NOTE — Assessment & Plan Note (Signed)
-  Td 2019;  pneumonia shot 04-2005 , 2013;   prevnar 2013;  zostavax 2011; shingrex discussed.   Had Felicity x 4  -Female care PAPS: no further screening.   Last mammogram 2019.  DEXA 11-2006 , 12-2008, 02-2013 , 2018 -----> normal.   CCS: last colonoscopy 7-11, aged out  for screening - U/S 05-2011 neg for AAA

## 2020-10-30 LAB — URINE CULTURE
MICRO NUMBER:: 12005234
SPECIMEN QUALITY:: ADEQUATE

## 2020-10-30 MED ORDER — CIPROFLOXACIN HCL 500 MG PO TABS: 500.0000 mg | ORAL_TABLET | Freq: Two times a day (BID) | ORAL | 0 refills | Status: DC

## 2020-11-20 ENCOUNTER — Other Ambulatory Visit: Payer: Self-pay | Admitting: Internal Medicine

## 2020-12-11 DIAGNOSIS — G894 Chronic pain syndrome: Secondary | ICD-10-CM | POA: Diagnosis not present

## 2020-12-16 DIAGNOSIS — Z5181 Encounter for therapeutic drug level monitoring: Secondary | ICD-10-CM | POA: Diagnosis not present

## 2020-12-16 DIAGNOSIS — Z79891 Long term (current) use of opiate analgesic: Secondary | ICD-10-CM | POA: Diagnosis not present

## 2020-12-20 ENCOUNTER — Other Ambulatory Visit: Payer: Self-pay | Admitting: Internal Medicine

## 2021-02-05 DIAGNOSIS — R300 Dysuria: Secondary | ICD-10-CM | POA: Diagnosis not present

## 2021-02-10 DIAGNOSIS — J301 Allergic rhinitis due to pollen: Secondary | ICD-10-CM | POA: Diagnosis not present

## 2021-02-10 DIAGNOSIS — J3081 Allergic rhinitis due to animal (cat) (dog) hair and dander: Secondary | ICD-10-CM | POA: Diagnosis not present

## 2021-02-10 DIAGNOSIS — J4530 Mild persistent asthma, uncomplicated: Secondary | ICD-10-CM | POA: Diagnosis not present

## 2021-02-10 DIAGNOSIS — J3089 Other allergic rhinitis: Secondary | ICD-10-CM | POA: Diagnosis not present

## 2021-02-26 ENCOUNTER — Encounter: Payer: Self-pay | Admitting: Internal Medicine

## 2021-02-26 ENCOUNTER — Other Ambulatory Visit: Payer: Self-pay

## 2021-02-26 ENCOUNTER — Ambulatory Visit: Payer: Medicare PPO | Admitting: Internal Medicine

## 2021-02-26 ENCOUNTER — Ambulatory Visit: Payer: Medicare PPO | Attending: Internal Medicine

## 2021-02-26 VITALS — BP 126/90 | HR 81 | Temp 98.3°F | Resp 18 | Ht 66.0 in | Wt 181.2 lb

## 2021-02-26 DIAGNOSIS — R5382 Chronic fatigue, unspecified: Secondary | ICD-10-CM

## 2021-02-26 DIAGNOSIS — T7840XD Allergy, unspecified, subsequent encounter: Secondary | ICD-10-CM | POA: Diagnosis not present

## 2021-02-26 DIAGNOSIS — E1140 Type 2 diabetes mellitus with diabetic neuropathy, unspecified: Secondary | ICD-10-CM | POA: Diagnosis not present

## 2021-02-26 DIAGNOSIS — I10 Essential (primary) hypertension: Secondary | ICD-10-CM

## 2021-02-26 DIAGNOSIS — Z23 Encounter for immunization: Secondary | ICD-10-CM

## 2021-02-26 LAB — BASIC METABOLIC PANEL
BUN: 14 mg/dL (ref 6–23)
CO2: 29 mEq/L (ref 19–32)
Calcium: 10.3 mg/dL (ref 8.4–10.5)
Chloride: 100 mEq/L (ref 96–112)
Creatinine, Ser: 0.7 mg/dL (ref 0.40–1.20)
GFR: 78.57 mL/min (ref >60.00)
Glucose, Bld: 114 mg/dL — ABNORMAL HIGH (ref 70–99)
Potassium: 4.9 mEq/L (ref 3.5–5.1)
Sodium: 137 mEq/L (ref 135–145)

## 2021-02-26 LAB — HEMOGLOBIN A1C: Hgb A1c MFr Bld: 6.3 % (ref 4.6–6.5)

## 2021-02-26 MED ORDER — AZELASTINE HCL 0.1 % NA SOLN: 2.0000 | Freq: Two times a day (BID) | NASAL | 12 refills | Status: DC

## 2021-02-26 MED ORDER — FLUTICASONE PROPIONATE 50 MCG/ACT NA SUSP: 2.0000 | Freq: Every day | NASAL | 12 refills | Status: DC

## 2021-02-26 NOTE — Progress Notes (Signed)
   Covid-19 Vaccination Clinic  Name:  Natalie [name]    MRN: [id number] DOB: [birth year]  02/26/2021  [name] was observed post Covid-19 immunization for 15 minutes without incident. She was provided with Vaccine Information Sheet and instruction to access the V-Safe system.   [name] was instructed to call 911 with any severe reactions post vaccine: Difficulty breathing  Swelling of face and throat  A fast heartbeat  A bad rash all over body  Dizziness and weakness

## 2021-02-26 NOTE — Patient Instructions (Addendum)
Recommend to proceed with the following vaccines at your pharmacy:  Shingrix (shingles) Covid booster Flu shot    Check the  blood pressure regularly BP GOAL is between 110/65 and  135/85. If it is consistently higher or lower, let me know     GO TO THE LAB : Get the blood work     [place], [place] back for a checkup in 4 to 6 months

## 2021-02-26 NOTE — Progress Notes (Signed)
Subjective:    Patient ID: Natalie [name], female    DOB: 04-Dec-1932, 87 y.o.   MRN: [id number]  DOS:  02/26/2021 Type of visit - description: f/u No new concerns. Chronic fatigue unchanged. Since the last visit went to urgent care with a ill-defined abdominal discomfort, urine culture apparently showed a UTI and she was prescribed antibiotics.  No fever chills No chest pain no difficulty breathing. Some edema on the lower extremities worse on the left No nausea or vomiting No depression No dysuria or gross hematuria   Review of Systems See above   Past Medical History:  Diagnosis Date   Back pain, chronic    BCC (basal cell carcinoma of skin)    several, nose , 2012 L forehead   Degeneration of lumbar intervertebral disc    Dr. Nelva Bush   Diabetes mellitus    HTN (hypertension)    Hyperlipidemia    Hyperparathyroidism    Lumbago    Lumbar epidural steroid injection at L5-S1 midline by Dr. Nelva Bush   Pelvic pain in female 2005    back and pelvic pain, s/p w-u, CT (-), u/s showed a thick endometrium, s/p Bx per gyn   Ureteric obstruction    hydronephrosis,sees urology    Past Surgical History:  Procedure Laterality Date   CYSTECTOMY  1989   thyriod   [place]   s/p  RML lung lobectomy, biopsy showed precancerous changes    POLYPECTOMY     colon    Allergies as of 02/26/2021       Reactions   Cefixime    unknown   Cefprozil Nausea And Vomiting   Cefuroxime Axetil Nausea And Vomiting   Nitrofurantoin    unknown   Penicillins    unknown   Sulfonamide Derivatives    unknown        Medication List        Accurate as of February 26, 2021 11:59 PM. If you have any questions, ask your nurse or doctor.          STOP taking these medications    ciprofloxacin 500 MG tablet Commonly known as: Cipro Stopped by: Kathlene November, MD       TAKE these medications    Accu-Chek Guide test strip Generic drug: glucose blood USE TO TEST BLOOD SUGAR  ONCE DAILY   acetaminophen 500 MG tablet Commonly known as: TYLENOL Take 1,250 mg by mouth every 6 (six) hours as needed for mild pain.   albuterol 108 (90 Base) MCG/ACT inhaler Commonly known as: VENTOLIN HFA Inhale 2 puffs into the lungs every 6 (six) hours as needed for wheezing.   amLODipine 10 MG tablet Commonly known as: NORVASC Take 0.5 tablets (5 mg total) by mouth daily.   azelastine 0.1 % nasal spray Commonly known as: ASTELIN Place 2 sprays into both nostrils 2 (two) times daily.   BIOTIN PO Take by mouth.   blood glucose meter kit and supplies Dispense based on patient and insurance preference. Check blood sugars once daily   calcium carbonate 500 MG chewable tablet Commonly known as: TUMS - dosed in mg elemental calcium Chew 1 tablet by mouth daily.   CRANBERRY PO Take by mouth.   EpiPen 2-Pak 0.3 mg/0.3 mL Soaj injection Generic drug: EPINEPHrine Reported on 11/27/2015   fluticasone 110 MCG/ACT inhaler Commonly known as: FLOVENT HFA Inhale 1-2 puffs into the lungs 2 (two) times daily.   fluticasone 50 MCG/ACT nasal spray Commonly known as: [place]  2 sprays into both nostrils daily. What changed: how to take this Changed by: Kathlene November, MD   furosemide 20 MG tablet Commonly known as: LASIX Take 1 tablet (20 mg total) by mouth daily.   HYDROcodone-acetaminophen 10-325 MG tablet Commonly known as: NORCO Take 1 tablet by mouth every 8 (eight) hours as needed.   lisinopril 20 MG tablet Commonly known as: ZESTRIL TAKE 1 TABLET(20 MG) BY MOUTH DAILY   loratadine 10 MG tablet Commonly known as: CLARITIN Take 10 mg by mouth daily.   MAALOX ANTI-GAS PO Take by mouth as needed. Any anti-gas medication[tablet as needed].   metFORMIN 500 MG tablet Commonly known as: GLUCOPHAGE Take 1 tablet (500 mg total) by mouth 2 (two) times daily with a meal.   multivitamin,tx-minerals tablet Take 1 tablet by mouth daily.   naproxen sodium 220 MG  tablet Commonly known as: ALEVE Take 110 mg by mouth daily.   OneTouch Delica Lancets 69V Misc Check blood sugar once daily           Objective:   Physical Exam BP 126/90 (BP Location: Left Arm, Patient Position: Sitting, Cuff Size: Small)   Pulse 81   Temp 98.3 F (36.8 C) (Oral)   Resp 18   Ht _0  (1.676 m)   Wt 181 lb 4 oz (82.2 kg)   SpO2 97%   BMI 29.25 kg/m  General:   Well developed, NAD, BMI noted. HEENT:  Normocephalic . Face symmetric, atraumatic Lungs:  CTA B Normal respiratory effort, no intercostal retractions, no accessory muscle use. Heart: RRR,  no murmur.  Lower extremities:.  Ankle edema noted, slightly more noticeable on the left. Skin: Not pale. Not jaundice Neurologic:  alert & oriented X3.  Speech normal, gait assisted by a walker Psych--  Cognition and judgment appear intact.  Cooperative with normal attention span and concentration.  Behavior appropriate. No anxious or depressed appearing.      Assessment     Assessment DM, complicated by neuropathy and callus HTN Hyperlipidemia, intolerant to medications , not checking labs Allergies, B-spasm, Dr Donneta Romberg  Fatigue, chronic GU:  Hydronephrosis  dx ~ 2007, follow-up with serial USs. DX was right UPJ obstruction. Had a Lasix renogram 2013 ( R 46%, L 54%) Dr. Jasmine December, rx to RTC PRN.  [place] 11-2016 : no obstruction MSK: --DJD --Chronic back pain, dr Nelva Bush , he rx hydrocodone BCC H/o pelvic pain, 2005, w/u per gyn H/o hyperparathyroidism H/o RML Lobectomy, 1989, bx precancerous cells ?  Heart murmur: Echocardiogram 04/2017 essentially normal  PLAN DM: On metformin, check A1c HTN: On amlodipine, Lasix, lisinopril.  Check BMP, reports ambulatory BPs are normal. Allergies: Won't  see Dr. Donneta Romberg regularly, request refill on nasal sprays, will do. Fatigue, chronic: Unchanged DJD, chronic back pain: On hydrocodone per Dr. Herma Mering, also takes half Aleve daily.  Advised of potential  toxicity, evidently she is tolerating it well, GI precautions d/w pt UTI: At the last visit had a UTI, was treated with Cipro, subsequently went to urgent care, had ill-defined symptoms, she was rec antibiotic for a UTI.  Currently with no symptoms.   Advised patient: Avoid antibiotics unless she does have UTI symptoms and a positive urine culture. Preventive care, will get COVID-vaccine and a flu shot. RTC 4 to 6 months     This visit occurred during the SARS-CoV-2 public health emergency.  Safety protocols were in place, including screening questions prior to the visit, additional usage of staff PPE, and extensive cleaning of  exam room while observing appropriate contact time as indicated for disinfecting solutions.

## 2021-02-28 NOTE — Assessment & Plan Note (Signed)
DM: On metformin, check A1c HTN: On amlodipine, Lasix, lisinopril.  Check BMP, reports ambulatory BPs are normal. Allergies: Won't  see Dr. Donneta Romberg regularly, request refill on nasal sprays, will do. Fatigue, chronic: Unchanged DJD, chronic back pain: On hydrocodone per Dr. Herma Mering, also takes half Aleve daily.  Advised of potential toxicity, evidently she is tolerating it well, GI precautions d/w pt UTI: At the last visit had a UTI, was treated with Cipro, subsequently went to urgent care, had ill-defined symptoms, she was rec antibiotic for a UTI.  Currently with no symptoms.   Advised patient: Avoid antibiotics unless she does have UTI symptoms and a positive urine culture. Preventive care, will get COVID-vaccine and a flu shot. RTC 4 to 6 months

## 2021-03-20 ENCOUNTER — Other Ambulatory Visit (HOSPITAL_BASED_OUTPATIENT_CLINIC_OR_DEPARTMENT_OTHER): Payer: Self-pay

## 2021-03-20 MED ORDER — PFIZER COVID-19 VAC BIVALENT 30 MCG/0.3ML IM SUSP
INTRAMUSCULAR | 0 refills | Status: DC
  Filled 2021-03-20: qty 0.3, 1d supply, fill #0

## 2021-05-05 DIAGNOSIS — R300 Dysuria: Secondary | ICD-10-CM | POA: Diagnosis not present

## 2021-05-05 DIAGNOSIS — E1165 Type 2 diabetes mellitus with hyperglycemia: Secondary | ICD-10-CM | POA: Diagnosis not present

## 2021-05-05 DIAGNOSIS — N952 Postmenopausal atrophic vaginitis: Secondary | ICD-10-CM | POA: Diagnosis not present

## 2021-06-12 ENCOUNTER — Other Ambulatory Visit: Payer: Self-pay | Admitting: Internal Medicine

## 2021-06-17 ENCOUNTER — Other Ambulatory Visit: Payer: Self-pay | Admitting: Internal Medicine

## 2021-07-01 DIAGNOSIS — M5136 Other intervertebral disc degeneration, lumbar region: Secondary | ICD-10-CM | POA: Diagnosis not present

## 2021-07-01 DIAGNOSIS — G894 Chronic pain syndrome: Secondary | ICD-10-CM | POA: Diagnosis not present

## 2021-07-06 ENCOUNTER — Other Ambulatory Visit: Payer: Self-pay | Admitting: Internal Medicine

## 2021-07-13 DIAGNOSIS — Q6211 Congenital occlusion of ureteropelvic junction: Secondary | ICD-10-CM | POA: Diagnosis not present

## 2021-07-13 DIAGNOSIS — M545 Low back pain, unspecified: Secondary | ICD-10-CM | POA: Diagnosis not present

## 2021-07-13 DIAGNOSIS — Z87440 Personal history of urinary (tract) infections: Secondary | ICD-10-CM | POA: Diagnosis not present

## 2021-07-23 ENCOUNTER — Encounter: Payer: Self-pay | Admitting: Internal Medicine

## 2021-07-28 ENCOUNTER — Ambulatory Visit: Payer: Medicare PPO | Admitting: Internal Medicine

## 2021-08-04 ENCOUNTER — Encounter: Payer: Self-pay | Admitting: Internal Medicine

## 2021-08-04 ENCOUNTER — Ambulatory Visit: Payer: Medicare PPO | Admitting: Internal Medicine

## 2021-08-04 VITALS — BP 136/92 | HR 73 | Temp 98.0°F | Resp 18 | Ht 66.0 in | Wt 180.5 lb

## 2021-08-04 DIAGNOSIS — M545 Low back pain, unspecified: Secondary | ICD-10-CM | POA: Diagnosis not present

## 2021-08-04 DIAGNOSIS — E1140 Type 2 diabetes mellitus with diabetic neuropathy, unspecified: Secondary | ICD-10-CM | POA: Diagnosis not present

## 2021-08-04 DIAGNOSIS — G8929 Other chronic pain: Secondary | ICD-10-CM

## 2021-08-04 DIAGNOSIS — I10 Essential (primary) hypertension: Secondary | ICD-10-CM | POA: Diagnosis not present

## 2021-08-04 DIAGNOSIS — L98499 Non-pressure chronic ulcer of skin of other sites with unspecified severity: Secondary | ICD-10-CM

## 2021-08-04 LAB — COMPREHENSIVE METABOLIC PANEL
ALT: 13 U/L (ref 0–35)
AST: 20 U/L (ref 0–37)
Albumin: 4.6 g/dL (ref 3.5–5.2)
Alkaline Phosphatase: 64 U/L (ref 39–117)
BUN: 17 mg/dL (ref 6–23)
CO2: 28 mEq/L (ref 19–32)
Calcium: 10.1 mg/dL (ref 8.4–10.5)
Chloride: 97 mEq/L (ref 96–112)
Creatinine, Ser: 0.8 mg/dL (ref 0.40–1.20)
GFR: 66.73 mL/min (ref >60.00)
Glucose, Bld: 98 mg/dL (ref 70–99)
Potassium: 4.6 mEq/L (ref 3.5–5.1)
Sodium: 133 mEq/L — ABNORMAL LOW (ref 135–145)
Total Bilirubin: 0.4 mg/dL (ref 0.2–1.2)
Total Protein: 7.2 g/dL (ref 6.0–8.3)

## 2021-08-04 LAB — HEMOGLOBIN A1C: Hgb A1c MFr Bld: 6.3 % (ref 4.6–6.5)

## 2021-08-04 NOTE — Assessment & Plan Note (Signed)
DM: On metformin, no recent ambulatory CBGs, check A1c. ?Neuropathy w/ pre-ulcerative calluses.  Recommend good local care, refer to podiatry for evaluation of callous, also needs nail care. ?HTN: Ambulatory BPs in the 130s/80s, continue amlodipine, Lasix, lisinopril.  Check a CMP ?Recurrent UTIs: Saw urology 07/14/2021, prevention was discussed, was rec to go to the urology office if she suspected UTI, they are planning to get a catheter sample for a more accurate dx.Marland Kitchen ?Chronic back pain: On hydrocodone prescribed elsewhere, she also takes half naproxen daily, GI precautions reinforced. ?RTC 3 to 4 months CPX  ?

## 2021-08-04 NOTE — Patient Instructions (Addendum)
Per our records you are due for your diabetic eye exam. Please contact your eye doctor to schedule an appointment. Please have them send copies of your office visit notes to [place]. Our fax number is [phone]. If you need a referral to an eye doctor please let us know. ? ? ?Check the  blood pressure regularly ?BP GOAL is between 110/65 and  135/85. ?If it is consistently higher or lower, let me know ? ? ?GO TO THE LAB : Get the blood work   ? ? ?[place], [place] ?Come back for a physical exam in 3 to 4 months ? ? ?Diabetes Mellitus and Foot Care ?Foot care is an important part of your health, especially when you have diabetes. Diabetes may cause you to have problems because of poor blood flow (circulation) to your feet and legs, which can cause your skin to: ?Become thinner and drier. ?Break more easily. ?Heal more slowly. ?Peel and crack. ?You may also have nerve damage (neuropathy) in your legs and feet, causing decreased feeling in them. This means that you may not notice minor injuries to your feet that could lead to more serious problems. Noticing and addressing any potential problems early is the best way to prevent future foot problems. ?How to care for your feet ?Foot hygiene ? ?Wash your feet daily with warm water and mild soap. Do not use hot water. Then, pat your feet and the areas between your toes until they are completely dry. Do not soak your feet as this can dry your skin. ?Trim your toenails straight across. Do not dig under them or around the cuticle. File the edges of your nails with an emery board or nail file. ?Apply a moisturizing lotion or petroleum jelly to the skin on your feet and to dry, brittle toenails. Use lotion that does not contain alcohol and is unscented. Do not apply lotion between your toes. ?Shoes and socks ?Wear clean socks or stockings every day. Make sure they are not too tight. Do not wear knee-high stockings since they may decrease  blood flow to your legs. ?Wear shoes that fit properly and have enough cushioning. Always look in your shoes before you put them on to be sure there are no objects inside. ?To break in new shoes, wear them for just a few hours a day. This prevents injuries on your feet. ?Wounds, scrapes, corns, and calluses ? ?Check your feet daily for blisters, cuts, bruises, sores, and redness. If you cannot see the bottom of your feet, use a mirror or ask someone for help. ?Do not cut corns or calluses or try to remove them with medicine. ?If you find a minor scrape, cut, or break in the skin on your feet, keep it and the skin around it clean and dry. You may clean these areas with mild soap and water. Do not clean the area with peroxide, alcohol, or iodine. ?If you have a wound, scrape, corn, or callus on your foot, look at it several times a day to make sure it is healing and not infected. Check for: ?Redness, swelling, or pain. ?Fluid or blood. ?Warmth. ?Pus or a bad smell. ?General tips ?Do not cross your legs. This may decrease blood flow to your feet. ?Do not use heating pads or hot water bottles on your feet. They may burn your skin. If you have lost feeling in your feet or legs, you may not know this is happening until it is  too late. ?Protect your feet from hot and cold by wearing shoes, such as at the beach or on hot pavement. ?Schedule a complete foot exam at least once a year (annually) or more often if you have foot problems. Report any cuts, sores, or bruises to your health care provider immediately. ?Where to find more information ?American Diabetes Association: www.diabetes.org ?Association of Diabetes Care & Education Specialists: www.diabeteseducator.org ?Contact a health care provider if: ?You have a medical condition that increases your risk of infection and you have any cuts, sores, or bruises on your feet. ?You have an injury that is not healing. ?You have redness on your legs or feet. ?You feel burning or  tingling in your legs or feet. ?You have pain or cramps in your legs and feet. ?Your legs or feet are numb. ?Your feet always feel cold. ?You have pain around any toenails. ?Get help right away if: ?You have a wound, scrape, corn, or callus on your foot and: ?You have pain, swelling, or redness that gets worse. ?You have fluid or blood coming from the wound, scrape, corn, or callus. ?Your wound, scrape, corn, or callus feels warm to the touch. ?You have pus or a bad smell coming from the wound, scrape, corn, or callus. ?You have a fever. ?You have a red line going up your leg. ?Summary ?Check your feet every day for blisters, cuts, bruises, sores, and redness. ?Apply a moisturizing lotion or petroleum jelly to the skin on your feet and to dry, brittle toenails. ?Wear shoes that fit properly and have enough cushioning. ?If you have foot problems, report any cuts, sores, or bruises to your health care provider immediately. ?Schedule a complete foot exam at least once a year (annually) or more often if you have foot problems. ?This information is not intended to replace advice given to you by your health care provider. Make sure you discuss any questions you have with your health care provider. ?Document Revised: 11/22/2019 Document Reviewed: 11/22/2019 ?Elsevier Patient Education ? 2022 [employer]. ? ? ? ?

## 2021-08-04 NOTE — Progress Notes (Signed)
? ?Subjective:  ? ? Patient ID: Natalie [name], female    DOB: 1932/06/07, 88 y.o.   MRN: [id number] ? ?DOS:  08/04/2021 ?Type of visit - description: Routine checkup ? ?Today we review her chronic medical problems. ?In general she feels that she is loosing some ground but denies chest pain or difficulty breathing. ?Edema is on and off but not worse than before. ?I noted a preulcerative callus, she reports that her neuropathy is about the same, characterized by paresthesias but no pain. ? ?Review of Systems ?See above  ? ?Past Medical History:  ?Diagnosis Date  ? Back pain, chronic   ? BCC (basal cell carcinoma of skin)   ? several, nose , 2012 L forehead  ? Degeneration of lumbar intervertebral disc   ? Dr. Nelva Bush  ? Diabetes mellitus   ? HTN (hypertension)   ? Hyperlipidemia   ? Hyperparathyroidism   ? Lumbago   ? Lumbar epidural steroid injection at L5-S1 midline by Dr. Nelva Bush  ? Pelvic pain in female 2005   ? back and pelvic pain, s/p w-u, CT (-), u/s showed a thick endometrium, s/p Bx per gyn  ? Ureteric obstruction   ? hydronephrosis,sees urology  ? ? ?Past Surgical History:  ?Procedure Laterality Date  ? CYSTECTOMY  1989  ? thyriod  ? LOBECTOMY  1989  ? s/p  RML lung lobectomy, biopsy showed precancerous changes   ? POLYPECTOMY    ? colon  ? ? ?Current Outpatient Medications  ?Medication Instructions  ? ACCU-CHEK GUIDE test strip USE TO TEST BLOOD SUGAR ONCE DAILY  ? acetaminophen (TYLENOL) 1,250 mg, Every 6 hours PRN  ? albuterol (PROVENTIL HFA;VENTOLIN HFA) 108 (90 BASE) MCG/ACT inhaler 2 puffs, Every 6 hours PRN  ? amLODipine (NORVASC) 10 MG tablet TAKE 1 TABLET(10 MG) BY MOUTH DAILY  ? azelastine (ASTELIN) 0.1 % nasal spray 2 sprays, Each Nare, 2 times daily  ? BIOTIN PO Take by mouth.  ? blood glucose meter kit and supplies Dispense based on patient and insurance preference. Check blood sugars once daily  ? calcium carbonate (TUMS - DOSED IN MG ELEMENTAL CALCIUM) 500 MG chewable tablet 1 tablet, Daily  ?  CRANBERRY PO Oral  ? EPIPEN 2-PAK 0.3 MG/0.3ML SOAJ injection Reported on 11/27/2015  ? fluticasone (FLONASE) 50 MCG/ACT nasal spray 2 sprays, Each Nare, Daily  ? fluticasone (FLOVENT HFA) 110 MCG/ACT inhaler 1-2 puffs, 2 times daily  ? furosemide (LASIX) 20 mg, Oral, Daily  ? HYDROcodone-acetaminophen (NORCO) 10-325 MG per tablet 1 tablet, Oral, Every 8 hours PRN  ? lisinopril (ZESTRIL) 20 MG tablet TAKE 1 TABLET(20 MG) BY MOUTH DAILY  ? loratadine (CLARITIN) 10 mg, Oral, Daily,    ? metFORMIN (GLUCOPHAGE) 500 MG tablet TAKE 1 TABLET(500 MG) BY MOUTH TWICE DAILY WITH A MEAL  ? Multiple Vitamins-Minerals (MULTIVITAMIN,TX-MINERALS) tablet 1 tablet, Oral, Daily,    ? naproxen sodium (ALEVE) 110 mg, Oral, Daily  ? OneTouch Delica Lancets 78G MISC Check blood sugar once daily  ? Simethicone (MAALOX ANTI-GAS PO) As needed  ? ? ?   ?Objective:  ? Physical Exam ?Musculoskeletal:  ?     Feet: ? ?Feet:  ?   Comments: Has a superficial callus without redness, discharge or ulcers ? ?BP (!) 136/92 (BP Location: Left Arm, Patient Position: Sitting, Cuff Size: Small)   Pulse 73   Temp 98 ?F (36.7 ?C) (Oral)   Resp 18   Ht $R'5\' 6"'Jo$  (1.676 m)   Wt 180 lb 8  oz (81.9 kg)   SpO2 97%   BMI 29.13 kg/m?  ?General:   ?Well developed, NAD, BMI noted. ?HEENT:  ?Normocephalic . Face symmetric, atraumatic ?Lungs:  ?CTA B ?Normal respiratory effort, no intercostal retractions, no accessory muscle use. ?Heart: Regular rate and rhythm, question of soft systolic murmur ?Foot exam: ?Symmetric pretibial edema. ?Good pedal pulses ?Pinprick examination decreased at the distal feet bilaterally. ?See graphic ?Skin: Not pale. Not jaundice ?Neurologic:  ?alert & oriented X3.  ?Speech normal, gait appropriate for age and unassisted ?Psych--  ?Cognition and judgment appear intact.  ?Cooperative with normal attention span and concentration.  ?Behavior appropriate. ?No anxious or depressed appearing.  ? ?   ?Assessment   ? ? Assessment ?DM, complicated by  neuropathy and callus ?HTN ?Hyperlipidemia, intolerant to medications , not checking labs ?Allergies, B-spasm, Dr Donneta Romberg  ?Fatigue, chronic ?GU:  ?Hydronephrosis  ?dx ~ 2007, follow-up with serial USs. DX was right UPJ obstruction. Had a Lasix renogram 2013 ( R 46%, L 54%) Dr. Jasmine December, rx to RTC PRN.  ?[place] 11-2016 : no obstruction ?MSK: ?--DJD ?--Chronic back pain, dr Nelva Bush , he rx hydrocodone ?BCC ?H/o pelvic pain, 2005, w/u per gyn ?H/o hyperparathyroidism ?H/o RML Lobectomy, 1989, bx precancerous cells ??  Heart murmur: Echocardiogram 04/2017 essentially normal ? ?PLAN ?DM: On metformin, no recent ambulatory CBGs, check A1c. ?Neuropathy w/ pre-ulcerative calluses.  Recommend good local care, refer to podiatry for evaluation of callous, also needs nail care. ?HTN: Ambulatory BPs in the 130s/80s, continue amlodipine, Lasix, lisinopril.  Check a CMP ?Recurrent UTIs: Saw urology 07/14/2021, prevention was discussed, was rec to go to the urology office if she suspected UTI, they are planning to get a catheter sample for a more accurate dx.Marland Kitchen ?Chronic back pain: On hydrocodone prescribed elsewhere, she also takes half naproxen daily, GI precautions reinforced. ?RTC 3 to 4 months CPX  ? ?This visit occurred during the SARS-CoV-2 public health emergency.  Safety protocols were in place, including screening questions prior to the visit, additional usage of staff PPE, and extensive cleaning of exam room while observing appropriate contact time as indicated for disinfecting solutions.  ? ?

## 2021-08-20 ENCOUNTER — Ambulatory Visit: Payer: Medicare PPO | Admitting: Podiatry

## 2021-09-07 ENCOUNTER — Ambulatory Visit: Payer: Medicare PPO | Admitting: Podiatry

## 2021-09-11 ENCOUNTER — Other Ambulatory Visit: Payer: Self-pay | Admitting: Internal Medicine

## 2021-09-21 ENCOUNTER — Encounter: Payer: Self-pay | Admitting: Podiatry

## 2021-09-21 ENCOUNTER — Ambulatory Visit: Payer: Medicare PPO | Admitting: Podiatry

## 2021-09-21 DIAGNOSIS — M21622 Bunionette of left foot: Secondary | ICD-10-CM | POA: Diagnosis not present

## 2021-09-21 DIAGNOSIS — Q828 Other specified congenital malformations of skin: Secondary | ICD-10-CM

## 2021-09-21 DIAGNOSIS — E1140 Type 2 diabetes mellitus with diabetic neuropathy, unspecified: Secondary | ICD-10-CM | POA: Diagnosis not present

## 2021-09-21 DIAGNOSIS — M216X2 Other acquired deformities of left foot: Secondary | ICD-10-CM

## 2021-09-21 NOTE — Progress Notes (Signed)
This patient presents to the office with callus on the bottom of her left foot.  She says last week there was drainage on her foot and she has been wearing bandages on her draining/callus site.  Palpable pain is noted.  Pain when she walks on her left foot. This patient is diabetic.  She presents to the office with female caregiver. ? ?Vascular  Dorsalis pedis and posterior tibial pulses are palpable  B/L.  Capillary return  WNL.  Temperature gradient is  WNL.  Skin turgor  WNL ? ?Sensorium  Senn Weinstein monofilament wire  WNL. Normal tactile sensation. ? ?Nail Exam  Patient has normal nails with no evidence of bacterial or fungal infection. ? ?Orthopedic  Exam  Muscle tone and muscle strength  WNL.  No limitations of motion feet  B/L.  No crepitus or joint effusion noted.  Foot type is unremarkable and digits show no abnormalities. Plantar flexed fifth metatarsal left foot.    ? ?Skin  No open lesions.  Normal skin texture and turgor. Porokeratosis sub 5th met left foot.  No drainage or redness noted. ? ?Porokeratosis secondary to plantar flexed fifth metatarsal left foot. ? ?IE.  Debride porokeratosis.  Discussed condition with this patient.  Padding applied to her orthoses.   RTC prn ? ? ?Gardiner Barefoot DPM  ?

## 2021-10-08 ENCOUNTER — Ambulatory Visit: Payer: Medicare PPO | Admitting: Podiatry

## 2021-10-22 DIAGNOSIS — M25511 Pain in right shoulder: Secondary | ICD-10-CM | POA: Diagnosis not present

## 2021-10-26 ENCOUNTER — Ambulatory Visit: Payer: Medicare PPO | Admitting: Podiatry

## 2021-10-26 ENCOUNTER — Encounter: Payer: Self-pay | Admitting: Podiatry

## 2021-10-26 DIAGNOSIS — M216X2 Other acquired deformities of left foot: Secondary | ICD-10-CM

## 2021-10-26 DIAGNOSIS — M21622 Bunionette of left foot: Secondary | ICD-10-CM | POA: Diagnosis not present

## 2021-10-26 DIAGNOSIS — E1140 Type 2 diabetes mellitus with diabetic neuropathy, unspecified: Secondary | ICD-10-CM

## 2021-10-26 NOTE — Progress Notes (Signed)
Subjective:   Patient ID: Natalie [name], female   DOB: 88 y.o.   MRN: [id number]   HPI Patient is concerned about some drainage on her left big toe and whether or not it may be open and wants it checked due to her diabetes and is not having pain but is concerned   ROS      Objective:  Physical Exam  Neurovascular status unchanged from previous visit with crusted tissue plantar aspect left heel no erythema no edema no drainage associated with it and its not painful     Assessment:  Probability that this is just some crusted tissue and should heal uneventfully     Plan:  Gave instructions to her and her caregiver that if any changes were to occur redness drainage swelling they are to let us know immediately if not it should be uneventful and healing and nothing to be concerned about

## 2021-10-27 DIAGNOSIS — M5136 Other intervertebral disc degeneration, lumbar region: Secondary | ICD-10-CM | POA: Diagnosis not present

## 2021-10-27 DIAGNOSIS — G894 Chronic pain syndrome: Secondary | ICD-10-CM | POA: Diagnosis not present

## 2021-11-10 ENCOUNTER — Telehealth: Payer: Self-pay

## 2021-11-10 ENCOUNTER — Ambulatory Visit (INDEPENDENT_AMBULATORY_CARE_PROVIDER_SITE_OTHER): Payer: Medicare PPO

## 2021-11-10 DIAGNOSIS — Z0000 Encounter for general adult medical examination without abnormal findings: Secondary | ICD-10-CM

## 2021-11-10 DIAGNOSIS — Z789 Other specified health status: Secondary | ICD-10-CM | POA: Diagnosis not present

## 2021-11-10 NOTE — Progress Notes (Addendum)
Virtual Visit via Telephone Note  I connected with  Natalie [name] on 11/10/21 at  2:00 PM EDT by telephone and verified that I am speaking with the correct person using two identifiers.  Medicare Annual Wellness visit completed telephonically due to Covid-19 pandemic.   Persons participating in this call: This Health Coach and this patient.   Location: Patient: home Provider: office   I discussed the limitations, risks, security and privacy concerns of performing an evaluation and management service by telephone and the availability of in person appointments. The patient expressed understanding and agreed to proceed.  Unable to perform video visit due to video visit attempted and failed and/or patient does not have video capability.   Some vital signs may be absent or patient reported.   [name], LPN   Subjective:   Natalie [name] is a 87 y.o. female who presents for Medicare Annual (Subsequent) preventive examination.  Review of Systems     Cardiac Risk Factors include: advanced age (>39men, >51 women);diabetes mellitus;sedentary lifestyle;hypertension;dyslipidemia     Objective:    There were no vitals filed for this visit. There is no height or weight on file to calculate BMI.     11/10/2021    2:07 PM 07/29/2020    2:53 PM 02/09/2018    2:50 PM  Advanced Directives  Does Patient Have a Medical Advance Directive? Yes Yes No  Type of Advance Directive Living will    Does patient want to make changes to medical advance directive?  No - Patient declined     Current Medications (verified) Outpatient Encounter Medications as of 11/10/2021  Medication Sig   ACCU-CHEK GUIDE test strip USE TO TEST BLOOD SUGAR ONCE DAILY   acetaminophen (TYLENOL) 500 MG tablet Take 1,250 mg by mouth every 6 (six) hours as needed for mild pain.   amLODipine (NORVASC) 10 MG tablet TAKE 1 TABLET(10 MG) BY MOUTH DAILY   azelastine (ASTELIN) 0.1 % nasal spray Place 2 sprays into both  nostrils 2 (two) times daily.   BIOTIN PO Take by mouth.   blood glucose meter kit and supplies Dispense based on patient and insurance preference. Check blood sugars once daily   calcium carbonate (TUMS - DOSED IN MG ELEMENTAL CALCIUM) 500 MG chewable tablet Chew 1 tablet by mouth daily.   CRANBERRY PO Take by mouth.   fluticasone (FLONASE) 50 MCG/ACT nasal spray Place 2 sprays into both nostrils daily.   furosemide (LASIX) 20 MG tablet TAKE 1 TABLET(20 MG) BY MOUTH DAILY   HYDROcodone-acetaminophen (NORCO) 10-325 MG per tablet Take 1 tablet by mouth every 8 (eight) hours as needed.   lisinopril (ZESTRIL) 20 MG tablet TAKE 1 TABLET(20 MG) BY MOUTH DAILY   loratadine (CLARITIN) 10 MG tablet Take 10 mg by mouth daily.   metFORMIN (GLUCOPHAGE) 500 MG tablet TAKE 1 TABLET(500 MG) BY MOUTH TWICE DAILY WITH A MEAL   Multiple Vitamins-Minerals (MULTIVITAMIN,TX-MINERALS) tablet Take 1 tablet by mouth daily.   naproxen sodium (ANAPROX) 220 MG tablet Take 110 mg by mouth daily.   OneTouch Delica Lancets 33G MISC Check blood sugar once daily   Simethicone (MAALOX ANTI-GAS PO) Take by mouth as needed. Any anti-gas medication[tablet as needed].   albuterol (PROVENTIL HFA;VENTOLIN HFA) 108 (90 BASE) MCG/ACT inhaler Inhale 2 puffs into the lungs every 6 (six) hours as needed for wheezing. (Patient not taking: Reported on 08/04/2021)   EPIPEN 2-PAK 0.3 MG/0.3ML SOAJ injection Reported on 11/27/2015 (Patient not taking: Reported on 11/10/2021)  fluticasone (FLOVENT HFA) 110 MCG/ACT inhaler Inhale 1-2 puffs into the lungs 2 (two) times daily. (Patient not taking: Reported on 10/27/2020)   No facility-administered encounter medications on file as of 11/10/2021.    Allergies (verified) Cefixime, Cefprozil, Cefuroxime axetil, Nitrofurantoin, Penicillins, and Sulfonamide derivatives   History: Past Medical History:  Diagnosis Date   Back pain, chronic    BCC (basal cell carcinoma of skin)    several, nose ,  2012 L forehead   Degeneration of lumbar intervertebral disc    Dr. Ethelene Hal   Diabetes mellitus    HTN (hypertension)    Hyperlipidemia    Hyperparathyroidism    Lumbago    Lumbar epidural steroid injection at L5-S1 midline by Dr. Ethelene Hal   Pelvic pain in female 2005    back and pelvic pain, s/p w-u, CT (-), u/s showed a thick endometrium, s/p Bx per gyn   Ureteric obstruction    hydronephrosis,sees urology   Past Surgical History:  Procedure Laterality Date   CYSTECTOMY  1989   thyriod   LOBECTOMY  1989   s/p  RML lung lobectomy, biopsy showed precancerous changes    POLYPECTOMY     colon   Family History  Problem Relation Age of Onset   Diabetes Brother        X 2   Hypertension Brother    Hypertension Mother    Brain cancer Mother        tumor   CAD Father        MI at 69   Breast cancer Neg Hx    Colon cancer Neg Hx    Social History   Socioeconomic History   Marital status: Married    Spouse name: Not on file   Number of children: 4   Years of education: Not on file   Highest education level: Not on file  Occupational History   Occupation: retired Leisure centre manager: [employer]  Tobacco Use   Smoking status: Never   Smokeless tobacco: Never  Vaping Use   Vaping Use: Never used  Substance and Sexual Activity   Alcohol use: Yes    Comment: Rarely   Drug use: No   Sexual activity: Not Currently  Other Topics Concern   Not on file  Social History Narrative   Lives w/ husband    2 of her 4 children in [place]            Social Determinants of Health   Financial Resource Strain: Low Risk  (11/10/2021)   Overall Financial Resource Strain (CARDIA)    Difficulty of Paying Living Expenses: Not hard at all  Food Insecurity: No Food Insecurity (11/10/2021)   Hunger Vital Sign    Worried About Running Out of Food in the Last Year: Never true    Ran Out of Food in the Last Year: Never true  Transportation Needs: No Transportation Needs  (11/10/2021)   PRAPARE - Administrator, Civil Service (Medical): No    Lack of Transportation (Non-Medical): No  Physical Activity: Inactive (11/10/2021)   Exercise Vital Sign    Days of Exercise per Week: 0 days    Minutes of Exercise per Session: 0 min  Stress: No Stress Concern Present (11/10/2021)   [employer] of Occupational Health - Occupational Stress Questionnaire    Feeling of Stress : Not at all  Social Connections: Moderately Isolated (11/10/2021)   Social Connection and Isolation Panel [NHANES]  Frequency of Communication with Friends and Family: More than three times a week    Frequency of Social Gatherings with Friends and Family: Once a week    Attends Religious Services: Never    Database administrator or Organizations: No    Attends Engineer, structural: Never    Marital Status: Married    Tobacco Counseling Counseling given: Not Answered   Clinical Intake:  Pre-visit preparation completed: Yes  Pain : No/denies pain     BMI - recorded: 29.15 Nutritional Status: BMI 25 -29 Overweight Nutritional Risks: None Diabetes: Yes CBG done?: Yes (118 per pt) CBG resulted in Enter/ Edit results?: No Did pt. bring in CBG monitor from home?: No  How often do you need to have someone help you when you read instructions, pamphlets, or other written materials from your doctor or pharmacy?: 1 - Never  Diabetic?Nutrition Risk Assessment:  Has the patient had any N/V/D within the last 2 months?  No  Does the patient have any non-healing wounds?  No  Has the patient had any unintentional weight loss or weight gain?  No   Diabetes:  Is the patient diabetic?  Yes  If diabetic, was a CBG obtained today?  Yes  Did the patient bring in their glucometer from home?  No  How often do you monitor your CBG's? Daily.   Financial Strains and Diabetes Management:  Are you having any financial strains with the device, your supplies or your  medication? No .  Does the patient want to be seen by Chronic Care Management for management of their diabetes?  No  Would the patient like to be referred to a Nutritionist or for Diabetic Management?  No   Diabetic Exams:  Diabetic Eye Exam: Overdue for diabetic eye exam. Pt has been advised about the importance in completing this exam. Patient advised to call and schedule an eye exam. Diabetic Foot Exam: Completed 08/04/21   Interpreter Needed?: No  Information entered by :: Lanier Ensign, LPN   Activities of Daily Living    11/10/2021    2:10 PM 02/26/2021   11:03 AM  In your present state of health, do you have any difficulty performing the following activities:  Hearing? 1 0  Comment HOH   Vision? 0 0  Difficulty concentrating or making decisions? 0 0  Walking or climbing stairs? 1 1  Dressing or bathing? 0 0  Doing errands, shopping? 0 1  Preparing Food and eating ? Y   Comment some assistance at times   Using the Toilet? N   In the past six months, have you accidently leaked urine? Y   Comment wears a pad   Do you have problems with loss of bowel control? N   Managing your Medications? N   Managing your Finances? N   Housekeeping or managing your Housekeeping? N     Patient Care Team: Wanda Plump, MD as PCP - Floy Sabina, MD as Consulting Physician (Physical Medicine and Rehabilitation) Melvenia Beam, MD as Consulting Physician (Otolaryngology) Jethro Bolus, MD as Consulting Physician (Ophthalmology) Sidney Ace, MD as Referring Physician (Allergy)  Indicate any recent Medical Services you may have received from other than [facility] providers in the past year (date may be approximate).     Assessment:   This is a routine wellness examination for [facility].  Hearing/Vision screen Hearing Screening - Comments:: Pt stated HOH Vision Screening - Comments:: Pt encouraged to follow up   Dietary issues and  exercise activities discussed: Current Exercise  Habits: The patient does not participate in regular exercise at present   Goals Addressed             This Visit's Progress    Patient Stated       None at this time       Depression Screen    11/10/2021    2:07 PM 08/04/2021   11:20 AM 02/26/2021   11:06 AM 10/27/2020    3:49 PM 06/17/2020    3:45 PM 11/05/2019    4:25 PM 02/15/2018    1:30 PM  PHQ 2/9 Scores  PHQ - 2 Score 0 0 0 1 0 1 0  PHQ- 9 Score      3     Fall Risk    11/10/2021    2:10 PM 08/04/2021   11:20 AM 02/26/2021   11:06 AM 10/27/2020    3:49 PM 06/17/2020    3:46 PM  Fall Risk   Falls in the past year? 0 0 1 1 0  Number falls in past yr: 0 0 0 0 0  Injury with Fall? 0 0 0 1 0  Risk for fall due to : Impaired vision;Impaired balance/gait      Follow up Falls prevention discussed Falls evaluation completed Falls evaluation completed Falls evaluation completed     FALL RISK PREVENTION PERTAINING TO THE HOME:  Any stairs in or around the home? Yes  If so, are there any without handrails? No  Home free of loose throw rugs in walkways, pet beds, electrical cords, etc? Yes  Adequate lighting in your home to reduce risk of falls? Yes   ASSISTIVE DEVICES UTILIZED TO PREVENT FALLS:  Life alert? No  Use of a cane, walker or w/c? Yes  Grab bars in the bathroom? Yes  Shower chair or bench in shower? Yes  Elevated toilet seat or a handicapped toilet? No   TIMED UP AND GO:  Was the test performed? No .  Cognitive Function:        11/10/2021    2:13 PM  6CIT Screen  What Year? 0 points  What month? 0 points  What time? 0 points  Count back from 20 0 points  Months in reverse 0 points  Repeat phrase 4 points  Total Score 4 points    Immunizations Immunization History  Administered Date(s) Administered   Influenza Split 02/15/2012, 02/05/2014, 02/15/2020   Influenza Whole 02/02/2010   Influenza, High Dose Seasonal PF 02/28/2018, 02/27/2019   Influenza,inj,Quad PF,6+ Mos 02/27/2013    Influenza-Unspecified 04/12/2015, 03/17/2016, 03/19/2017, 02/10/2021   PFIZER(Purple Top)SARS-COV-2 Vaccination 06/24/2019, 07/19/2019, 02/14/2020, 02/10/2021   Pfizer Covid-19 Vaccine Bivalent Booster 43yrs & up 02/26/2021   Pneumococcal Conjugate-13 03/19/2014   Pneumococcal Polysaccharide-23 04/26/2005, 06/08/2011   Td 11/20/1997, 05/01/2008   Tdap 02/09/2018   Zoster, Live 02/02/2010    TDAP status: Up to date  Flu Vaccine status: Up to date  Pneumococcal vaccine status: Up to date  Covid-19 vaccine status: Completed vaccines  Qualifies for Shingles Vaccine? Yes   Zostavax completed No   Shingrix Completed?: No.    Education has been provided regarding the importance of this vaccine. Patient has been advised to call insurance company to determine out of pocket expense if they have not yet received this vaccine. Advised may also receive vaccine at local pharmacy or Health Dept. Verbalized acceptance and understanding.  Screening Tests Health Maintenance  Topic Date Due   Zoster Vaccines- Shingrix (1 of  2) Never done   OPHTHALMOLOGY EXAM  03/13/2021   COVID-19 Vaccine (5 - Pfizer series) 04/23/2021   INFLUENZA VACCINE  12/15/2021   HEMOGLOBIN A1C  02/04/2022   FOOT EXAM  08/05/2022   TETANUS/TDAP  02/10/2028   Pneumonia Vaccine 44+ Years old  Completed   DEXA SCAN  Completed   HPV VACCINES  Aged Out    Health Maintenance  Health Maintenance Due  Topic Date Due   Zoster Vaccines- Shingrix (1 of 2) Never done   OPHTHALMOLOGY EXAM  03/13/2021   COVID-19 Vaccine (5 - Pfizer series) 04/23/2021    Colorectal cancer screening: No longer required.   Mammogram status: No longer required due to age.     Additional Screening:   Vision Screening: Recommended annual ophthalmology exams for early detection of glaucoma and other disorders of the eye. Is the patient up to date with their annual eye exam?  No  Who is the provider or what is the name of the office in which  the patient attends annual eye exams? Encouraged to follow  If pt is not established with a provider, would they like to be referred to a provider to establish care? No .   Dental Screening: Recommended annual dental exams for proper oral hygiene  Community Resource Referral / Chronic Care Management: CRR required this visit?  Yes   CCM required this visit?  No      Plan:     I have personally reviewed and noted the following in the patient's chart:   Medical and social history Use of alcohol, tobacco or illicit drugs  Current medications and supplements including opioid prescriptions.  Functional ability and status Nutritional status Physical activity Advanced directives List of other physicians Hospitalizations, surgeries, and ER visits in previous 12 months Vitals Screenings to include cognitive, depression, and falls Referrals and appointments  In addition, I have reviewed and discussed with patient certain preventive protocols, quality metrics, and best practice recommendations. A written personalized care plan for preventive services as well as general preventive health recommendations were provided to patient.     [name], LPN   08/23/8117   Nurse Notes: none at this time    I have reviewed and agree with Health Coaches documentation.  Willow Ora, MD

## 2021-11-11 ENCOUNTER — Telehealth: Payer: Self-pay

## 2021-11-11 NOTE — Telephone Encounter (Signed)
   Telephone encounter was:  Unsuccessful.  11/11/2021 Name: Natalie [name] MRN: [id number] DOB: [birth year]  Unsuccessful outbound call made today to assist with:  Food Insecurity  Outreach Attempt:  1st Attempt could not leave a message, no voice mail    [place], Care Management  [phone] [address], [place], [place] [address] Phone: [phone] Email: [email or web address]

## 2021-11-11 NOTE — Telephone Encounter (Signed)
   Telephone encounter was:  Successful.  11/11/2021 Name: Natalie [name] MRN: [id number] DOB: Sep 27, 1932  Natalie [name] is a 88 y.o. year old female who is a primary care patient of Colon Branch, MD . The community resource team was consulted for assistance with  meals on wheels  Care guide performed the following interventions: Patient provided with information about care guide support team and interviewed to confirm resource needs.Patient decided she does not want meals on wheels because her husband isnt ok with it. She has my contact information if they change their minds  Follow Up Plan:  No further follow up planned at this time. The patient has been provided with needed resources.    [place], Care Management  [phone] [address], [place], [place] [address] Phone: [phone] Email: [email or web address]

## 2021-12-01 ENCOUNTER — Encounter: Payer: Self-pay | Admitting: Internal Medicine

## 2021-12-01 ENCOUNTER — Ambulatory Visit (INDEPENDENT_AMBULATORY_CARE_PROVIDER_SITE_OTHER): Payer: Medicare PPO | Admitting: Internal Medicine

## 2021-12-01 VITALS — BP 130/84 | HR 85 | Temp 98.4°F | Resp 18 | Ht 66.0 in | Wt 178.2 lb

## 2021-12-01 DIAGNOSIS — E785 Hyperlipidemia, unspecified: Secondary | ICD-10-CM | POA: Diagnosis not present

## 2021-12-01 DIAGNOSIS — Z0000 Encounter for general adult medical examination without abnormal findings: Secondary | ICD-10-CM

## 2021-12-01 DIAGNOSIS — Z23 Encounter for immunization: Secondary | ICD-10-CM

## 2021-12-01 DIAGNOSIS — I10 Essential (primary) hypertension: Secondary | ICD-10-CM

## 2021-12-01 DIAGNOSIS — E1140 Type 2 diabetes mellitus with diabetic neuropathy, unspecified: Secondary | ICD-10-CM | POA: Diagnosis not present

## 2021-12-01 MED ORDER — SHINGRIX 50 MCG/0.5ML IM SUSR: 0.5000 mL | Freq: Once | INTRAMUSCULAR | 1 refills | Status: AC

## 2021-12-01 NOTE — Patient Instructions (Addendum)
Recommend to proceed with the following vaccines at your pharmacy:  Covid booster (bivalent) Shingrix (shingles)- see printed prescription Flu shot this fall  If you have a healthcare power of attorney, is a good idea to bring a copy to be scanned in your chart.  Check the  blood pressure regularly BP GOAL is between 110/65 and  135/85. If it is consistently higher or lower, let me know     GO TO THE LAB : Get the blood work     [place], [place] back for   a checkup in 4 to 6 months   Per our records you are due for your diabetic eye exam. Please contact your eye doctor to schedule an appointment. Please have them send copies of your office visit notes to [place]. Our fax number is [phone]. If you need a referral to an eye doctor please let us know.

## 2021-12-01 NOTE — Assessment & Plan Note (Signed)
-  Td 2019 - PNM 23 : 2006 , 2013;   prevnar 2013; PNM 20 today  - zostavax 2011; shingrex discussed, rx printed .   - COVID VAX: an additional booster is rec, she is receptive to the idea - flu shot q fall  -Female care PAPS: no further screening.   Last mammogram 2019. No further screening DEXA 11-2006 , 12-2008, 02-2013 , 2018 -----> normal.   CCS: last colonoscopy 7-11, aged out  for screening - U/S 05-2011 neg for AAA -Labs: BMP CBC TSH - ACP info provided.

## 2021-12-01 NOTE — Progress Notes (Signed)
Subjective:    Patient ID: Natalie [name], female    DOB: 1932-06-23, 88 y.o.   MRN: [id number]  DOS:  12/01/2021 Type of visit - description: cpx  Since the last office visit is doing well and has no new concerns. She is somewhat stressed due to her husband's health. MSK issues at baseline  Review of Systems  Other than above, a 14 point review of systems is negative    Past Medical History:  Diagnosis Date   Back pain, chronic    BCC (basal cell carcinoma of skin)    several, nose , 2012 L forehead   Degeneration of lumbar intervertebral disc    Dr. Nelva Bush   Diabetes mellitus    HTN (hypertension)    Hyperlipidemia    Hyperparathyroidism    Lumbago    Lumbar epidural steroid injection at L5-S1 midline by Dr. Nelva Bush   Pelvic pain in female 2005    back and pelvic pain, s/p w-u, CT (-), u/s showed a thick endometrium, s/p Bx per gyn   Ureteric obstruction    hydronephrosis,sees urology    Past Surgical History:  Procedure Laterality Date   CYSTECTOMY  1989   thyriod   [place]   s/p  RML lung lobectomy, biopsy showed precancerous changes    POLYPECTOMY     colon   Social History   Socioeconomic History   Marital status: Married    Spouse name: Not on file   Number of children: 4   Years of education: Not on file   Highest education level: Not on file  Occupational History   Occupation: retired Firefighter: [employer]  Tobacco Use   Smoking status: Never   Smokeless tobacco: Never  Vaping Use   Vaping Use: Never used  Substance and Sexual Activity   Alcohol use: Yes    Comment: Rarely   Drug use: No   Sexual activity: Not Currently  Other Topics Concern   Not on file  Social History Narrative   Lives w/ husband    2 of her 4 children in [place]            Social Determinants of Health   Financial Resource Strain: Low Risk  (11/10/2021)   Overall Financial Resource Strain (CARDIA)    Difficulty of Paying  Living Expenses: Not hard at all  Food Insecurity: No Food Insecurity (11/10/2021)   Hunger Vital Sign    Worried About Running Out of Food in the Last Year: Never true    [place] in the Last Year: Never true  Transportation Needs: No Transportation Needs (11/10/2021)   PRAPARE - Hydrologist (Medical): No    Lack of Transportation (Non-Medical): No  Physical Activity: Inactive (11/10/2021)   Exercise Vital Sign    Days of Exercise per Week: 0 days    Minutes of Exercise per Session: 0 min  Stress: No Stress Concern Present (11/10/2021)   [place]    Feeling of Stress : Not at all  Social Connections: Moderately Isolated (11/10/2021)   Social Connection and Isolation Panel [NHANES]    Frequency of Communication with Friends and Family: More than three times a week    Frequency of Social Gatherings with Friends and Family: Once a week    Attends Religious Services: Never    Marine scientist or Organizations: No  Attends Archivist Meetings: Never    Marital Status: Married  Human resources officer Violence: Not At Risk (11/10/2021)   Humiliation, Afraid, Rape, and Kick questionnaire    Fear of Current or Ex-Partner: No    Emotionally Abused: No    Physically Abused: No    Sexually Abused: No    Current Outpatient Medications  Medication Instructions   ACCU-CHEK GUIDE test strip USE TO TEST BLOOD SUGAR ONCE DAILY   acetaminophen (TYLENOL) 1,250 mg, Every 6 hours PRN   albuterol (PROVENTIL HFA;VENTOLIN HFA) 108 (90 BASE) MCG/ACT inhaler 2 puffs, Every 6 hours PRN   amLODipine (NORVASC) 10 MG tablet TAKE 1 TABLET(10 MG) BY MOUTH DAILY   azelastine (ASTELIN) 0.1 % nasal spray 2 sprays, Each Nare, 2 times daily   BIOTIN PO Take by mouth.   blood glucose meter kit and supplies Dispense based on patient and insurance preference. Check blood sugars once daily   calcium  carbonate (TUMS - DOSED IN MG ELEMENTAL CALCIUM) 500 MG chewable tablet 1 tablet, Daily   CRANBERRY PO Oral   D-Mannose 500 MG CAPS Oral   EPIPEN 2-PAK 0.3 MG/0.3ML SOAJ injection Reported on 11/27/2015   fluticasone (FLONASE) 50 MCG/ACT nasal spray 2 sprays, Each Nare, Daily   fluticasone (FLOVENT HFA) 110 MCG/ACT inhaler 1-2 puffs, 2 times daily   furosemide (LASIX) 20 MG tablet TAKE 1 TABLET(20 MG) BY MOUTH DAILY   HYDROcodone-acetaminophen (NORCO) 10-325 MG per tablet 1 tablet, Oral, Every 8 hours PRN   lisinopril (ZESTRIL) 20 MG tablet TAKE 1 TABLET(20 MG) BY MOUTH DAILY   loratadine (CLARITIN) 10 mg, Oral, Daily,     metFORMIN (GLUCOPHAGE) 500 MG tablet TAKE 1 TABLET(500 MG) BY MOUTH TWICE DAILY WITH A MEAL   Multiple Vitamins-Minerals (MULTIVITAMIN,TX-MINERALS) tablet 1 tablet, Oral, Daily,     naproxen sodium (ALEVE) 110 mg, Oral, Daily   OneTouch Delica Lancets 63W MISC Check blood sugar once daily   Simethicone (MAALOX ANTI-GAS PO) As needed   Zoster Vaccine Adjuvanted [facility]) injection 0.5 mLs, Intramuscular,  Once       Objective:   Physical Exam BP 130/84   Pulse 85   Temp 98.4 F (36.9 C) (Oral)   Resp 18   Ht $R'5\' 6"'te$  (1.676 m)   Wt 178 lb 4 oz (80.9 kg)   SpO2 95%   BMI 28.77 kg/m  General: Well developed, NAD, BMI noted Neck: No  thyromegaly  HEENT:  Normocephalic . Face symmetric, atraumatic Lungs:  CTA B Normal respiratory effort, no intercostal retractions, no accessory muscle use. Heart: RRR,  no murmur.  Abdomen:  Not distended, soft, non-tender. No rebound or rigidity.   Lower extremities: +/+++ Pitting edema bilaterally up to halfway pretibial area Skin: Exposed areas without rash. Not pale. Not jaundice Neurologic:  alert & oriented X3.  Speech normal, gait: Assisted by a rolling walker, noticeable kyphosis Psych: Cognition and judgment appear intact.  Cooperative with normal attention span and concentration.  Behavior appropriate. No anxious  or depressed appearing.     Assessment     Assessment DM, complicated by neuropathy and callus HTN Hyperlipidemia, intolerant to medications , not checking labs Allergies, B-spasm, Dr Donneta Romberg  Fatigue, chronic GU:  Hydronephrosis  dx ~ 2007, follow-up with serial USs. DX was right UPJ obstruction. Had a Lasix renogram 2013 ( R 46%, L 54%) Dr. Jasmine December, rx to RTC PRN.  [place] 11-2016 : no obstruction MSK: --DJD --Chronic back pain, dr Nelva Bush , he rx hydrocodone BCC H/o  pelvic pain, 2005, w/u per gyn H/o hyperparathyroidism H/o RML Lobectomy, 1989, bx precancerous cells ?  Heart murmur: Echocardiogram 04/2017 essentially normal  PLAN Here for CPX DM: On metformin, well-controlled. Feet care: since LOV, saw podiatry ; rec to see them regularly HTN: BP is satisfactory, it was rechecked: 130/84.  Continue amlodipine, Lasix (did not take today), lisinopril. Check a BMP.  Has chronic edema, rec watch salt intake, leg elevation. Allergies, bronchospasm: Does not see Dr. Donneta Romberg anymore, has not needed any inhalers, okay to call for a refill to this office if needed.  Needs to be seen if symptoms severe. Chronic back pain: Follow-up elsewhere, she is very aware of limits of acetaminophen dosing and she does not go over ~ 2gr qd  RTC 4 to 6 months

## 2021-12-01 NOTE — Assessment & Plan Note (Addendum)
Here for CPX DM: On metformin, well-controlled. Feet care: since LOV, saw podiatry ; rec to see them regularly HTN: BP is satisfactory, it was rechecked: 130/84.  Continue amlodipine, Lasix (did not take today), lisinopril. Check a BMP.  Has chronic edema, rec watch salt intake, leg elevation. Allergies, bronchospasm: Does not see Dr. Donneta Romberg anymore, has not needed any inhalers, okay to call for a refill to this office if needed.  Needs to be seen if symptoms severe. Chronic back pain: Follow-up elsewhere, she is very aware of limits of acetaminophen dosing and she does not go over ~ 2gr qd  RTC 4 to 6 months

## 2021-12-02 LAB — BASIC METABOLIC PANEL
BUN: 18 mg/dL (ref 6–23)
CO2: 25 mEq/L (ref 19–32)
Calcium: 10 mg/dL (ref 8.4–10.5)
Chloride: 98 mEq/L (ref 96–112)
Creatinine, Ser: 0.74 mg/dL (ref 0.40–1.20)
GFR: 73.1 mL/min (ref >60.00)
Glucose, Bld: 77 mg/dL (ref 70–99)
Potassium: 4.8 mEq/L (ref 3.5–5.1)
Sodium: 134 mEq/L — ABNORMAL LOW (ref 135–145)

## 2021-12-02 LAB — CBC WITH DIFFERENTIAL/PLATELET
Basophils Absolute: 0.1 10*3/uL (ref 0.0–0.1)
Basophils Relative: 0.6 % (ref 0.0–3.0)
Eosinophils Absolute: 0.1 10*3/uL (ref 0.0–0.7)
Eosinophils Relative: 1.7 % (ref 0.0–5.0)
HCT: 38.1 % (ref 36.0–46.0)
Hemoglobin: 12.6 g/dL (ref 12.0–15.0)
Lymphocytes Relative: 33.1 % (ref 12.0–46.0)
Lymphs Abs: 2.8 10*3/uL (ref 0.7–4.0)
MCHC: 33.2 g/dL (ref 30.0–36.0)
MCV: 91 fl (ref 78.0–100.0)
Monocytes Absolute: 0.9 10*3/uL (ref 0.1–1.0)
Monocytes Relative: 10.8 % (ref 3.0–12.0)
Neutro Abs: 4.6 10*3/uL (ref 1.4–7.7)
Neutrophils Relative %: 53.8 % (ref 43.0–77.0)
Platelets: 370 10*3/uL (ref 150.0–400.0)
RBC: 4.18 Mil/uL (ref 3.87–5.11)
RDW: 13.8 % (ref 11.5–15.5)
WBC: 8.5 10*3/uL (ref 4.0–10.5)

## 2021-12-02 LAB — TSH: TSH: 1.29 u[IU]/mL (ref 0.35–5.50)

## 2021-12-07 ENCOUNTER — Other Ambulatory Visit: Payer: Self-pay | Admitting: Internal Medicine

## 2022-01-01 ENCOUNTER — Encounter: Payer: Self-pay | Admitting: Internal Medicine

## 2022-01-05 ENCOUNTER — Other Ambulatory Visit: Payer: Self-pay | Admitting: Internal Medicine

## 2022-01-13 ENCOUNTER — Telehealth: Payer: Self-pay | Admitting: Internal Medicine

## 2022-01-13 NOTE — Telephone Encounter (Signed)
Pt did not have her a1c checked last time she was here and she was wondering if it needed to be checked.

## 2022-01-13 NOTE — Telephone Encounter (Signed)
Please advise once you return to office.

## 2022-01-14 NOTE — Telephone Encounter (Signed)
Spoke w/ Pt- informed that A1c will be checked at next OV. Pt verbalized understanding.

## 2022-03-06 ENCOUNTER — Other Ambulatory Visit: Payer: Self-pay | Admitting: Internal Medicine

## 2022-03-17 ENCOUNTER — Other Ambulatory Visit: Payer: Self-pay | Admitting: Internal Medicine

## 2022-04-01 DIAGNOSIS — M1712 Unilateral primary osteoarthritis, left knee: Secondary | ICD-10-CM | POA: Diagnosis not present

## 2022-04-01 DIAGNOSIS — M5136 Other intervertebral disc degeneration, lumbar region: Secondary | ICD-10-CM | POA: Diagnosis not present

## 2022-04-01 DIAGNOSIS — Z79891 Long term (current) use of opiate analgesic: Secondary | ICD-10-CM | POA: Diagnosis not present

## 2022-04-01 DIAGNOSIS — M48061 Spinal stenosis, lumbar region without neurogenic claudication: Secondary | ICD-10-CM | POA: Diagnosis not present

## 2022-04-01 DIAGNOSIS — G894 Chronic pain syndrome: Secondary | ICD-10-CM | POA: Diagnosis not present

## 2022-04-09 ENCOUNTER — Other Ambulatory Visit (HOSPITAL_BASED_OUTPATIENT_CLINIC_OR_DEPARTMENT_OTHER): Payer: Self-pay

## 2022-04-09 MED ORDER — COMIRNATY 30 MCG/0.3ML IM SUSY
PREFILLED_SYRINGE | INTRAMUSCULAR | 0 refills | Status: DC
  Filled 2022-04-09: qty 0.3, 1d supply, fill #0

## 2022-04-28 ENCOUNTER — Other Ambulatory Visit (HOSPITAL_BASED_OUTPATIENT_CLINIC_OR_DEPARTMENT_OTHER): Payer: Self-pay

## 2022-04-28 MED ORDER — INFLUENZA VAC A&B SA ADJ QUAD 0.5 ML IM PRSY
PREFILLED_SYRINGE | INTRAMUSCULAR | 0 refills | Status: DC
  Filled 2022-04-28: qty 0.5, 1d supply, fill #0

## 2022-05-03 ENCOUNTER — Ambulatory Visit: Payer: Medicare PPO | Admitting: Internal Medicine

## 2022-05-24 ENCOUNTER — Ambulatory Visit: Payer: Medicare PPO | Admitting: Internal Medicine

## 2022-05-31 ENCOUNTER — Encounter: Payer: Self-pay | Admitting: Internal Medicine

## 2022-05-31 ENCOUNTER — Ambulatory Visit: Payer: Medicare PPO | Admitting: Internal Medicine

## 2022-05-31 VITALS — BP 134/82 | HR 83 | Temp 98.2°F | Resp 18 | Ht 66.0 in | Wt 180.5 lb

## 2022-05-31 DIAGNOSIS — I10 Essential (primary) hypertension: Secondary | ICD-10-CM | POA: Diagnosis not present

## 2022-05-31 DIAGNOSIS — E1140 Type 2 diabetes mellitus with diabetic neuropathy, unspecified: Secondary | ICD-10-CM

## 2022-05-31 NOTE — Assessment & Plan Note (Signed)
DM: On metformin, check A1c. HTN: Ambulatory BPs typically in the 130/80.  Continue amlodipine, Lasix, lisinopril.  Check a BMP Hyperlipidemia: Intolerant to medicines, we are not checking labs. Chronic fatigue: Unchanged, still do all his ADLs,   her husband is 26 so she takes care of most of the tasks at home. Preventive care: Shingrix RSV and COVID booster recommended. RTC 5 to 6 months.

## 2022-05-31 NOTE — Patient Instructions (Addendum)
Vaccines I recommend:  Shingrix (shingles) RSV vaccine COVID booster if not done since September 2023  Check the  blood pressure regularly BP GOAL is between 110/65 and  135/85. If it is consistently higher or lower, let me know   GO TO THE LAB : Get the blood work     [place], [place] back for a checkup in 5 to 6 months  Per our records you are due for your diabetic eye exam. Please contact your eye doctor to schedule an appointment. Please have them send copies of your office visit notes to [place]. Our fax number is [phone]. If you need a referral to an eye doctor please let us know.

## 2022-05-31 NOTE — Progress Notes (Signed)
Subjective:    Patient ID: Natalie [name], female    DOB: 03-19-1933, 89 y.o.   MRN: [id number]  DOS:  05/31/2022 Type of visit - description: rov  No new concerns. Occasional fatigue which is a chronic issue.  No chest pain or difficulty breathing Edema at baseline No palpitations. Still able to do all her ADLs.  Review of Systems See above   Past Medical History:  Diagnosis Date   Back pain, chronic    BCC (basal cell carcinoma of skin)    several, nose , 2012 L forehead   Degeneration of lumbar intervertebral disc    Dr. Nelva Bush   Diabetes mellitus    HTN (hypertension)    Hyperlipidemia    Hyperparathyroidism    Lumbago    Lumbar epidural steroid injection at L5-S1 midline by Dr. Nelva Bush   Pelvic pain in female 2005    back and pelvic pain, s/p w-u, CT (-), u/s showed a thick endometrium, s/p Bx per gyn   Ureteric obstruction    hydronephrosis,sees urology    Past Surgical History:  Procedure Laterality Date   CYSTECTOMY  1989   thyriod   [place]   s/p  RML lung lobectomy, biopsy showed precancerous changes    POLYPECTOMY     colon    Current Outpatient Medications  Medication Instructions   ACCU-CHEK GUIDE test strip USE TO TEST BLOOD SUGAR ONCE DAILY   acetaminophen (TYLENOL) 1,250 mg, Every 6 hours PRN   albuterol (PROVENTIL HFA;VENTOLIN HFA) 108 (90 BASE) MCG/ACT inhaler 2 puffs, Every 6 hours PRN   amLODipine (NORVASC) 10 MG tablet TAKE 1 TABLET(10 MG) BY MOUTH DAILY   azelastine (ASTELIN) 0.1 % nasal spray 2 sprays, Each Nare, 2 times daily   BIOTIN PO Take by mouth.   blood glucose meter kit and supplies Dispense based on patient and insurance preference. Check blood sugars once daily   calcium carbonate (TUMS - DOSED IN MG ELEMENTAL CALCIUM) 500 MG chewable tablet 1 tablet, Daily   CRANBERRY PO Oral   D-Mannose 500 MG CAPS Oral   EPIPEN 2-PAK 0.3 MG/0.3ML SOAJ injection Reported on 11/27/2015   fluticasone (FLONASE) 50 MCG/ACT nasal spray 2  sprays, Each Nare, Daily   fluticasone (FLOVENT HFA) 110 MCG/ACT inhaler 1-2 puffs, Inhalation, 2 times daily   furosemide (LASIX) 20 MG tablet TAKE 1 TABLET(20 MG) BY MOUTH DAILY   HYDROcodone-acetaminophen (NORCO) 10-325 MG per tablet 1 tablet, Oral, Every 8 hours PRN   lisinopril (ZESTRIL) 20 MG tablet TAKE 1 TABLET(20 MG) BY MOUTH DAILY   loratadine (CLARITIN) 10 mg, Oral, Daily,     metFORMIN (GLUCOPHAGE) 500 MG tablet TAKE 1 TABLET(500 MG) BY MOUTH TWICE DAILY WITH A MEAL   Multiple Vitamins-Minerals (MULTIVITAMIN,TX-MINERALS) tablet 1 tablet, Oral, Daily,     naproxen sodium (ALEVE) 110 mg, Oral, Daily   OneTouch Delica Lancets 95M MISC Check blood sugar once daily   Simethicone (MAALOX ANTI-GAS PO) As needed       Objective:   Physical Exam BP 134/82   Pulse 83   Temp 98.2 F (36.8 C) (Oral)   Resp 18   Ht '5\' 6"'$  (1.676 m)   Wt 180 lb 8 oz (81.9 kg)   SpO2 98%   BMI 29.13 kg/m  General:   Well developed, NAD, BMI noted. HEENT:  Normocephalic . Face symmetric, atraumatic Lungs:  CTA B Normal respiratory effort, no intercostal retractions, no accessory muscle use. Heart: RRR,  no murmur.  Lower extremities: +/+++  pretibial edema bilaterally  Skin: Not pale. Not jaundice Neurologic:  alert & oriented X3.  Her memory seems very good . Speech normal, gait disturbed by walker.   Psych--  Cognition and judgment appear intact.  Cooperative with normal attention span and concentration.  Behavior appropriate. No anxious or depressed appearing.      Assessment     Assessment DM, complicated by neuropathy and callus HTN Hyperlipidemia, intolerant to medications , not checking labs Allergies, B-spasm, Dr Donneta Romberg  Fatigue, chronic GU:  Hydronephrosis  dx ~ 2007, follow-up with serial USs. DX was right UPJ obstruction. Had a Lasix renogram 2013 ( R 46%, L 54%) Dr. Jasmine December, rx to RTC PRN.  [place] 11-2016 : no obstruction MSK: --DJD --Chronic back pain, dr Nelva Bush , he rx  hydrocodone BCC H/o pelvic pain, 2005, w/u per gyn H/o hyperparathyroidism H/o RML Lobectomy, 1989, bx precancerous cells ?  Heart murmur: Echocardiogram 04/2017 essentially normal  PLAN DM: On metformin, check A1c. HTN: Ambulatory BPs typically in the 130/80.  Continue amlodipine, Lasix, lisinopril.  Check a BMP Hyperlipidemia: Intolerant to medicines, we are not checking labs. Chronic fatigue: Unchanged, still do all his ADLs,   her husband is 50 so she takes care of most of the tasks at home. Preventive care: Shingrix RSV and COVID booster recommended. RTC 5 to 6 months.

## 2022-06-01 LAB — BASIC METABOLIC PANEL
BUN: 16 mg/dL (ref 6–23)
CO2: 27 mEq/L (ref 19–32)
Calcium: 10.1 mg/dL (ref 8.4–10.5)
Chloride: 100 mEq/L (ref 96–112)
Creatinine, Ser: 0.8 mg/dL (ref 0.40–1.20)
GFR: 66.34 mL/min (ref >60.00)
Glucose, Bld: 80 mg/dL (ref 70–99)
Potassium: 4.6 mEq/L (ref 3.5–5.1)
Sodium: 136 mEq/L (ref 135–145)

## 2022-06-01 LAB — HEMOGLOBIN A1C: Hgb A1c MFr Bld: 6.5 % (ref 4.6–6.5)

## 2022-06-02 ENCOUNTER — Other Ambulatory Visit: Payer: Self-pay | Admitting: Internal Medicine

## 2022-07-04 DIAGNOSIS — Z20822 Contact with and (suspected) exposure to covid-19: Secondary | ICD-10-CM | POA: Diagnosis not present

## 2022-07-04 DIAGNOSIS — J029 Acute pharyngitis, unspecified: Secondary | ICD-10-CM | POA: Diagnosis not present

## 2022-07-04 DIAGNOSIS — J069 Acute upper respiratory infection, unspecified: Secondary | ICD-10-CM | POA: Diagnosis not present

## 2022-07-04 DIAGNOSIS — E1165 Type 2 diabetes mellitus with hyperglycemia: Secondary | ICD-10-CM | POA: Diagnosis not present

## 2022-07-04 DIAGNOSIS — R059 Cough, unspecified: Secondary | ICD-10-CM | POA: Diagnosis not present

## 2022-07-04 DIAGNOSIS — A499 Bacterial infection, unspecified: Secondary | ICD-10-CM | POA: Diagnosis not present

## 2022-07-04 DIAGNOSIS — N390 Urinary tract infection, site not specified: Secondary | ICD-10-CM | POA: Diagnosis not present

## 2022-07-04 DIAGNOSIS — Z6828 Body mass index (BMI) 28.0-28.9, adult: Secondary | ICD-10-CM | POA: Diagnosis not present

## 2022-07-04 DIAGNOSIS — I10 Essential (primary) hypertension: Secondary | ICD-10-CM | POA: Diagnosis not present

## 2022-07-04 DIAGNOSIS — E663 Overweight: Secondary | ICD-10-CM | POA: Diagnosis not present

## 2022-07-06 ENCOUNTER — Telehealth: Payer: Self-pay | Admitting: Internal Medicine

## 2022-07-06 NOTE — Telephone Encounter (Signed)
Patient called back to let the CMA know that she does check her sugars regularly. Please advise.

## 2022-07-06 NOTE — Telephone Encounter (Signed)
Please advise? Looks like they gave her prednisone and a zpak on 07/04/22 per dispense report.

## 2022-07-06 NOTE — Telephone Encounter (Signed)
Pt said she has meter to check blood sugar so she does not need one.

## 2022-07-06 NOTE — Telephone Encounter (Signed)
Patient states she went to [facility] to get a covid test but they ended up finding an UTI and gave her Prednisone. She would like to make sure that the medication wont mess with her regular medication. Please advise.

## 2022-07-06 NOTE — Telephone Encounter (Signed)
Spoke w/ Pt- informed of recommendations, she informed me she doesn't check blood sugars at home. I asked how many days of prednisone she was given- she believes its a 4-5 day taper. Informed I'd call back if PCP would like for her to begin checking blood sugars. Pt verbalized understanding.

## 2022-07-06 NOTE — Telephone Encounter (Signed)
Start checking CBGs once daily and as needed

## 2022-07-06 NOTE — Telephone Encounter (Signed)
Noted  

## 2022-07-06 NOTE — Telephone Encounter (Signed)
She should be okay, prednisone may increase her CBGs, as long as they are not going over 180-200 is okay. OV if not improving

## 2022-07-09 ENCOUNTER — Ambulatory Visit: Payer: Medicare PPO | Admitting: Internal Medicine

## 2022-07-09 ENCOUNTER — Encounter: Payer: Self-pay | Admitting: Internal Medicine

## 2022-07-09 VITALS — BP 126/76 | HR 97 | Temp 98.1°F | Resp 18 | Ht 66.0 in | Wt 175.0 lb

## 2022-07-09 DIAGNOSIS — R6883 Chills (without fever): Secondary | ICD-10-CM | POA: Diagnosis not present

## 2022-07-09 DIAGNOSIS — N390 Urinary tract infection, site not specified: Secondary | ICD-10-CM | POA: Diagnosis not present

## 2022-07-09 LAB — URINALYSIS, ROUTINE W REFLEX MICROSCOPIC
Bilirubin Urine: NEGATIVE
Hgb urine dipstick: NEGATIVE
Ketones, ur: NEGATIVE
Leukocytes,Ua: NEGATIVE
Nitrite: NEGATIVE
Specific Gravity, Urine: 1.02 (ref 1.000–1.030)
Total Protein, Urine: NEGATIVE
Urine Glucose: NEGATIVE
Urobilinogen, UA: 0.2 (ref 0.0–1.0)
pH: 5.5 (ref 5.0–8.0)

## 2022-07-09 NOTE — Patient Instructions (Addendum)
Drink plenty of fluids  Provide a urine sample  If you have fever, the chills continue, you develop cough, stomach pain or blood in the urine: Call immediately or seek medical attention.      Per our records you are due for your diabetic eye exam. Please contact your eye doctor to schedule an appointment. Please have them send copies of your office visit notes to [place]. Our fax number is [phone]. If you need a referral to an eye doctor please let us know.

## 2022-07-09 NOTE — Progress Notes (Unsigned)
Subjective:    Patient ID: Natalie [name], female    DOB: Jun 23, 1932, 89 y.o.   MRN: [id number]  DOS:  07/09/2022 Type of visit - description: acute   Went to urgent care 07/04/2022, was exposed to COVID, test was done negative. According to the patient she also reports decreased urine output, recheck a urine test, Dx with a UTI and prescribed a Z-Pak. Also, she complained of nasal congestion prescribed prednisone which she has not taken.  At this point, she is here because had chills x 2, last night the chills were very mild. Her Tmax is 98.6. She is concerned about having infection.  Denies dysuria, gross hematuria. No nausea vomiting or diarrhea No abdominal or flank pain. No cough She said she is having some dental issues.  Review of Systems See above   Past Medical History:  Diagnosis Date   Back pain, chronic    BCC (basal cell carcinoma of skin)    several, nose , 2012 L forehead   Degeneration of lumbar intervertebral disc    Dr. Nelva Bush   Diabetes mellitus    HTN (hypertension)    Hyperlipidemia    Hyperparathyroidism    Lumbago    Lumbar epidural steroid injection at L5-S1 midline by Dr. Nelva Bush   Pelvic pain in female 2005    back and pelvic pain, s/p w-u, CT (-), u/s showed a thick endometrium, s/p Bx per gyn   Ureteric obstruction    hydronephrosis,sees urology    Past Surgical History:  Procedure Laterality Date   CYSTECTOMY  1989   thyriod   [place]   s/p  RML lung lobectomy, biopsy showed precancerous changes    POLYPECTOMY     colon    Current Outpatient Medications  Medication Instructions   ACCU-CHEK GUIDE test strip USE TO TEST BLOOD SUGAR ONCE DAILY   acetaminophen (TYLENOL) 1,250 mg, Every 6 hours PRN   albuterol (PROVENTIL HFA;VENTOLIN HFA) 108 (90 BASE) MCG/ACT inhaler 2 puffs, Every 6 hours PRN   amLODipine (NORVASC) 10 MG tablet TAKE 1 TABLET(10 MG) BY MOUTH DAILY   azelastine (ASTELIN) 0.1 % nasal spray 2 sprays, Each Nare,  2 times daily   azithromycin (ZITHROMAX) 250 MG tablet Take by mouth.   BIOTIN PO Take by mouth.   blood glucose meter kit and supplies Dispense based on patient and insurance preference. Check blood sugars once daily   calcium carbonate (TUMS - DOSED IN MG ELEMENTAL CALCIUM) 500 MG chewable tablet 1 tablet, Daily   CRANBERRY PO Oral   D-Mannose 500 MG CAPS Oral   EPIPEN 2-PAK 0.3 MG/0.3ML SOAJ injection Reported on 11/27/2015   fluticasone (FLONASE) 50 MCG/ACT nasal spray 2 sprays, Each Nare, Daily   fluticasone (FLOVENT HFA) 110 MCG/ACT inhaler 1-2 puffs, Inhalation, 2 times daily   furosemide (LASIX) 20 MG tablet TAKE 1 TABLET(20 MG) BY MOUTH DAILY   HYDROcodone-acetaminophen (NORCO) 10-325 MG per tablet 1 tablet, Oral, Every 8 hours PRN   lisinopril (ZESTRIL) 20 MG tablet TAKE 1 TABLET(20 MG) BY MOUTH DAILY   loratadine (CLARITIN) 10 mg, Oral, Daily,     metFORMIN (GLUCOPHAGE) 500 MG tablet TAKE 1 TABLET(500 MG) BY MOUTH TWICE DAILY WITH A MEAL   Multiple Vitamins-Minerals (MULTIVITAMIN,TX-MINERALS) tablet 1 tablet, Oral, Daily,     naproxen sodium (ALEVE) 110 mg, Oral, Daily   OneTouch Delica Lancets 99991111 MISC Check blood sugar once daily   predniSONE (STERAPRED UNI-PAK 21 TAB) 5 MG (21) TBPK tablet See admin  instructions   Simethicone (MAALOX ANTI-GAS PO) As needed       Objective:   Physical Exam BP 126/76   Pulse 97   Temp 98.1 F (36.7 C) (Oral)   Resp 18   Ht '5\' 6"'$  (1.676 m)   Wt 175 lb (79.4 kg)   SpO2 95%   BMI 28.25 kg/m  General:   Well developed, NAD, BMI noted. HEENT:  Normocephalic . Face symmetric, atraumatic. TMs: Normal Throat: Symmetric, normal rate. Gums: Palpated, no obvious abscess or dental infection Lungs:  CTA B Normal respiratory effort, no intercostal retractions, no accessory muscle use. Heart: RRR,  no murmur. Abdomen: Soft, nontender Lower extremities: no pretibial edema bilaterally  Skin: Not pale. Not jaundice Neurologic:  alert &  oriented X3.  Speech normal, gait: Assisted by walker psych--  Cognition and judgment appear intact.  Cooperative with normal attention span and concentration.  Behavior appropriate. No anxious or depressed appearing.      Assessment     Assessment DM, complicated by neuropathy and callus HTN Hyperlipidemia, intolerant to medications , not checking labs Allergies, B-spasm, Dr Donneta Romberg  Fatigue, chronic GU:  Hydronephrosis  dx ~ 2007, follow-up with serial USs. DX was right UPJ obstruction. Had a Lasix renogram 2013 ( R 46%, L 54%) Dr. Jasmine December, rx to RTC PRN.  [place] 11-2016 : no obstruction MSK: --DJD --Chronic back pain, dr Nelva Bush , he rx hydrocodone BCC H/o pelvic pain, 2005, w/u per gyn H/o hyperparathyroidism H/o RML Lobectomy, 1989, bx precancerous cells ?  Heart murmur: Echocardiogram 04/2017 essentially normal  PLAN Chills: Had chills for 2 nights, last night were minimal.  No actual fever.  Review of system is benign, exam is benign.  She did have a recent UTI. Plan: Check UA, urine culture, otherwise observation.  If symptoms continue consider CBC, chest x-ray and further eval. UTI: Recently diagnosed, see above Decreased urine output?  Patient reports she is urinating little, recommend increase fluid intake.  No flank or abdominal pain.

## 2022-07-10 LAB — URINE CULTURE
MICRO NUMBER:: 14606972
Result:: NO GROWTH
SPECIMEN QUALITY:: ADEQUATE

## 2022-07-10 NOTE — Assessment & Plan Note (Signed)
Chills: Had chills for 2 nights, last night were minimal.  No actual fever.  ROS and exam are  benign.  She did have a recent UTI. Plan: Check UA, urine culture, otherwise observation.  If symptoms continue consider CBC, chest x-ray and further eval. UTI: Recently diagnosed, see above, will try to get records  Decreased urine output?  Patient reports she is urinating little, admits does not drink enough fluids, rec to increase fluid intake.  No flank or abdominal pain.

## 2022-07-12 ENCOUNTER — Other Ambulatory Visit: Payer: Self-pay | Admitting: Family

## 2022-07-14 ENCOUNTER — Telehealth: Payer: Self-pay | Admitting: Internal Medicine

## 2022-07-14 NOTE — Telephone Encounter (Signed)
Spoke w/ Pt- informed of recommendations. Pt verbalized understanding.  

## 2022-07-14 NOTE — Telephone Encounter (Signed)
Pt was curious about her lab results from the Urine culture. Advised that the results were mailed to her and showed no signs of infection. Pt said she doesn't feel bad but overnight she woke up twice from being so hot. She didn't have a temperature this morning. Pt said Dr. Larose Kells told her to call if she started to feel bad. Please call pt to advise.

## 2022-07-14 NOTE — Telephone Encounter (Signed)
Please advise 

## 2022-07-14 NOTE — Telephone Encounter (Signed)
Recommend to check her temperature when she feels hot and keep a log. If symptoms continue come to the office next week, may need further workup (repeat urine test, chest x-ray etc.)

## 2022-07-15 ENCOUNTER — Other Ambulatory Visit: Payer: Self-pay

## 2022-07-15 MED ORDER — AMLODIPINE BESYLATE 10 MG PO TABS: ORAL_TABLET | ORAL | 1 refills | Status: DC

## 2022-08-04 DIAGNOSIS — E119 Type 2 diabetes mellitus without complications: Secondary | ICD-10-CM | POA: Diagnosis not present

## 2022-08-04 LAB — HM DIABETES EYE EXAM

## 2022-08-09 DIAGNOSIS — M48061 Spinal stenosis, lumbar region without neurogenic claudication: Secondary | ICD-10-CM | POA: Diagnosis not present

## 2022-08-09 DIAGNOSIS — G894 Chronic pain syndrome: Secondary | ICD-10-CM | POA: Diagnosis not present

## 2022-08-09 DIAGNOSIS — Z79899 Other long term (current) drug therapy: Secondary | ICD-10-CM | POA: Diagnosis not present

## 2022-08-09 DIAGNOSIS — Z79891 Long term (current) use of opiate analgesic: Secondary | ICD-10-CM | POA: Diagnosis not present

## 2022-08-09 DIAGNOSIS — Z5181 Encounter for therapeutic drug level monitoring: Secondary | ICD-10-CM | POA: Diagnosis not present

## 2022-08-09 DIAGNOSIS — M5136 Other intervertebral disc degeneration, lumbar region: Secondary | ICD-10-CM | POA: Diagnosis not present

## 2022-09-24 ENCOUNTER — Other Ambulatory Visit: Payer: Self-pay | Admitting: Internal Medicine

## 2022-10-17 DIAGNOSIS — N390 Urinary tract infection, site not specified: Secondary | ICD-10-CM | POA: Diagnosis not present

## 2022-10-17 DIAGNOSIS — E119 Type 2 diabetes mellitus without complications: Secondary | ICD-10-CM | POA: Diagnosis not present

## 2022-10-17 DIAGNOSIS — I10 Essential (primary) hypertension: Secondary | ICD-10-CM | POA: Diagnosis not present

## 2022-10-17 DIAGNOSIS — R300 Dysuria: Secondary | ICD-10-CM | POA: Diagnosis not present

## 2022-10-17 DIAGNOSIS — A499 Bacterial infection, unspecified: Secondary | ICD-10-CM | POA: Diagnosis not present

## 2022-10-27 ENCOUNTER — Ambulatory Visit: Payer: Medicare PPO | Admitting: Internal Medicine

## 2022-10-27 ENCOUNTER — Encounter: Payer: Self-pay | Admitting: Internal Medicine

## 2022-10-27 VITALS — BP 138/74 | HR 102 | Temp 97.7°F | Resp 18 | Ht 66.0 in | Wt 177.0 lb

## 2022-10-27 DIAGNOSIS — E1140 Type 2 diabetes mellitus with diabetic neuropathy, unspecified: Secondary | ICD-10-CM

## 2022-10-27 DIAGNOSIS — I10 Essential (primary) hypertension: Secondary | ICD-10-CM | POA: Diagnosis not present

## 2022-10-27 DIAGNOSIS — E785 Hyperlipidemia, unspecified: Secondary | ICD-10-CM

## 2022-10-27 DIAGNOSIS — Z7984 Long term (current) use of oral hypoglycemic drugs: Secondary | ICD-10-CM

## 2022-10-27 DIAGNOSIS — R399 Unspecified symptoms and signs involving the genitourinary system: Secondary | ICD-10-CM

## 2022-10-27 LAB — URINALYSIS, ROUTINE W REFLEX MICROSCOPIC
Bilirubin Urine: NEGATIVE
Hgb urine dipstick: NEGATIVE
Ketones, ur: NEGATIVE
Leukocytes,Ua: NEGATIVE
Nitrite: NEGATIVE
RBC / HPF: NONE SEEN (ref 0–?)
Specific Gravity, Urine: 1.02 (ref 1.000–1.030)
Total Protein, Urine: NEGATIVE
Urine Glucose: NEGATIVE
Urobilinogen, UA: 0.2 (ref 0.0–1.0)
pH: 6 (ref 5.0–8.0)

## 2022-10-27 LAB — CBC WITH DIFFERENTIAL/PLATELET
Basophils Absolute: 0.1 10*3/uL (ref 0.0–0.1)
Basophils Relative: 0.8 % (ref 0.0–3.0)
Eosinophils Absolute: 0.4 10*3/uL (ref 0.0–0.7)
Eosinophils Relative: 3.3 % (ref 0.0–5.0)
HCT: 36.5 % (ref 36.0–46.0)
Hemoglobin: 11.9 g/dL — ABNORMAL LOW (ref 12.0–15.0)
Lymphocytes Relative: 22.4 % (ref 12.0–46.0)
Lymphs Abs: 2.5 10*3/uL (ref 0.7–4.0)
MCHC: 32.7 g/dL (ref 30.0–36.0)
MCV: 87.9 fl (ref 78.0–100.0)
Monocytes Absolute: 1.2 10*3/uL — ABNORMAL HIGH (ref 0.1–1.0)
Monocytes Relative: 10.8 % (ref 3.0–12.0)
Neutro Abs: 7 10*3/uL (ref 1.4–7.7)
Neutrophils Relative %: 62.7 % (ref 43.0–77.0)
Platelets: 469 10*3/uL — ABNORMAL HIGH (ref 150.0–400.0)
RBC: 4.15 Mil/uL (ref 3.87–5.11)
RDW: 13.6 % (ref 11.5–15.5)
WBC: 11.2 10*3/uL — ABNORMAL HIGH (ref 4.0–10.5)

## 2022-10-27 LAB — BASIC METABOLIC PANEL
BUN: 20 mg/dL (ref 6–23)
CO2: 26 mEq/L (ref 19–32)
Calcium: 10.3 mg/dL (ref 8.4–10.5)
Chloride: 95 mEq/L — ABNORMAL LOW (ref 96–112)
Creatinine, Ser: 0.75 mg/dL (ref 0.40–1.20)
GFR: 71.48 mL/min (ref >60.00)
Glucose, Bld: 86 mg/dL (ref 70–99)
Potassium: 4.9 mEq/L (ref 3.5–5.1)
Sodium: 131 mEq/L — ABNORMAL LOW (ref 135–145)

## 2022-10-27 LAB — AST: AST: 18 U/L (ref 0–37)

## 2022-10-27 LAB — HEMOGLOBIN A1C: Hgb A1c MFr Bld: 6.4 % (ref 4.6–6.5)

## 2022-10-27 LAB — ALT: ALT: 12 U/L (ref 0–35)

## 2022-10-27 NOTE — Patient Instructions (Addendum)
Come back to the office immediately fever or chills.  Your urinary infection symptoms return.  You have nausea vomiting or stomach pain.  GO TO THE LAB : Get the blood work     GO TO THE FRONT DESK, PLEASE SCHEDULE YOUR APPOINTMENTS Come back for a physical exam in 3 months  Vaccines I recommend: Shingrix (shingles) RSV vaccine Flu shot every fall

## 2022-10-27 NOTE — Progress Notes (Signed)
Subjective:    Patient ID: Natalie [name], female    DOB: 1933-04-09, 89 y.o.   MRN: [id number]  DOS:  10/27/2022 Type of visit - description: Routine follow-up  Here for management of chronic medical problems. Reports ambulatory CBGs are typically 109, 117. 2 weeks ago went to urgent care with urinary frequency, was prescribed ciprofloxacin, symptoms are somewhat better but not completely gone.  Currently with no fever or chills. No nausea vomiting. No dysuria or gross hematuria. MSK: Having more left knee and low back pain than usual.  Review of Systems See above   Past Medical History:  Diagnosis Date   Back pain, chronic    BCC (basal cell carcinoma of skin)    several, nose , 2012 L forehead   Degeneration of lumbar intervertebral disc    Dr. Ethelene Hal   Diabetes mellitus    HTN (hypertension)    Hyperlipidemia    Hyperparathyroidism    Lumbago    Lumbar epidural steroid injection at L5-S1 midline by Dr. Ethelene Hal   Pelvic pain in female 2005    back and pelvic pain, s/p w-u, CT (-), u/s showed a thick endometrium, s/p Bx per gyn   Ureteric obstruction    hydronephrosis,sees urology    Past Surgical History:  Procedure Laterality Date   CYSTECTOMY  1989   thyriod   LOBECTOMY  1989   s/p  RML lung lobectomy, biopsy showed precancerous changes    POLYPECTOMY     colon    Current Outpatient Medications  Medication Instructions   ACCU-CHEK GUIDE test strip USE TO CHECK BLOOD GLUCOSE ONCE DAILY.   acetaminophen (TYLENOL) 1,250 mg, Every 6 hours PRN   albuterol (PROVENTIL HFA;VENTOLIN HFA) 108 (90 BASE) MCG/ACT inhaler 2 puffs, Every 6 hours PRN   amLODipine (NORVASC) 10 MG tablet TAKE 1 TABLET(10 MG) BY MOUTH DAILY   azelastine (ASTELIN) 0.1 % nasal spray 2 sprays, Each Nare, 2 times daily   BIOTIN PO Take by mouth.   blood glucose meter kit and supplies Dispense based on patient and insurance preference. Check blood sugars once daily   calcium carbonate (TUMS -  DOSED IN MG ELEMENTAL CALCIUM) 500 MG chewable tablet 1 tablet, Daily   CRANBERRY PO Oral   D-Mannose 500 MG CAPS Oral   EPIPEN 2-PAK 0.3 MG/0.3ML SOAJ injection Reported on 11/27/2015   fluticasone (FLONASE) 50 MCG/ACT nasal spray 2 sprays, Each Nare, Daily   fluticasone (FLOVENT HFA) 110 MCG/ACT inhaler 1-2 puffs, 2 times daily   furosemide (LASIX) 20 MG tablet TAKE 1 TABLET(20 MG) BY MOUTH DAILY   HYDROcodone-acetaminophen (NORCO) 10-325 MG per tablet 1 tablet, Oral, Every 8 hours PRN   lisinopril (ZESTRIL) 20 MG tablet TAKE 1 TABLET(20 MG) BY MOUTH DAILY   loratadine (CLARITIN) 10 mg, Oral, Daily,     metFORMIN (GLUCOPHAGE) 500 MG tablet TAKE 1 TABLET(500 MG) BY MOUTH TWICE DAILY WITH A MEAL   Multiple Vitamins-Minerals (MULTIVITAMIN,TX-MINERALS) tablet 1 tablet, Oral, Daily,     naproxen sodium (ALEVE) 110 mg, Oral, Daily   OneTouch Delica Lancets 33G MISC Check blood sugar once daily   Simethicone (MAALOX ANTI-GAS PO) As needed       Objective:   Physical Exam BP 138/74   Pulse (!) 102   Temp 97.7 F (36.5 C) (Oral)   Resp 18   Ht 5\' 6"  (1.676 m)   Wt 177 lb (80.3 kg)   SpO2 97%   BMI 28.57 kg/m  General:   Well  developed, NAD, BMI noted.  HEENT:  Normocephalic . Face symmetric, atraumatic Lungs:  CTA B Normal respiratory effort, no intercostal retractions, no accessory muscle use. Heart: RRR,  no murmur.  Heart rate when she arrived 102, I rechecked manually: 90. Abdomen:  Not distended, soft, minimally tender at the suprapubic area without mass or rebound. Skin: Not pale. Not jaundice Lower extremities: no pretibial edema bilaterally  Neurologic:  alert & oriented X3.  Speech normal, gait appropriate for age and unassisted Psych--  Cognition and judgment appear intact.  Cooperative with normal attention span and concentration.  Behavior appropriate. No anxious or depressed appearing.     Assessment     Assessment DM, complicated by neuropathy and  callus HTN Hyperlipidemia, intolerant to medications , not checking labs Allergies, B-spasm, Dr Towner Callas  Fatigue, chronic GU:  Hydronephrosis  dx ~ 2007, follow-up with serial USs. DX was right UPJ obstruction. Had a Lasix renogram 2013 ( R 46%, L 54%) Dr. Margarita Grizzle, rx to RTC PRN.  [place] 11-2016 : no obstruction MSK: --DJD --Chronic back pain, dr Ethelene Hal , he rx hydrocodone BCC H/o pelvic pain, 2005, w/u per gyn H/o hyperparathyroidism H/o RML Lobectomy, 1989, bx precancerous cells ?  Heart murmur: Echocardiogram 04/2017 essentially normal  PLAN Recent UTI: Treated with Cipro 10/17/2022 at [facility] better, still slightly tender at the lower abdomen.  Recommend recheck a UA, urine culture, and call if symptoms increase.  See AVS. DM: On metformin, ambulatory CBGs in the low 100s.  Check A1c -LFTs. HTN: BP today is very good, continue amlodipine, Lasix (she only takes half tablet daily), lisinopril.  Check a BMP and CBC. DJD, chronic back pain: Having more pain than usual at the left knee and back, to see Ortho soon.  Pain control with Tylenol, hydrocodone, also take NSAIDs, strongly recommend moderation, try not to take them daily. RTC 3 months CPX

## 2022-10-28 LAB — URINE CULTURE
MICRO NUMBER:: 15073315
Result:: NO GROWTH
SPECIMEN QUALITY:: ADEQUATE

## 2022-10-28 NOTE — Assessment & Plan Note (Addendum)
Recent UTI: Treated with Cipro 10/17/2022 at [facility] better, still slightly tender at the lower abdomen.  Recommend recheck a UA, urine culture, and call if symptoms increase.  See AVS. DM: On metformin, ambulatory CBGs in the low 100s.  Check A1c -LFTs. HTN: BP today is very good, continue amlodipine, Lasix (she only takes half tablet daily), lisinopril.  Check a BMP and CBC. DJD, chronic back pain: Having more pain than usual at the left knee and back, to see Ortho soon.  Pain control with Tylenol, hydrocodone, also take NSAIDs, strongly recommend moderation, try not to take them daily. RTC 3 months CPX

## 2022-11-03 DIAGNOSIS — M1711 Unilateral primary osteoarthritis, right knee: Secondary | ICD-10-CM | POA: Diagnosis not present

## 2022-11-03 DIAGNOSIS — M1712 Unilateral primary osteoarthritis, left knee: Secondary | ICD-10-CM | POA: Diagnosis not present

## 2022-11-03 DIAGNOSIS — M170 Bilateral primary osteoarthritis of knee: Secondary | ICD-10-CM | POA: Diagnosis not present

## 2022-11-19 ENCOUNTER — Ambulatory Visit (INDEPENDENT_AMBULATORY_CARE_PROVIDER_SITE_OTHER): Payer: Medicare PPO | Admitting: *Deleted

## 2022-11-19 DIAGNOSIS — Z0000 Encounter for general adult medical examination without abnormal findings: Secondary | ICD-10-CM | POA: Diagnosis not present

## 2022-11-19 NOTE — Progress Notes (Signed)
Subjective:   Natalie [name] is a 89 y.o. female who presents for Medicare Annual (Subsequent) preventive examination.  Visit Complete: Virtual  I connected with  Natalie [name] on 11/19/22 by a audio enabled telemedicine application and verified that I am speaking with the correct person using two identifiers.  Patient Location: Home  Provider Location: Office/Clinic  I discussed the limitations of evaluation and management by telemedicine. The patient expressed understanding and agreed to proceed.   Review of Systems     Cardiac Risk Factors include: advanced age (>84men, >43 women);diabetes mellitus;hypertension;dyslipidemia     Objective:    Today's Vitals   11/19/22 1300  PainSc: 6    There is no height or weight on file to calculate BMI.     11/19/2022    1:09 PM 11/10/2021    2:07 PM 07/29/2020    2:53 PM 02/09/2018    2:50 PM  Advanced Directives  Does Patient Have a Medical Advance Directive? Yes Yes Yes No  Type of Advance Directive Living will;Healthcare Power of Attorney Living will    Does patient want to make changes to medical advance directive?   No - Patient declined   Copy of Healthcare Power of Attorney in Chart? No - copy requested       Current Medications (verified) Outpatient Encounter Medications as of 11/19/2022  Medication Sig   ACCU-CHEK GUIDE test strip USE TO CHECK BLOOD GLUCOSE ONCE DAILY.   acetaminophen (TYLENOL) 500 MG tablet Take 1,250 mg by mouth every 6 (six) hours as needed for mild pain.   albuterol (PROVENTIL HFA;VENTOLIN HFA) 108 (90 BASE) MCG/ACT inhaler Inhale 2 puffs into the lungs every 6 (six) hours as needed for wheezing. (Patient not taking: Reported on 08/04/2021)   amLODipine (NORVASC) 10 MG tablet TAKE 1 TABLET(10 MG) BY MOUTH DAILY   azelastine (ASTELIN) 0.1 % nasal spray Place 2 sprays into both nostrils 2 (two) times daily.   BIOTIN PO Take by mouth.   blood glucose meter kit and supplies Dispense based on patient  and insurance preference. Check blood sugars once daily   calcium carbonate (TUMS - DOSED IN MG ELEMENTAL CALCIUM) 500 MG chewable tablet Chew 1 tablet by mouth daily.   CRANBERRY PO Take by mouth.   D-Mannose 500 MG CAPS Take by mouth.   EPIPEN 2-PAK 0.3 MG/0.3ML SOAJ injection Reported on 11/27/2015 (Patient not taking: Reported on 11/10/2021)   fluticasone (FLONASE) 50 MCG/ACT nasal spray Place 2 sprays into both nostrils daily.   fluticasone (FLOVENT HFA) 110 MCG/ACT inhaler Inhale 1-2 puffs into the lungs 2 (two) times daily. (Patient not taking: Reported on 10/27/2022)   furosemide (LASIX) 20 MG tablet TAKE 1 TABLET(20 MG) BY MOUTH DAILY   HYDROcodone-acetaminophen (NORCO) 10-325 MG per tablet Take 1 tablet by mouth every 8 (eight) hours as needed.   lisinopril (ZESTRIL) 20 MG tablet TAKE 1 TABLET(20 MG) BY MOUTH DAILY   loratadine (CLARITIN) 10 MG tablet Take 10 mg by mouth daily.   metFORMIN (GLUCOPHAGE) 500 MG tablet TAKE 1 TABLET(500 MG) BY MOUTH TWICE DAILY WITH A MEAL   Multiple Vitamins-Minerals (MULTIVITAMIN,TX-MINERALS) tablet Take 1 tablet by mouth daily.   naproxen sodium (ANAPROX) 220 MG tablet Take 110 mg by mouth daily.   OneTouch Delica Lancets 33G MISC Check blood sugar once daily   Simethicone (MAALOX ANTI-GAS PO) Take by mouth as needed. Any anti-gas medication[tablet as needed].   No facility-administered encounter medications on file as of 11/19/2022.  Allergies (verified) Cefixime, Cefprozil, Cefuroxime axetil, Nitrofurantoin, Penicillins, and Sulfonamide derivatives   History: Past Medical History:  Diagnosis Date   Back pain, chronic    BCC (basal cell carcinoma of skin)    several, nose , 2012 L forehead   Degeneration of lumbar intervertebral disc    Dr. Ethelene Hal   Diabetes mellitus    HTN (hypertension)    Hyperlipidemia    Hyperparathyroidism    Lumbago    Lumbar epidural steroid injection at L5-S1 midline by Dr. Ethelene Hal   Pelvic pain in female 2005     back and pelvic pain, s/p w-u, CT (-), u/s showed a thick endometrium, s/p Bx per gyn   Ureteric obstruction    hydronephrosis,sees urology   Past Surgical History:  Procedure Laterality Date   CYSTECTOMY  1989   thyriod   LOBECTOMY  1989   s/p  RML lung lobectomy, biopsy showed precancerous changes    POLYPECTOMY     colon   Family History  Problem Relation Age of Onset   Diabetes Brother        X 2   Hypertension Brother    Hypertension Mother    Brain cancer Mother        tumor   CAD Father        MI at 68   Breast cancer Neg Hx    Colon cancer Neg Hx    Social History   Socioeconomic History   Marital status: Married    Spouse name: Not on file   Number of children: 4   Years of education: Not on file   Highest education level: Not on file  Occupational History   Occupation: retired Leisure centre manager: [employer]  Tobacco Use   Smoking status: Never   Smokeless tobacco: Never  Vaping Use   Vaping Use: Never used  Substance and Sexual Activity   Alcohol use: Yes    Comment: Rarely   Drug use: No   Sexual activity: Not Currently  Other Topics Concern   Not on file  Social History Narrative   Lives w/ husband (he is unable to do much)   2 of her 4 children in [place]            Social Determinants of Health   Financial Resource Strain: Low Risk  (11/19/2022)   Overall Financial Resource Strain (CARDIA)    Difficulty of Paying Living Expenses: Not hard at all  Food Insecurity: No Food Insecurity (11/19/2022)   Hunger Vital Sign    Worried About Running Out of Food in the Last Year: Never true    Ran Out of Food in the Last Year: Never true  Transportation Needs: No Transportation Needs (11/19/2022)   PRAPARE - Administrator, Civil Service (Medical): No    Lack of Transportation (Non-Medical): No  Physical Activity: Inactive (11/19/2022)   Exercise Vital Sign    Days of Exercise per Week: 0 days    Minutes of Exercise per  Session: 0 min  Stress: No Stress Concern Present (11/19/2022)   [employer] of Occupational Health - Occupational Stress Questionnaire    Feeling of Stress : Not at all  Social Connections: Moderately Isolated (11/19/2022)   Social Connection and Isolation Panel [NHANES]    Frequency of Communication with Friends and Family: More than three times a week    Frequency of Social Gatherings with Friends and Family: Three times a week  Attends Religious Services: Never    Active Member of Clubs or Organizations: No    Attends Banker Meetings: Never    Marital Status: Married    Tobacco Counseling Counseling given: Not Answered   Clinical Intake:  Pre-visit preparation completed: Yes  Pain : 0-10 Pain Score: 6  Pain Type: Chronic pain Pain Location: Knee Pain Orientation: Left Pain Descriptors / Indicators: Aching, Constant Pain Onset: More than a month ago Pain Frequency: Constant     Nutritional Risks: None Diabetes: Yes CBG done?: No Did pt. bring in CBG monitor from home?: No  How often do you need to have someone help you when you read instructions, pamphlets, or other written materials from your doctor or pharmacy?: 1 - Never  Interpreter Needed?: No  Information entered by :: Donne Anon, CMA   Activities of Daily Living    11/19/2022    1:01 PM  In your present state of health, do you have any difficulty performing the following activities:  Hearing? 1  Comment hearing loss in left ear  Vision? 0  Difficulty concentrating or making decisions? 1  Comment memory  Walking or climbing stairs? 1  Dressing or bathing? 0  Doing errands, shopping? 1  Comment children drive her for errands  Preparing Food and eating ? N  Using the Toilet? N  In the past six months, have you accidently leaked urine? Y  Do you have problems with loss of bowel control? N  Managing your Medications? N  Managing your Finances? N  Housekeeping or managing your  Housekeeping? N    Patient Care Team: Wanda Plump, MD as PCP - Floy Sabina, MD as Consulting Physician (Physical Medicine and Rehabilitation) Melvenia Beam, MD as Consulting Physician (Otolaryngology) Jethro Bolus, MD as Consulting Physician (Ophthalmology) Sidney Ace, MD as Referring Physician (Allergy)  Indicate any recent Medical Services you may have received from other than [facility] providers in the past year (date may be approximate).     Assessment:   This is a routine wellness examination for [facility].  Hearing/Vision screen No results found.  Dietary issues and exercise activities discussed:     Goals Addressed   None    Depression Screen    11/19/2022    1:09 PM 10/27/2022   11:26 AM 05/31/2022    2:39 PM 12/01/2021   12:54 PM 11/10/2021    2:07 PM 08/04/2021   11:20 AM 02/26/2021   11:06 AM  PHQ 2/9 Scores  PHQ - 2 Score 0 0 0 0 0 0 0    Fall Risk    11/19/2022    1:05 PM 10/27/2022   11:26 AM 05/31/2022    2:39 PM 12/01/2021   12:54 PM 11/10/2021    2:10 PM  Fall Risk   Falls in the past year? 1 1 0 1 0  Number falls in past yr: 0 0 0 1 0  Injury with Fall? 0 0 0 0 0  Risk for fall due to : Impaired balance/gait    Impaired vision;Impaired balance/gait  Follow up Falls evaluation completed Falls evaluation completed Falls evaluation completed Falls evaluation completed Falls prevention discussed    MEDICARE RISK AT HOME:  Medicare Risk at Home - 11/19/22 1303     Any stairs in or around the home? Yes    If so, are there any without handrails? No    Home free of loose throw rugs in walkways, pet beds, electrical cords, etc? Yes  Adequate lighting in your home to reduce risk of falls? Yes    Life alert? No    Use of a cane, walker or w/c? Yes    Grab bars in the bathroom? No   in the shower   Shower chair or bench in shower? Yes    Elevated toilet seat or a handicapped toilet? Yes             TIMED UP AND GO:  Was the test  performed?  No    Cognitive Function:        11/19/2022    1:10 PM 11/10/2021    2:13 PM  6CIT Screen  What Year? 0 points 0 points  What month? 0 points 0 points  What time? 0 points 0 points  Count back from 20 0 points 0 points  Months in reverse 2 points 0 points  Repeat phrase 0 points 4 points  Total Score 2 points 4 points    Immunizations Immunization History  Administered Date(s) Administered   COVID-19, mRNA, vaccine(Comirnaty)12 years and older 04/09/2022   Fluad Quad(high Dose 65+) 04/28/2022   Influenza Split 02/15/2012, 02/05/2014, 02/15/2020   Influenza Whole 02/02/2010   Influenza, High Dose Seasonal PF 02/28/2018, 02/27/2019   Influenza,inj,Quad PF,6+ Mos 02/27/2013   Influenza-Unspecified 04/12/2015, 03/17/2016, 03/19/2017, 02/10/2021   PFIZER(Purple Top)SARS-COV-2 Vaccination 06/24/2019, 07/19/2019, 02/14/2020, 09/17/2020, 02/10/2021   PNEUMOCOCCAL CONJUGATE-20 12/01/2021   Pfizer Covid-19 Vaccine Bivalent Booster 69yrs & up 02/26/2021   Pneumococcal Conjugate-13 03/19/2014   Pneumococcal Polysaccharide-23 04/26/2005, 06/08/2011   Td 11/20/1997, 05/01/2008   Tdap 02/09/2018   Zoster, Live 02/02/2010    TDAP status: Up to date  Flu Vaccine status: Up to date  Pneumococcal vaccine status: Up to date  Covid-19 vaccine status: Completed vaccines  Qualifies for Shingles Vaccine? Yes   Zostavax completed Yes   Shingrix Completed?: No.    Education has been provided regarding the importance of this vaccine. Patient has been advised to call insurance company to determine out of pocket expense if they have not yet received this vaccine. Advised may also receive vaccine at local pharmacy or Health Dept. Verbalized acceptance and understanding.  Screening Tests Health Maintenance  Topic Date Due   Zoster Vaccines- Shingrix (1 of 2) Never done   FOOT EXAM  08/05/2022   INFLUENZA VACCINE  12/16/2022   HEMOGLOBIN A1C  04/28/2023   OPHTHALMOLOGY EXAM   08/04/2023   Medicare Annual Wellness (AWV)  11/19/2023   DTaP/Tdap/Td (4 - Td or Tdap) 02/10/2028   Pneumonia Vaccine 82+ Years old  Completed   DEXA SCAN  Completed   COVID-19 Vaccine  Completed   HPV VACCINES  Aged Out    Health Maintenance  Health Maintenance Due  Topic Date Due   Zoster Vaccines- Shingrix (1 of 2) Never done   FOOT EXAM  08/05/2022    Colorectal cancer screening: No longer required.   Mammogram status: No longer required due to age.  Bone Density status: declined.  Lung Cancer Screening: (Low Dose CT Chest recommended if Age 14-80 years, 20 pack-year currently smoking OR have quit w/in 15years.) does not qualify.   Additional Screening:  Hepatitis C Screening: does not qualify  Vision Screening: Recommended annual ophthalmology exams for early detection of glaucoma and other disorders of the eye. Is the patient up to date with their annual eye exam?  Yes  Who is the provider or what is the name of the office in which the patient attends annual eye exams?  [facility] If pt is not established with a provider, would they like to be referred to a provider to establish care? No .   Dental Screening: Recommended annual dental exams for proper oral hygiene  Diabetic Foot Exam: Diabetic Foot Exam: Overdue, Pt has been advised about the importance in completing this exam. Pt is scheduled for diabetic foot exam on N/a.  Community Resource Referral / Chronic Care Management: CRR required this visit?  No   CCM required this visit?  No     Plan:     I have personally reviewed and noted the following in the patient's chart:   Medical and social history Use of alcohol, tobacco or illicit drugs  Current medications and supplements including opioid prescriptions. Patient is currently taking opioid prescriptions. Information provided to patient regarding non-opioid alternatives. Patient advised to discuss non-opioid treatment plan with their  provider. Functional ability and status Nutritional status Physical activity Advanced directives List of other physicians Hospitalizations, surgeries, and ER visits in previous 12 months Vitals Screenings to include cognitive, depression, and falls Referrals and appointments  In addition, I have reviewed and discussed with patient certain preventive protocols, quality metrics, and best practice recommendations. A written personalized care plan for preventive services as well as general preventive health recommendations were provided to patient.     Donne Anon, CMA   11/19/2022   After Visit Summary: (Declined) Due to this being a telephonic visit, with patients personalized plan was offered to patient but patient Declined AVS at this time   Nurse Notes: None

## 2022-11-19 NOTE — Patient Instructions (Signed)
Natalie [name] , Thank you for taking time to come for your Medicare Wellness Visit. I appreciate your ongoing commitment to your health goals. Please review the following plan we discussed and let me know if I can assist you in the future.     This is a list of the screening recommended for you and due dates:  Health Maintenance  Topic Date Due   Zoster (Shingles) Vaccine (1 of 2) Never done   Complete foot exam   08/05/2022   Flu Shot  12/16/2022   Hemoglobin A1C  04/28/2023   Eye exam for diabetics  08/04/2023   Medicare Annual Wellness Visit  11/19/2023   DTaP/Tdap/Td vaccine (4 - Td or Tdap) 02/10/2028   Pneumonia Vaccine  Completed   DEXA scan (bone density measurement)  Completed   COVID-19 Vaccine  Completed   HPV Vaccine  Aged Out    Next appointment: Follow up in one year for your annual wellness visit.   Preventive Care 87 Years and Older, Female Preventive care refers to lifestyle choices and visits with your health care provider that can promote health and wellness. What does preventive care include? A yearly physical exam. This is also called an annual well check. Dental exams once or twice a year. Routine eye exams. Ask your health care provider how often you should have your eyes checked. Personal lifestyle choices, including: Daily care of your teeth and gums. Regular physical activity. Eating a healthy diet. Avoiding tobacco and drug use. Limiting alcohol use. Practicing safe sex. Taking low-dose aspirin every day. Taking vitamin and mineral supplements as recommended by your health care provider. What happens during an annual well check? The services and screenings done by your health care provider during your annual well check will depend on your age, overall health, lifestyle risk factors, and family history of disease. Counseling  Your health care provider may ask you questions about your: Alcohol use. Tobacco use. Drug use. Emotional  well-being. Home and relationship well-being. Sexual activity. Eating habits. History of falls. Memory and ability to understand (cognition). Work and work Astronomer. Reproductive health. Screening  You may have the following tests or measurements: Height, weight, and BMI. Blood pressure. Lipid and cholesterol levels. These may be checked every 5 years, or more frequently if you are over 87 years old. Skin check. Lung cancer screening. You may have this screening every year starting at age 87 if you have a 30-pack-year history of smoking and currently smoke or have quit within the past 15 years. Fecal occult blood test (FOBT) of the stool. You may have this test every year starting at age 87. Flexible sigmoidoscopy or colonoscopy. You may have a sigmoidoscopy every 5 years or a colonoscopy every 10 years starting at age 87. Hepatitis C blood test. Hepatitis B blood test. Sexually transmitted disease (STD) testing. Diabetes screening. This is done by checking your blood sugar (glucose) after you have not eaten for a while (fasting). You may have this done every 1-3 years. Bone density scan. This is done to screen for osteoporosis. You may have this done starting at age 87. Mammogram. This may be done every 1-2 years. Talk to your health care provider about how often you should have regular mammograms. Talk with your health care provider about your test results, treatment options, and if necessary, the need for more tests. Vaccines  Your health care provider may recommend certain vaccines, such as: Influenza vaccine. This is recommended every year. Tetanus, diphtheria, and acellular pertussis (  Tdap, Td) vaccine. You may need a Td booster every 10 years. Zoster vaccine. You may need this after age 87. Pneumococcal 13-valent conjugate (PCV13) vaccine. One dose is recommended after age 87. Pneumococcal polysaccharide (PPSV23) vaccine. One dose is recommended after age 87. Talk to your  health care provider about which screenings and vaccines you need and how often you need them. This information is not intended to replace advice given to you by your health care provider. Make sure you discuss any questions you have with your health care provider. Document Released: 05/30/2015 Document Revised: 01/21/2016 Document Reviewed: 03/04/2015 Elsevier Interactive Patient Education  2017 [employer].  Fall Prevention in the Home Falls can cause injuries. They can happen to people of all ages. There are many things you can do to make your home safe and to help prevent falls. What can I do on the outside of my home? Regularly fix the edges of walkways and driveways and fix any cracks. Remove anything that might make you trip as you walk through a door, such as a raised step or threshold. Trim any bushes or trees on the path to your home. Use bright outdoor lighting. Clear any walking paths of anything that might make someone trip, such as rocks or tools. Regularly check to see if handrails are loose or broken. Make sure that both sides of any steps have handrails. Any raised decks and porches should have guardrails on the edges. Have any leaves, snow, or ice cleared regularly. Use sand or salt on walking paths during winter. Clean up any spills in your garage right away. This includes oil or grease spills. What can I do in the bathroom? Use night lights. Install grab bars by the toilet and in the tub and shower. Do not use towel bars as grab bars. Use non-skid mats or decals in the tub or shower. If you need to sit down in the shower, use a plastic, non-slip stool. Keep the floor dry. Clean up any water that spills on the floor as soon as it happens. Remove soap buildup in the tub or shower regularly. Attach bath mats securely with double-sided non-slip rug tape. Do not have throw rugs and other things on the floor that can make you trip. What can I do in the bedroom? Use night  lights. Make sure that you have a light by your bed that is easy to reach. Do not use any sheets or blankets that are too big for your bed. They should not hang down onto the floor. Have a firm chair that has side arms. You can use this for support while you get dressed. Do not have throw rugs and other things on the floor that can make you trip. What can I do in the kitchen? Clean up any spills right away. Avoid walking on wet floors. Keep items that you use a lot in easy-to-reach places. If you need to reach something above you, use a strong step stool that has a grab bar. Keep electrical cords out of the way. Do not use floor polish or wax that makes floors slippery. If you must use wax, use non-skid floor wax. Do not have throw rugs and other things on the floor that can make you trip. What can I do with my stairs? Do not leave any items on the stairs. Make sure that there are handrails on both sides of the stairs and use them. Fix handrails that are broken or loose. Make sure that handrails are  as long as the stairways. Check any carpeting to make sure that it is firmly attached to the stairs. Fix any carpet that is loose or worn. Avoid having throw rugs at the top or bottom of the stairs. If you do have throw rugs, attach them to the floor with carpet tape. Make sure that you have a light switch at the top of the stairs and the bottom of the stairs. If you do not have them, ask someone to add them for you. What else can I do to help prevent falls? Wear shoes that: Do not have high heels. Have rubber bottoms. Are comfortable and fit you well. Are closed at the toe. Do not wear sandals. If you use a stepladder: Make sure that it is fully opened. Do not climb a closed stepladder. Make sure that both sides of the stepladder are locked into place. Ask someone to hold it for you, if possible. Clearly mark and make sure that you can see: Any grab bars or handrails. First and last  steps. Where the edge of each step is. Use tools that help you move around (mobility aids) if they are needed. These include: Canes. Walkers. Scooters. Crutches. Turn on the lights when you go into a dark area. Replace any light bulbs as soon as they burn out. Set up your furniture so you have a clear path. Avoid moving your furniture around. If any of your floors are uneven, fix them. If there are any pets around you, be aware of where they are. Review your medicines with your doctor. Some medicines can make you feel dizzy. This can increase your chance of falling. Ask your doctor what other things that you can do to help prevent falls. This information is not intended to replace advice given to you by your health care provider. Make sure you discuss any questions you have with your health care provider. Document Released: 02/27/2009 Document Revised: 10/09/2015 Document Reviewed: 06/07/2014 Elsevier Interactive Patient Education  2017 [employer].

## 2022-11-28 ENCOUNTER — Other Ambulatory Visit: Payer: Self-pay | Admitting: Internal Medicine

## 2022-12-03 ENCOUNTER — Ambulatory Visit (INDEPENDENT_AMBULATORY_CARE_PROVIDER_SITE_OTHER): Payer: Medicare PPO | Admitting: Podiatry

## 2022-12-03 DIAGNOSIS — E1140 Type 2 diabetes mellitus with diabetic neuropathy, unspecified: Secondary | ICD-10-CM

## 2022-12-03 DIAGNOSIS — L03115 Cellulitis of right lower limb: Secondary | ICD-10-CM

## 2022-12-03 DIAGNOSIS — L97512 Non-pressure chronic ulcer of other part of right foot with fat layer exposed: Secondary | ICD-10-CM | POA: Diagnosis not present

## 2022-12-03 MED ORDER — MUPIROCIN 2 % EX OINT: 1.0000 | TOPICAL_OINTMENT | Freq: Every day | CUTANEOUS | 0 refills | Status: DC

## 2022-12-03 MED ORDER — DOXYCYCLINE HYCLATE 100 MG PO TABS: 100.0000 mg | ORAL_TABLET | Freq: Two times a day (BID) | ORAL | 0 refills | Status: AC

## 2022-12-03 NOTE — Progress Notes (Unsigned)
Subjective:  Patient ID: Natalie [name], female    DOB: [birth year],  MRN: [id number]  Chief Complaint  Patient presents with   Wound Check    Scab on right hallux. Open area. Patient is diabetic.     89 y.o. female presents for concern for right hallux ulceration.  She also notices some black discoloration at the tip of the toe.  She is not sure how this started.  She does have a history of type 2 diabetes with neuropathy.  Past Medical History:  Diagnosis Date   Back pain, chronic    BCC (basal cell carcinoma of skin)    several, nose , 2012 L forehead   Degeneration of lumbar intervertebral disc    Dr. Ethelene Hal   Diabetes mellitus    HTN (hypertension)    Hyperlipidemia    Hyperparathyroidism    Lumbago    Lumbar epidural steroid injection at L5-S1 midline by Dr. Ethelene Hal   Pelvic pain in female 2005    back and pelvic pain, s/p w-u, CT (-), u/s showed a thick endometrium, s/p Bx per gyn   Ureteric obstruction    hydronephrosis,sees urology    Allergies  Allergen Reactions   Cefixime     unknown   Cefprozil Nausea And Vomiting   Cefuroxime Axetil Nausea And Vomiting   Nitrofurantoin     unknown   Penicillins     unknown   Sulfonamide Derivatives     unknown    ROS: Negative except as per HPI above  Objective:  General: AAO x3, NAD  Dermatological: Ulceration of the distal aspect of the right hallux with eschar present at the central aspect of the wound.  Underlying there is fibrotic tissue that probes down to subcutaneous fat tissue but no bone was exposed.  There is healthy bleeding of the ulceration postdebridement.  Mild ingrown nail at the medial border of the right hallux which was debrided back. erythema noted of the right hallux mild edema  Vascular:  Dorsalis Pedis artery and Posterior Tibial artery pedal pulses are 2/4 bilateral.  Capillary fill time < 3 sec to all digits.   Neruologic: Grossly absent to touch to the digit level  Musculoskeletal: No  gross boney pedal deformities bilateral. No pain, crepitus, or limitation noted with foot and ankle range of motion bilateral. Muscular strength 5/5 in all groups tested bilateral.  Gait: Unassisted, Nonantalgic.     Radiographs:  Deferred Assessment:   1. 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 Cellulitis of right foot   3. Controlled type 2 diabetes mellitus with diabetic neuropathy, unspecified whether long term insulin use (HCC)      Plan:  Patient was evaluated and treated and all questions answered.  Ulcer aspect of right hallux -We discussed the etiology and factors that are a part of the wound healing process.  We also discussed the risk of infection both soft tissue and osteomyelitis from open ulceration.  Discussed the risk of limb loss if this happens or worsens. -Debridement as below. -Dressed with Betadine, DSD. -Continue home dressing changes daily with mupirocin ointment and dry gauze dressing -Continue off-loading with postop shoe which was dispensed. -Vascular testing deferred patient has palpable pedal pulses -Last antibiotics: eRx for doxycycline 100 mg twice daily for the next 10 days for mild cellulitis of the right hallux -Imaging: Deferred.  Procedure: Excisional Debridement of Wound Rationale: Removal of non-viable soft tissue from the wound to promote healing.  Anesthesia: none Post-Debridement Wound Measurements: 1.3 cm x 0.8 cm x 0.2 cm  Type of Debridement: Sharp Excisional Tissue Removed: Non-viable soft tissue Depth of Debridement: subcutaneous tissue. Technique: Sharp excisional debridement to bleeding, viable wound base.  Dressing: Dry, sterile, compression dressing. Disposition: Patient tolerated procedure well.   Return in about 3 weeks (around 12/24/2022) for f/u R hallux ulcer.       Return in about 3 weeks (around 12/24/2022) for f/u R hallux ulcer.          Corinna Gab, DPM [facility] /  [facility]

## 2022-12-16 ENCOUNTER — Ambulatory Visit: Payer: Medicare PPO | Admitting: Podiatry

## 2022-12-17 ENCOUNTER — Ambulatory Visit: Payer: Medicare PPO | Admitting: Podiatry

## 2022-12-17 ENCOUNTER — Encounter: Payer: Self-pay | Admitting: Podiatry

## 2022-12-17 DIAGNOSIS — E1140 Type 2 diabetes mellitus with diabetic neuropathy, unspecified: Secondary | ICD-10-CM

## 2022-12-17 DIAGNOSIS — L97512 Non-pressure chronic ulcer of other part of right foot with fat layer exposed: Secondary | ICD-10-CM

## 2022-12-17 DIAGNOSIS — L03115 Cellulitis of right lower limb: Secondary | ICD-10-CM

## 2022-12-17 NOTE — Progress Notes (Signed)
Subjective:  Patient ID: Natalie [name], female    DOB: 15-Oct-1932,  MRN: [id number]  Chief Complaint  Patient presents with   Wound Check    "It was doing okay, I saw a little black spot on it yesterday.  My leg has been swelling and my foot.  There's been a little discharge."    89 y.o. female presents for follow-up for right hallux ulceration.  She says that the wound seems to be doing okay.  She has noted some swelling in the right leg.  Past Medical History:  Diagnosis Date   Back pain, chronic    BCC (basal cell carcinoma of skin)    several, nose , 2012 L forehead   Degeneration of lumbar intervertebral disc    Dr. Ethelene Hal   Diabetes mellitus    HTN (hypertension)    Hyperlipidemia    Hyperparathyroidism    Lumbago    Lumbar epidural steroid injection at L5-S1 midline by Dr. Ethelene Hal   Pelvic pain in female 2005    back and pelvic pain, s/p w-u, CT (-), u/s showed a thick endometrium, s/p Bx per gyn   Ureteric obstruction    hydronephrosis,sees urology    Allergies  Allergen Reactions   Cefixime     unknown   Cefprozil Nausea And Vomiting   Cefuroxime Axetil Nausea And Vomiting   Nitrofurantoin     unknown   Penicillins     unknown   Sulfonamide Derivatives     unknown    ROS: Negative except as per HPI above  Objective:  General: AAO x3, NAD  Dermatological: Ulceration of the distal aspect of the right hallux which is now much decreased in size.  No erythema surrounding.  No eschar present.  There is healthy bleeding of the ulceration postdebridement.  The wound measures approximately 0.2 x 0.2 x 0.2 cm postdebridement    Vascular:  Dorsalis Pedis artery and Posterior Tibial artery pedal pulses are 2/4 bilateral.  Capillary fill time < 3 sec to all digits.   Neruologic: Grossly absent to touch to the digit level  Musculoskeletal: No gross boney pedal deformities bilateral. No pain, crepitus, or limitation noted with foot and ankle range of motion  bilateral. Muscular strength 5/5 in all groups tested bilateral.  Gait: Unassisted, Nonantalgic.     Radiographs:  Deferred Assessment:   1. 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 Cellulitis of right foot   3. Controlled type 2 diabetes mellitus with diabetic neuropathy, unspecified whether long term insulin use (HCC)       Plan:  Patient was evaluated and treated and all questions answered.  Ulcer aspect of right hallux -Wound is much improved after wound care and daily dressing changes as well as debridement since last visit. -We discussed the etiology and factors that are a part of the wound healing process.  We also discussed the risk of infection both soft tissue and osteomyelitis from open ulceration.  Discussed the risk of limb loss if this happens or worsens. -Debridement as below. -Dressed with Betadine, DSD. -Continue home dressing changes daily with mupirocin ointment and dry gauze dressing -Continue off-loading with postop shoe which was dispensed. -Vascular testing deferred patient has palpable pedal pulses -Last antibiotics: No further antibiotics indicated -Imaging: Deferred.  Procedure: Excisional Debridement of Wound Rationale: Removal of non-viable soft tissue from the wound to promote healing.  Anesthesia: none Post-Debridement Wound Measurements: 0.2 x 0.2 cm x 0.2 cm  Type of Debridement: Sharp Excisional Tissue Removed: Non-viable soft tissue Depth of Debridement: subcutaneous tissue. Technique: Sharp excisional debridement to bleeding, viable wound base.  Dressing: Dry, sterile, compression dressing. Disposition: Patient tolerated procedure well.      Return in about 2 weeks (around 12/31/2022) for f/u R hallux ulcer.          Corinna Gab, DPM [facility] / [facility]

## 2022-12-28 DIAGNOSIS — G894 Chronic pain syndrome: Secondary | ICD-10-CM | POA: Diagnosis not present

## 2022-12-28 DIAGNOSIS — M48061 Spinal stenosis, lumbar region without neurogenic claudication: Secondary | ICD-10-CM | POA: Diagnosis not present

## 2022-12-28 DIAGNOSIS — M5136 Other intervertebral disc degeneration, lumbar region: Secondary | ICD-10-CM | POA: Diagnosis not present

## 2022-12-28 DIAGNOSIS — Z79891 Long term (current) use of opiate analgesic: Secondary | ICD-10-CM | POA: Diagnosis not present

## 2022-12-31 ENCOUNTER — Ambulatory Visit: Payer: Medicare PPO | Admitting: Podiatry

## 2022-12-31 DIAGNOSIS — M79661 Pain in right lower leg: Secondary | ICD-10-CM

## 2022-12-31 DIAGNOSIS — E1140 Type 2 diabetes mellitus with diabetic neuropathy, unspecified: Secondary | ICD-10-CM | POA: Diagnosis not present

## 2022-12-31 DIAGNOSIS — L97512 Non-pressure chronic ulcer of other part of right foot with fat layer exposed: Secondary | ICD-10-CM | POA: Diagnosis not present

## 2022-12-31 LAB — HM DIABETES FOOT EXAM

## 2022-12-31 NOTE — Progress Notes (Unsigned)
Subjective:  Patient ID: Natalie [name], female    DOB: 03-Feb-1933,  MRN: [id number]  Chief Complaint  Patient presents with   Wound Check    Right Hallux, no Drainage in the last couple days, DENIES N,V,F,C,SOB, is having swelling, no reddness    89 y.o. female presents for follow-up for right hallux ulceration.  She says that the wound seems to be well without drainage.  She has noted some swelling in the right leg.  Also having some right calf pain.  Past Medical History:  Diagnosis Date   Back pain, chronic    BCC (basal cell carcinoma of skin)    several, nose , 2012 L forehead   Degeneration of lumbar intervertebral disc    Dr. Ethelene Hal   Diabetes mellitus    HTN (hypertension)    Hyperlipidemia    Hyperparathyroidism    Lumbago    Lumbar epidural steroid injection at L5-S1 midline by Dr. Ethelene Hal   Pelvic pain in female 2005    back and pelvic pain, s/p w-u, CT (-), u/s showed a thick endometrium, s/p Bx per gyn   Ureteric obstruction    hydronephrosis,sees urology    Allergies  Allergen Reactions   Cefixime     unknown   Cefprozil Nausea And Vomiting   Cefuroxime Axetil Nausea And Vomiting   Nitrofurantoin     unknown   Penicillins     unknown   Sulfonamide Derivatives     unknown    ROS: Negative except as per HPI above  Objective:  General: AAO x3, NAD  Dermatological: Ulceration of the distal aspect of the right hallux which is now healed with eschar present.  No erythema or edema of the right hallux    Vascular:  Dorsalis Pedis artery and Posterior Tibial artery pedal pulses are 2/4 bilateral.  Capillary fill time < 3 sec to all digits.   Neruologic: Grossly absent to touch to the digit level  Musculoskeletal: Edema noted in the right lower extremity.  Pain with side-to-side squeeze of the right calf.  Gait: Unassisted, Nonantalgic.     Radiographs:  Deferred Assessment:   1. Chronic ulcer of right great toe with fat layer exposed (HCC)    2. Controlled type 2 diabetes mellitus with diabetic neuropathy, unspecified whether long term insulin use (HCC)   3. Right calf pain        Plan:  Patient was evaluated and treated and all questions answered.  Ulcer at the distal aspect of right hallux -healed -Wound is now fully healed to the right hallux distal aspect.  Only little eschar is present. -We discussed the etiology and factors that are a part of the wound healing process.  We also discussed the risk of infection both soft tissue and osteomyelitis from open ulceration.  Discussed the risk of limb loss if this happens or worsens. -Debridement not indicated as wound healed -Dressed with Band-Aid for protection and apply gel toe cap to cushion the area -Wear the postop shoe for another week or 2 and then okay to get out of that back to regular shoe gear -No further dressing changes needed at home just monitor the area and apply Band-Aid and gel toe cap as needed  # Right calf pain and swelling -Order placed for ultrasound DVT rule out study given concern for possible DVT with calf edema and pain on compression -If positive for DVT patient will be referred to vascular clinic for management of this issue   Return  in about 9 weeks (around 03/04/2023) for [facility].          Corinna Gab, DPM [facility] / [facility]

## 2023-01-04 ENCOUNTER — Ambulatory Visit (HOSPITAL_COMMUNITY)
Admission: RE | Admit: 2023-01-04 | Discharge: 2023-01-04 | Disposition: A | Discharge status: Home / Self Care | Payer: Medicare PPO | Source: Ambulatory Visit | Attending: Podiatry | Admitting: Podiatry

## 2023-01-04 DIAGNOSIS — M79661 Pain in right lower leg: Secondary | ICD-10-CM | POA: Diagnosis not present

## 2023-01-26 ENCOUNTER — Encounter: Payer: Self-pay | Admitting: Internal Medicine

## 2023-02-01 ENCOUNTER — Encounter: Payer: Medicare PPO | Admitting: Internal Medicine

## 2023-02-02 ENCOUNTER — Other Ambulatory Visit: Payer: Self-pay | Admitting: Internal Medicine

## 2023-02-03 ENCOUNTER — Ambulatory Visit: Payer: Medicare PPO | Admitting: Podiatry

## 2023-02-10 ENCOUNTER — Ambulatory Visit: Payer: Medicare PPO | Admitting: Podiatry

## 2023-02-21 ENCOUNTER — Ambulatory Visit (INDEPENDENT_AMBULATORY_CARE_PROVIDER_SITE_OTHER): Payer: Medicare PPO | Admitting: Internal Medicine

## 2023-02-21 ENCOUNTER — Encounter: Payer: Medicare PPO | Admitting: Internal Medicine

## 2023-02-21 ENCOUNTER — Encounter: Payer: Self-pay | Admitting: Internal Medicine

## 2023-02-21 VITALS — BP 136/84 | HR 101 | Temp 98.4°F | Resp 18 | Ht 66.0 in | Wt 173.2 lb

## 2023-02-21 DIAGNOSIS — R399 Unspecified symptoms and signs involving the genitourinary system: Secondary | ICD-10-CM | POA: Diagnosis not present

## 2023-02-21 DIAGNOSIS — R609 Edema, unspecified: Secondary | ICD-10-CM

## 2023-02-21 DIAGNOSIS — E1140 Type 2 diabetes mellitus with diabetic neuropathy, unspecified: Secondary | ICD-10-CM | POA: Diagnosis not present

## 2023-02-21 DIAGNOSIS — Z0000 Encounter for general adult medical examination without abnormal findings: Secondary | ICD-10-CM

## 2023-02-21 DIAGNOSIS — D649 Anemia, unspecified: Secondary | ICD-10-CM

## 2023-02-21 DIAGNOSIS — Z0001 Encounter for general adult medical examination with abnormal findings: Secondary | ICD-10-CM

## 2023-02-21 DIAGNOSIS — Z7984 Long term (current) use of oral hypoglycemic drugs: Secondary | ICD-10-CM

## 2023-02-21 DIAGNOSIS — R011 Cardiac murmur, unspecified: Secondary | ICD-10-CM

## 2023-02-21 DIAGNOSIS — E785 Hyperlipidemia, unspecified: Secondary | ICD-10-CM

## 2023-02-21 DIAGNOSIS — I10 Essential (primary) hypertension: Secondary | ICD-10-CM

## 2023-02-21 DIAGNOSIS — Z23 Encounter for immunization: Secondary | ICD-10-CM | POA: Diagnosis not present

## 2023-02-21 NOTE — Progress Notes (Signed)
Subjective:    Patient ID: Natalie [name], female    DOB: 09/24/1932, 89 y.o.   MRN: [id number]  DOS:  02/21/2023 Type of visit - description: CPX  Here for CPX Chronic medical problems addressed. Again reports some discomfort with urination without fever chills or gross hematuria. For the last 6 weeks has noted lower extremity edema, with no difficulty breathing or chest pain. Has not gained weight.  Wt Readings from Last 3 Encounters:  02/21/23 173 lb 4 oz (78.6 kg)  10/27/22 177 lb (80.3 kg)  07/09/22 175 lb (79.4 kg)    Review of Systems  Other than above, a 14 point review of systems is negative     Past Medical History:  Diagnosis Date   Back pain, chronic    BCC (basal cell carcinoma of skin)    several, nose , 2012 L forehead   Degeneration of lumbar intervertebral disc    Dr. Ethelene Hal   Diabetes mellitus    HTN (hypertension)    Hyperlipidemia    Hyperparathyroidism    Lumbago    Lumbar epidural steroid injection at L5-S1 midline by Dr. Ethelene Hal   Pelvic pain in female 2005    back and pelvic pain, s/p w-u, CT (-), u/s showed a thick endometrium, s/p Bx per gyn   Ureteric obstruction    hydronephrosis,sees urology    Past Surgical History:  Procedure Laterality Date   CYSTECTOMY  1989   thyriod   LOBECTOMY  1989   s/p  RML lung lobectomy, biopsy showed precancerous changes    POLYPECTOMY     colon   Social History   Socioeconomic History   Marital status: Married    Spouse name: Not on file   Number of children: 4   Years of education: Not on file   Highest education level: Not on file  Occupational History   Occupation: retired Leisure centre manager: [employer]  Tobacco Use   Smoking status: Never   Smokeless tobacco: Never  Vaping Use   Vaping status: Never Used  Substance and Sexual Activity   Alcohol use: Not Currently    Comment: Rarely   Drug use: No   Sexual activity: Not Currently  Other Topics Concern    Not on file  Social History Narrative   Lives w/ husband (he is unable to do much)   2 of her 4 children in [place]            Social Determinants of Health   Financial Resource Strain: Low Risk  (11/19/2022)   Overall Financial Resource Strain (CARDIA)    Difficulty of Paying Living Expenses: Not hard at all  Food Insecurity: No Food Insecurity (11/19/2022)   Hunger Vital Sign    Worried About Running Out of Food in the Last Year: Never true    Ran Out of Food in the Last Year: Never true  Transportation Needs: No Transportation Needs (11/19/2022)   PRAPARE - Administrator, Civil Service (Medical): No    Lack of Transportation (Non-Medical): No  Physical Activity: Inactive (11/19/2022)   Exercise Vital Sign    Days of Exercise per Week: 0 days    Minutes of Exercise per Session: 0 min  Stress: No Stress Concern Present (11/19/2022)   [employer] of Occupational Health - Occupational Stress Questionnaire    Feeling of Stress : Not at all  Social Connections: Moderately Isolated (11/19/2022)   Social Connection and Isolation Panel [  NHANES]    Frequency of Communication with Friends and Family: More than three times a week    Frequency of Social Gatherings with Friends and Family: Three times a week    Attends Religious Services: Never    Active Member of Clubs or Organizations: No    Attends Banker Meetings: Never    Marital Status: Married  Catering manager Violence: Not At Risk (11/19/2022)   Humiliation, Afraid, Rape, and Kick questionnaire    Fear of Current or Ex-Partner: No    Emotionally Abused: No    Physically Abused: No    Sexually Abused: No    Current Outpatient Medications  Medication Instructions   ACCU-CHEK GUIDE test strip USE TO CHECK BLOOD GLUCOSE ONCE DAILY.   acetaminophen (TYLENOL) 1,250 mg, Every 6 hours PRN   albuterol (PROVENTIL HFA;VENTOLIN HFA) 108 (90 BASE) MCG/ACT inhaler 2 puffs, Every 6 hours PRN   amLODipine (NORVASC) 10 mg,  Oral, Daily   azelastine (ASTELIN) 0.1 % nasal spray 2 sprays, Each Nare, 2 times daily   BIOTIN PO Take by mouth.   blood glucose meter kit and supplies Dispense based on patient and insurance preference. Check blood sugars once daily   calcium carbonate (TUMS - DOSED IN MG ELEMENTAL CALCIUM) 500 MG chewable tablet 1 tablet, Daily   CRANBERRY PO Oral   D-Mannose 500 MG CAPS Oral   EPIPEN 2-PAK 0.3 MG/0.3ML SOAJ injection Reported on 11/27/2015   fluticasone (FLONASE) 50 MCG/ACT nasal spray 2 sprays, Each Nare, Daily   fluticasone (FLOVENT HFA) 110 MCG/ACT inhaler 1-2 puffs, 2 times daily   furosemide (LASIX) 20 MG tablet TAKE 1 TABLET(20 MG) BY MOUTH DAILY   HYDROcodone-acetaminophen (NORCO) 10-325 MG per tablet 1 tablet, Oral, Every 8 hours PRN   Lido-Menthol-Methyl Sal-Camph (CBD KINGS EX) Apply externally, (Roll on salve)   lisinopril (ZESTRIL) 20 mg, Oral, Daily   loratadine (CLARITIN) 10 mg, Oral, Daily,     metFORMIN (GLUCOPHAGE) 500 mg, Oral, 2 times daily with meals   Multiple Vitamins-Minerals (MULTIVITAMIN,TX-MINERALS) tablet 1 tablet, Oral, Daily,     mupirocin ointment (BACTROBAN) 2 % 1 Application, Topical, Daily   naproxen sodium (ALEVE) 110 mg, Oral, Daily   OneTouch Delica Lancets 33G MISC Check blood sugar once daily   Simethicone (MAALOX ANTI-GAS PO) As needed       Objective:   Physical Exam BP 136/84   Pulse (!) 101   Temp 98.4 F (36.9 C) (Oral)   Resp 18   Ht 5\' 6"  (1.676 m)   Wt 173 lb 4 oz (78.6 kg)   SpO2 96%   BMI 27.96 kg/m  General:   Well developed, NAD, BMI noted.  HEENT:  Normocephalic . Face symmetric, atraumatic Lungs:  CTA B Normal respiratory effort, no intercostal retractions, no accessory muscle use. Heart: RRR, + systolic murmur Abdomen:  Not distended, soft, non-tender. No rebound or rigidity.   Skin: Not pale. Not jaundice Lower extremities: ++/+++  pretibial edema bilaterally  Neurologic:  alert & oriented X3.  Speech normal,  gait: Uses a walker Psych--  Cognition and judgment appear intact.  Cooperative with normal attention span and concentration.  Behavior appropriate. No anxious or depressed appearing.     Assessment     Assessment DM, complicated by neuropathy and callus HTN Hyperlipidemia, intolerant to medications , not checking labs Allergies, B-spasm, Dr [place] Callas  Fatigue, chronic GU:  Hydronephrosis  dx ~ 2007, follow-up with serial USs. DX was right UPJ obstruction. Had a  Lasix renogram 2013 ( R 46%, L 54%) Dr. Margarita Grizzle, rx to RTC PRN.  [place] 11-2016 : no obstruction MSK: --DJD --Chronic back pain, dr Ethelene Hal , he rx hydrocodone BCC H/o pelvic pain, 2005, w/u per gyn H/o hyperparathyroidism H/o RML Lobectomy, 1989, bx precancerous cells Heart murmur: Echocardiogram 04/2017 essentially normal  PLAN Here for CPX - Td 2019 - PNM 23 : 2006 , 2013;   prevnar 2013; PNM 20: 2023  - zostavax 2011 .  - flu shot today -Recommend: RSV, Shingrix, COVID booster. -No further screening for colon,  cervical or breast cancer.  See previous entries. DEXA 11-2006 , 12-2008, 02-2013 , 2018 -----> normal.   - U/S 05-2011 neg for AAA -Labs: See orders Edema: The patient has chronic edema, worsening lately, her podiatrist ordered a [place]: (-) for DVT 01/04/2023. No weight gain noted. EKG today: Sinus rhythm, heart rate 86.  No acute changes. Plan:  TSH, echo.   Consider stop amlodipine.  Reassess next month Heart murmur: long h/o murmur ,  echo 2018 essentially normal.  Rechecking echo due to worsening edema. UTI?  Patient again mentions discomfort with urination.  Declined at exam of the perineum to rule out a skin condition.  Check a UA urine culture Memory issues?  Concerned about her memory, recommend to RTC in 1 month for assessment of her memory noting her memory seems sharp  today. DM: On metformin, check A1c. Anemia: Per last CBC, checking a CBC and iron panel. RTC 1 month.

## 2023-02-21 NOTE — Assessment & Plan Note (Signed)
Here for CPX - Td 2019 - PNM 23 : 2006 , 2013;   prevnar 2013; PNM 20: 2023  - zostavax 2011 .  - flu shot today -Recommend: RSV, Shingrix, COVID booster. -No further screening for colon,  cervical or breast cancer.  See previous entries. DEXA 11-2006 , 12-2008, 02-2013 , 2018 -----> normal.   - U/S 05-2011 neg for AAA -Labs: See orders

## 2023-02-21 NOTE — Patient Instructions (Addendum)
Vaccines I recommend: Shingrix (shingles) Covid booster- new this fall RSV vaccine   Check the  blood pressure regularly Blood pressure goal:  between 110/65 and  135/85. If it is consistently higher or lower, let me know     GO TO THE LAB : Get the blood work    Once you get your blood work back, we will consider decrease or stop amlodipine   Next visit with me next month.   Please schedule it at the front desk

## 2023-02-21 NOTE — Assessment & Plan Note (Signed)
Here for CPX Edema: The patient has chronic edema, worsening lately, her podiatrist ordered a [place]: (-) for DVT 01/04/2023. No weight gain noted. EKG today: Sinus rhythm, heart rate 86.  No acute changes. Plan:  TSH, echo.   Consider stop amlodipine.  Reassess next month Heart murmur: long h/o murmur ,  echo 2018 essentially normal.  Rechecking echo due to worsening edema. UTI?  Patient again mentions discomfort with urination.  Declined at exam of the perineum to rule out a skin condition.  Check a UA urine culture Memory issues?  Concerned about her memory, recommend to RTC in 1 month for assessment of her memory noting her memory seems sharp  today. DM: On metformin, check A1c. Anemia: Per last CBC, checking a CBC and iron panel. RTC 1 month.

## 2023-02-22 LAB — URINALYSIS, ROUTINE W REFLEX MICROSCOPIC
Bilirubin Urine: NEGATIVE
Hgb urine dipstick: NEGATIVE
Ketones, ur: NEGATIVE
Leukocytes,Ua: NEGATIVE
Nitrite: POSITIVE — AB
Specific Gravity, Urine: 1.01 (ref 1.000–1.030)
Total Protein, Urine: NEGATIVE
Urine Glucose: NEGATIVE
Urobilinogen, UA: 0.2 (ref 0.0–1.0)
pH: 6.5 (ref 5.0–8.0)

## 2023-02-22 LAB — BASIC METABOLIC PANEL
BUN: 14 mg/dL (ref 6–23)
CO2: 25 meq/L (ref 19–32)
Calcium: 10.6 mg/dL — ABNORMAL HIGH (ref 8.4–10.5)
Chloride: 102 meq/L (ref 96–112)
Creatinine, Ser: 0.71 mg/dL (ref 0.40–1.20)
GFR: 76.17 mL/min (ref >60.00)
Glucose, Bld: 88 mg/dL (ref 70–99)
Potassium: 4.6 meq/L (ref 3.5–5.1)
Sodium: 138 meq/L (ref 135–145)

## 2023-02-22 LAB — CBC WITH DIFFERENTIAL/PLATELET
Basophils Absolute: 0.1 10*3/uL (ref 0.0–0.1)
Basophils Relative: 1.1 % (ref 0.0–3.0)
Eosinophils Absolute: 0.2 10*3/uL (ref 0.0–0.7)
Eosinophils Relative: 2.5 % (ref 0.0–5.0)
HCT: 40.4 % (ref 36.0–46.0)
Hemoglobin: 13 g/dL (ref 12.0–15.0)
Lymphocytes Relative: 34.1 % (ref 12.0–46.0)
Lymphs Abs: 2.6 10*3/uL (ref 0.7–4.0)
MCHC: 32.2 g/dL (ref 30.0–36.0)
MCV: 88.5 fL (ref 78.0–100.0)
Monocytes Absolute: 0.8 10*3/uL (ref 0.1–1.0)
Monocytes Relative: 9.9 % (ref 3.0–12.0)
Neutro Abs: 4 10*3/uL (ref 1.4–7.7)
Neutrophils Relative %: 52.4 % (ref 43.0–77.0)
Platelets: 330 10*3/uL (ref 150.0–400.0)
RBC: 4.56 Mil/uL (ref 3.87–5.11)
RDW: 14.8 % (ref 11.5–15.5)
WBC: 7.7 10*3/uL (ref 4.0–10.5)

## 2023-02-22 LAB — TSH: TSH: 1.58 u[IU]/mL (ref 0.35–5.50)

## 2023-02-22 LAB — IBC + FERRITIN
Ferritin: 23.2 ng/mL (ref 10.0–291.0)
Iron: 56 ug/dL (ref 42–145)
Saturation Ratios: 16.2 % — ABNORMAL LOW (ref 20.0–50.0)
TIBC: 345.8 ug/dL (ref 250.0–450.0)
Transferrin: 247 mg/dL (ref 212.0–360.0)

## 2023-02-22 LAB — HEMOGLOBIN A1C: Hgb A1c MFr Bld: 6.1 % (ref 4.6–6.5)

## 2023-02-23 LAB — URINE CULTURE
MICRO NUMBER:: 15560873
SPECIMEN QUALITY:: ADEQUATE

## 2023-02-24 ENCOUNTER — Ambulatory Visit: Payer: Medicare PPO | Admitting: Podiatry

## 2023-02-24 DIAGNOSIS — L97512 Non-pressure chronic ulcer of other part of right foot with fat layer exposed: Secondary | ICD-10-CM

## 2023-02-24 DIAGNOSIS — E1140 Type 2 diabetes mellitus with diabetic neuropathy, unspecified: Secondary | ICD-10-CM

## 2023-02-24 MED ORDER — MUPIROCIN 2 % EX OINT: 1.0000 | TOPICAL_OINTMENT | Freq: Every day | CUTANEOUS | 0 refills | Status: DC

## 2023-02-24 MED ORDER — CIPROFLOXACIN HCL 500 MG PO TABS: 500.0000 mg | ORAL_TABLET | Freq: Two times a day (BID) | ORAL | 0 refills | Status: DC

## 2023-02-24 NOTE — Addendum Note (Signed)
Addended by: Conrad Grayson D on: 02/24/2023 11:31 AM   Modules accepted: Orders

## 2023-02-24 NOTE — Progress Notes (Signed)
Subjective:  Patient ID: Natalie [name], female    DOB: [birth year],  MRN: [id number]  Chief Complaint  Patient presents with   Foot Ulcer    Right great toe    89 y.o. female presents for follow-up for right hallux ulceration.   Noticed that there has been a scab present at the site of the prior ulceration on the right great toe.  Has seen some redness around the area recently.  Past Medical History:  Diagnosis Date   Back pain, chronic    BCC (basal cell carcinoma of skin)    several, nose , 2012 L forehead   Degeneration of lumbar intervertebral disc    Dr. Ethelene Hal   Diabetes mellitus    HTN (hypertension)    Hyperlipidemia    Hyperparathyroidism    Lumbago    Lumbar epidural steroid injection at L5-S1 midline by Dr. Ethelene Hal   Pelvic pain in female 2005    back and pelvic pain, s/p w-u, CT (-), u/s showed a thick endometrium, s/p Bx per gyn   Ureteric obstruction    hydronephrosis,sees urology    Allergies  Allergen Reactions   Cefixime     unknown   Cefprozil Nausea And Vomiting   Cefuroxime Axetil Nausea And Vomiting   Nitrofurantoin     unknown   Penicillins     unknown   Sulfonamide Derivatives     unknown    ROS: Negative except as per HPI above  Objective:  General: AAO x3, NAD  Dermatological: Ulceration of the distal aspect of the right hallux with eschar overlying.  Upon debridement of the overlying eschar there is a superficial circular ulceration to this level subcutaneous fat tissue.  Very mild surrounding erythema.  Wound does not probe to bone.  Ulcer measures 0.9 x 0.8 x 0.2 cm postdebridement    Vascular:  Dorsalis Pedis artery and Posterior Tibial artery pedal pulses are 2/4 bilateral.  Capillary fill time < 3 sec to all digits.   Neruologic: Grossly absent to touch to the digit level  Musculoskeletal: Decreased edema to the right lower extremity improved from prior  Gait: Unassisted, Nonantalgic.     Radiographs:   Deferred Assessment:   1. 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 Controlled type 2 diabetes mellitus with diabetic neuropathy, unspecified whether long term insulin use (HCC)         Plan:  Patient was evaluated and treated and all questions answered.  Ulcer at the distal aspect of right hallux -recurrent ulceration present without evidence of significant infection  -We discussed the etiology and factors that are a part of the wound healing process.  We also discussed the risk of infection both soft tissue and osteomyelitis from open ulceration.  Discussed the risk of limb loss if this happens or worsens. -Debridement as below -Dressed with antibiotic ointment and dry gauze and Coban -eRx for mupirocin 2% ointment apply once daily to the area and cover with gauze -Weightbearing as tolerated in postop shoe which patient already has -Defer antibiotic therapy at this time if there is worsening redness swelling or drainage of the toe have the patient call the office and we will provide her with oral antibiotics as needed   Procedure: Excisional Debridement of Wound Rationale: Removal of non-viable soft tissue from the wound to promote healing.  Anesthesia: none Post-Debridement Wound Measurements: 0.9 cm x 0.8 cm x 0.2 cm  Type of Debridement: Sharp Excisional Tissue Removed:  Non-viable soft tissue Depth of Debridement: subcutaneous tissue. Technique: Sharp excisional debridement to bleeding, viable wound base.  Dressing: Dry, sterile, compression dressing. Disposition: Patient tolerated procedure well.   # Right calf pain and swelling -Previously negative for DVT with ultrasound   Return in about 3 weeks (around 03/17/2023) for f.u R hallux ulcer.          Corinna Gab, DPM [facility] / [facility]

## 2023-03-02 ENCOUNTER — Telehealth: Payer: Self-pay | Admitting: Internal Medicine

## 2023-03-02 DIAGNOSIS — N390 Urinary tract infection, site not specified: Secondary | ICD-10-CM

## 2023-03-02 NOTE — Telephone Encounter (Signed)
If she continue with UTI symptoms despite taking antibiotics, recommend to see urology again.  If they ask for a referral please place it.  Dx recurrent UTIs.

## 2023-03-02 NOTE — Telephone Encounter (Signed)
Please advise. Labs were mailed on 02/24/23

## 2023-03-02 NOTE — Telephone Encounter (Signed)
Spoke w/ Pt- informed of recommendations. Pt verbalized understanding. Informed that I had mailed results to her on 02/24/23- she should hopefully be getting in the next several days.

## 2023-03-02 NOTE — Telephone Encounter (Signed)
Pt states her uti sxs are not better and does not know what she should do. Also wanted to know if labs have been mailed because she has not received them.

## 2023-03-02 NOTE — Addendum Note (Signed)
Addended byConrad Ironwood D on: 03/02/2023 01:25 PM   Modules accepted: Orders

## 2023-03-03 ENCOUNTER — Telehealth: Payer: Self-pay

## 2023-03-03 NOTE — Telephone Encounter (Signed)
Patient called about using band aids verses gauze to dress her toe. She tried the band aids, but when she took them off the skin looked white underneath. Advised the band aids may be keeping the area too moist. She will switch back to the gauze dressings she was doing before. Advised to please call back with any further questions. She will keep her appointment scheduled for 03/17/23

## 2023-03-11 ENCOUNTER — Other Ambulatory Visit (HOSPITAL_BASED_OUTPATIENT_CLINIC_OR_DEPARTMENT_OTHER): Payer: Self-pay

## 2023-03-11 MED ORDER — COVID-19 MRNA VAC-TRIS(PFIZER) 30 MCG/0.3ML IM SUSY
0.3000 mL | PREFILLED_SYRINGE | Freq: Once | INTRAMUSCULAR | 0 refills | Status: AC
  Filled 2023-03-11: qty 0.3, 1d supply, fill #0

## 2023-03-15 ENCOUNTER — Ambulatory Visit (HOSPITAL_COMMUNITY): Payer: Medicare PPO | Attending: Internal Medicine

## 2023-03-15 DIAGNOSIS — R609 Edema, unspecified: Secondary | ICD-10-CM | POA: Diagnosis not present

## 2023-03-15 DIAGNOSIS — R011 Cardiac murmur, unspecified: Secondary | ICD-10-CM | POA: Insufficient documentation

## 2023-03-15 LAB — ECHOCARDIOGRAM COMPLETE
AR max vel: 1.41 cm2
AV Area VTI: 1.28 cm2
AV Area mean vel: 1.27 cm2
AV Mean grad: 13.4 mm[Hg]
AV Peak grad: 20.8 mm[Hg]
Ao pk vel: 2.28 m/s
Area-P 1/2: 3.72 cm2
S' Lateral: 2.5 cm

## 2023-03-17 ENCOUNTER — Encounter: Payer: Self-pay | Admitting: Podiatry

## 2023-03-17 ENCOUNTER — Ambulatory Visit: Payer: Medicare PPO | Admitting: Podiatry

## 2023-03-17 DIAGNOSIS — L84 Corns and callosities: Secondary | ICD-10-CM

## 2023-03-17 DIAGNOSIS — M2041 Other hammer toe(s) (acquired), right foot: Secondary | ICD-10-CM | POA: Diagnosis not present

## 2023-03-17 DIAGNOSIS — E1151 Type 2 diabetes mellitus with diabetic peripheral angiopathy without gangrene: Secondary | ICD-10-CM | POA: Diagnosis not present

## 2023-03-17 DIAGNOSIS — E11621 Type 2 diabetes mellitus with foot ulcer: Secondary | ICD-10-CM

## 2023-03-17 DIAGNOSIS — L97509 Non-pressure chronic ulcer of other part of unspecified foot with unspecified severity: Secondary | ICD-10-CM

## 2023-03-17 NOTE — Progress Notes (Signed)
Chief Complaint  Patient presents with   Toe Pain    PATIENT STATES THAT SHE HAS A FOLLOW UP FOR A  Ulcer ON HER RIGHT HALLUX AND PATIENT STATES THERE HAS BEEN  A LITTLE DISCHARGE AND DRAINING , PATIENT STATES THAT SHE HASN'T HAD MUCH PAIN ON SIDE OF RF OF HALLUX , PATIENT IS NOT TAKING ANY MEDICATION FOR PAIN.     HPI: 89 y.o. female presenting today for follow-up of ulceration distal right hallux.  She has been wearing her surgical shoe.  She has been alternating between Band-Aid dressings and gauze dressings based on if it leaves the toe looking damp.  She did notice a small spot of drainage at the beginning of this week but has not had any drainage since then.  Caregiver is with her today.  She is using a walker for ambulation.  Past Medical History:  Diagnosis Date   Back pain, chronic    BCC (basal cell carcinoma of skin)    several, nose , 2012 L forehead   Degeneration of lumbar intervertebral disc    Dr. Ethelene Hal   Diabetes mellitus    HTN (hypertension)    Hyperlipidemia    Hyperparathyroidism    Lumbago    Lumbar epidural steroid injection at L5-S1 midline by Dr. Ethelene Hal   Pelvic pain in female 2005    back and pelvic pain, s/p w-u, CT (-), u/s showed a thick endometrium, s/p Bx per gyn   Ureteric obstruction    hydronephrosis,sees urology    Past Surgical History:  Procedure Laterality Date   CYSTECTOMY  1989   thyriod   LOBECTOMY  1989   s/p  RML lung lobectomy, biopsy showed precancerous changes    POLYPECTOMY     colon    Allergies  Allergen Reactions   Cefixime     unknown   Cefprozil Nausea And Vomiting   Cefuroxime Axetil Nausea And Vomiting   Nitrofurantoin     unknown   Penicillins     unknown   Sulfonamide Derivatives     unknown    PHYSICAL EXAM: General: The patient is alert and oriented x3 in no acute distress.  Dermatology: Skin is warm, dry and supple bilateral lower extremities. Interspaces are clear of maceration and debris.   There is a corn on the distal aspect of the right third toe.  This appears to be from the surgical shoe itself.  She seems to be sliding towards the front of the shoe when weightbearing in the surgical shoe.  This is not ulcerated.  Small area of friction on medial heel from surgical shoe    Wound 1 description:  Location: Distal right hallux    Depth: Closed, eschar formed    Wound Border: Clear    Wound Base: Not applicable Odor?:  None    Surrounding Tissue: No edema or erythema    Infected?:  None    Necrosis?:  None    Pain?:  None    Tunneling: Not applicable    Dimensions (cm): Not applicable  Vascular: Pedal pulses are diminished with +1 pitting edema to the right lower leg and ankle  Neurological: Light touch sensation diminished right forefoot  Musculoskeletal Exam: Lesser toe contractures at the PIP joints of the right foot.     Latest Ref Rng & Units 02/21/2023    2:22 PM 10/27/2022   11:53 AM 05/31/2022    3:15 PM  Hemoglobin A1C  Hemoglobin-A1c 4.6 -  6.5 % 6.1  6.4  6.5    ASSESSMENT / PLAN OF CARE: 1. 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 Hammertoe of right foot     FOR HOME USE ONLY DME DIABETIC SHOE  The previously ulcerated area was sharply debrided of hyperkeratotic and devitalized soft tissue with sterile #312 blade to the level of epidermis.  The small central eschar was left in place.  Hemostasis obtained.  Antibiotic ointment and DSD applied.  Reviewed off-loading with patient.  She will continue with her surgical shoe, however a Silipos toe cap was dispensed for the right third toe to protect this area from the tip of the shoe.  Moving her a size up on the surgical shoe may place her at a fall risk since it extend so much further beyond the tips of her toes.  A felt pad was placed inside her right surgical shoe to try and stop pressure/rubbing on the medial heel.  Patient is a candidate for the diabetic shoe program.  Will get her set up  with our pedorthist for a diabetic shoe consult.  Her current new balance shoes appear too small and her toes are at the end of the left shoe when looking at her toe impressions on the insole.  The patient will continue alternating with Band-Aids and gauze dressings to the toe daily.  Starting next week she can discontinue bandages but keep a close eye on the area while she returns to regular shoe gear.  Hopefully she can get in with our pedorthist sooner than later and get started on this shoe order for her.  Discussed risks / concerns regarding ulcer with patient and possible sequelae if left untreated.  Stressed importance of infection prevention at home. Short-term goals are: prevent infection, off-load ulcer, heal ulcer Long-term goals are:  prevent recurrence, prevent amputation.   F/u 4 weeks with Dr. Annamary Rummage for ulcer recheck   Arye Weyenberg DBurna Mortimer, DPM, FACFAS [facility]     [address] [place], [place] [address]                Office [phone]  Fax [phone]

## 2023-03-28 ENCOUNTER — Ambulatory Visit: Payer: Medicare PPO | Admitting: Internal Medicine

## 2023-03-28 ENCOUNTER — Encounter: Payer: Self-pay | Admitting: Internal Medicine

## 2023-03-28 VITALS — BP 138/76 | HR 99 | Temp 97.9°F | Resp 18 | Ht 66.0 in | Wt 169.2 lb

## 2023-03-28 DIAGNOSIS — G3184 Mild cognitive impairment, so stated: Secondary | ICD-10-CM | POA: Diagnosis not present

## 2023-03-28 NOTE — Progress Notes (Unsigned)
Subjective:    Patient ID: Natalie [name], female    DOB: [birth year], 89 y.o.   MRN: [id number]  DOS:  03/28/2023 Type of visit - description: Follow-up  At the last visit, she expressed concerns about being forgetful. Here for more detail Mini-Mental exam.  Review of Systems See above   Past Medical History:  Diagnosis Date   Back pain, chronic    BCC (basal cell carcinoma of skin)    several, nose , 2012 L forehead   Degeneration of lumbar intervertebral disc    Dr. Ethelene Hal   Diabetes mellitus    HTN (hypertension)    Hyperlipidemia    Hyperparathyroidism    Lumbago    Lumbar epidural steroid injection at L5-S1 midline by Dr. Ethelene Hal   Pelvic pain in female 2005    back and pelvic pain, s/p w-u, CT (-), u/s showed a thick endometrium, s/p Bx per gyn   Ureteric obstruction    hydronephrosis,sees urology    Past Surgical History:  Procedure Laterality Date   CYSTECTOMY  1989   thyriod   LOBECTOMY  1989   s/p  RML lung lobectomy, biopsy showed precancerous changes    POLYPECTOMY     colon    Current Outpatient Medications  Medication Instructions   ACCU-CHEK GUIDE test strip USE TO CHECK BLOOD GLUCOSE ONCE DAILY.   acetaminophen (TYLENOL) 1,250 mg, Every 6 hours PRN   albuterol (PROVENTIL HFA;VENTOLIN HFA) 108 (90 BASE) MCG/ACT inhaler 2 puffs, Inhalation, Every 6 hours PRN   amLODipine (NORVASC) 10 mg, Oral, Daily   azelastine (ASTELIN) 0.1 % nasal spray 2 sprays, Each Nare, 2 times daily   BIOTIN PO Take by mouth.   blood glucose meter kit and supplies Dispense based on patient and insurance preference. Check blood sugars once daily   calcium carbonate (TUMS - DOSED IN MG ELEMENTAL CALCIUM) 500 MG chewable tablet 1 tablet, Daily   ciprofloxacin (CIPRO) 500 mg, Oral, 2 times daily   CRANBERRY PO Oral   D-Mannose 500 MG CAPS Oral   EPIPEN 2-PAK 0.3 MG/0.3ML SOAJ injection Reported on 11/27/2015   fluticasone (FLONASE) 50 MCG/ACT nasal spray 2 sprays, Each Nare,  Daily   fluticasone (FLOVENT HFA) 110 MCG/ACT inhaler 1-2 puffs, Inhalation, 2 times daily   furosemide (LASIX) 20 MG tablet TAKE 1 TABLET(20 MG) BY MOUTH DAILY   HYDROcodone-acetaminophen (NORCO) 10-325 MG per tablet 1 tablet, Oral, Every 8 hours PRN   Lido-Menthol-Methyl Sal-Camph (CBD KINGS EX) Apply externally, (Roll on salve)   lisinopril (ZESTRIL) 20 mg, Oral, Daily   loratadine (CLARITIN) 10 mg, Oral, Daily,     metFORMIN (GLUCOPHAGE) 500 mg, Oral, 2 times daily with meals   Multiple Vitamins-Minerals (MULTIVITAMIN,TX-MINERALS) tablet 1 tablet, Oral, Daily,     mupirocin ointment (BACTROBAN) 2 % 1 Application, Topical, Daily   naproxen sodium (ALEVE) 110 mg, Oral, Daily   OneTouch Delica Lancets 33G MISC Check blood sugar once daily   Simethicone (MAALOX ANTI-GAS PO) As needed       Objective:   Physical Exam BP 138/76   Pulse 99   Temp 97.9 F (36.6 C) (Oral)   Resp 18   Ht 5\' 6"  (1.676 m)   Wt 169 lb 4 oz (76.8 kg)   SpO2 97%   BMI 27.32 kg/m  General:   Well developed, NAD, BMI noted. HEENT:  Normocephalic . Face symmetric, atraumatic Skin: Not pale. Not jaundice Neurologic:  alert & oriented X3.  MMSE: Score 26. Speech normal,  gait  assisted by a walker Psych--  Cognition and judgment appear intact.  Cooperative with normal attention span and concentration.  Behavior appropriate. No anxious or depressed appearing.      Assessment     Assessment DM, complicated by neuropathy and callus HTN Hyperlipidemia, intolerant to medications , not checking labs Allergies, B-spasm, Dr [place] Callas  Fatigue, chronic GU:  Hydronephrosis  dx ~ 2007, follow-up with serial USs. DX was right UPJ obstruction. Had a Lasix renogram 2013 ( R 46%, L 54%) Dr. Margarita Grizzle, rx to RTC PRN.  [place] 11-2016 : no obstruction MSK: --DJD --Chronic back pain, dr Ethelene Hal , he rx hydrocodone BCC H/o pelvic pain, 2005, w/u per gyn H/o hyperparathyroidism H/o RML Lobectomy, 1989, bx precancerous  cells Heart murmur: Echocardiogram 04/2017 essentially normal  PLAN MCI: The patient is forgetful, her MMSE score 26, mostly because she had a hard time spelling backwards World.  Overall, she is doing well, we agreed to do some specific blood work at the next opportunity (RPR,Homocystine level and B12.  Last TSH normal). We talk about possible medication, Aricept.  Advised that she will have to take for years, has potential side effects. At this point I am not sure that these are her best interest and she agreed.  Will revisit the issue periodically.  Encouraged to stay active mentally. UTI: Urine culture +, received ciprofloxacin, was recommended to see urology if symptoms continue. Next visit: 4 months

## 2023-03-28 NOTE — Patient Instructions (Addendum)
If you have urinary tract infection symptoms, please call the urologist. [phone]  vaccines I recommend: Shingrix (shingles)

## 2023-03-29 DIAGNOSIS — G3184 Mild cognitive impairment, so stated: Secondary | ICD-10-CM | POA: Insufficient documentation

## 2023-03-29 NOTE — Assessment & Plan Note (Signed)
MCI: The patient is forgetful, her MMSE score 26, mostly because she had a hard time spelling backwards [place].  Overall, she is doing well, we agreed to do some specific blood work at the next opportunity (RPR,Homocystine level and B12.  Last TSH normal). We talk about possible medication, Aricept.  Advised that she will have to take for years, has potential side effects. At this point I am not sure that these are her best interest and she agreed.  Will revisit the issue periodically.  Encouraged to stay active mentally. UTI: Urine culture +, received ciprofloxacin, was recommended to see urology if symptoms continue. Next visit: 4 months

## 2023-04-11 ENCOUNTER — Ambulatory Visit: Payer: Medicare PPO

## 2023-04-11 DIAGNOSIS — M216X2 Other acquired deformities of left foot: Secondary | ICD-10-CM

## 2023-04-11 DIAGNOSIS — L03115 Cellulitis of right lower limb: Secondary | ICD-10-CM

## 2023-04-11 DIAGNOSIS — L84 Corns and callosities: Secondary | ICD-10-CM

## 2023-04-11 DIAGNOSIS — L97512 Non-pressure chronic ulcer of other part of right foot with fat layer exposed: Secondary | ICD-10-CM

## 2023-04-11 DIAGNOSIS — M2041 Other hammer toe(s) (acquired), right foot: Secondary | ICD-10-CM

## 2023-04-11 DIAGNOSIS — M79661 Pain in right lower leg: Secondary | ICD-10-CM

## 2023-04-11 DIAGNOSIS — E11621 Type 2 diabetes mellitus with foot ulcer: Secondary | ICD-10-CM

## 2023-04-11 LAB — HM DIABETES FOOT EXAM

## 2023-04-11 NOTE — Progress Notes (Signed)
Patient presents to the office today for diabetic shoe and insole measuring.  Patient was measured with brannock device to determine size and width for 1 pair of extra depth shoes and foam casted for 3 pair of insoles.   Documentation of medical necessity will be sent to patient's treating diabetic doctor to verify and sign.   Patient's diabetic provider: Willow Ora MD   Shoes and insoles will be ordered at that time and patient will be notified for an appointment for fitting when they arrive.  Shoe size (per patient): 8.5 Brannock measurement: 7.5 womens Patient shoe selection- Shoe choice:   844 womens 9WD / [id number] Mens 8WD Shoe size ordered: 9WD  Financials signed  Addison Bailey Cped, CFo, CFm

## 2023-04-13 DIAGNOSIS — N952 Postmenopausal atrophic vaginitis: Secondary | ICD-10-CM | POA: Diagnosis not present

## 2023-04-13 DIAGNOSIS — N1330 Unspecified hydronephrosis: Secondary | ICD-10-CM | POA: Diagnosis not present

## 2023-04-18 ENCOUNTER — Telehealth: Payer: Self-pay

## 2023-04-18 NOTE — Telephone Encounter (Signed)
Diabetic shoe form completed and faxed back to [facility] at [phone]. Form sent for scanning.

## 2023-04-20 DIAGNOSIS — M7542 Impingement syndrome of left shoulder: Secondary | ICD-10-CM | POA: Diagnosis not present

## 2023-04-21 ENCOUNTER — Ambulatory Visit: Payer: Medicare PPO | Admitting: Podiatry

## 2023-04-21 DIAGNOSIS — L97509 Non-pressure chronic ulcer of other part of unspecified foot with unspecified severity: Secondary | ICD-10-CM

## 2023-04-21 DIAGNOSIS — L97512 Non-pressure chronic ulcer of other part of right foot with fat layer exposed: Secondary | ICD-10-CM | POA: Diagnosis not present

## 2023-04-21 DIAGNOSIS — E11621 Type 2 diabetes mellitus with foot ulcer: Secondary | ICD-10-CM

## 2023-04-21 MED ORDER — DOXYCYCLINE HYCLATE 100 MG PO TABS
100.0000 mg | ORAL_TABLET | Freq: Two times a day (BID) | ORAL | 0 refills | Status: AC
Start: 2023-04-21 — End: 2023-04-28

## 2023-04-21 NOTE — Progress Notes (Signed)
Subjective:  Patient ID: Natalie [name], female    DOB: [birth year],  MRN: [id number]  Chief Complaint  Patient presents with   Foot Ulcer    ulcer check,right great toe, red at the site of  the ulcer, per patient, has noticed a little drainage.    89 y.o. female presents for follow-up for right hallux ulceration.   Thinks there may be some new drainage from the wound.  Says open back up a little bit.  Past Medical History:  Diagnosis Date   Back pain, chronic    BCC (basal cell carcinoma of skin)    several, nose , 2012 L forehead   Degeneration of lumbar intervertebral disc    Dr. Ethelene Hal   Diabetes mellitus    HTN (hypertension)    Hyperlipidemia    Hyperparathyroidism    Lumbago    Lumbar epidural steroid injection at L5-S1 midline by Dr. Ethelene Hal   Pelvic pain in female 2005    back and pelvic pain, s/p w-u, CT (-), u/s showed a thick endometrium, s/p Bx per gyn   Ureteric obstruction    hydronephrosis,sees urology    Allergies  Allergen Reactions   Cefixime     unknown   Cefprozil Nausea And Vomiting   Cefuroxime Axetil Nausea And Vomiting   Nitrofurantoin     unknown   Penicillins     unknown   Sulfonamide Derivatives     unknown    ROS: Negative except as per HPI above  Objective:  General: AAO x3, NAD  Dermatological: Ulceration of the distal aspect of the right hallux with eschar overlying.  Upon debridement of the overlying eschar there is a superficial circular ulceration to this level subcutaneous fat tissue.  Very mild surrounding erythema.  Wound does not probe to bone.  Ulcer measures 0.9 x 0.6 x 0.2 cm postdebridement    Vascular:  Dorsalis Pedis artery and Posterior Tibial artery pedal pulses are 2/4 bilateral.  Capillary fill time < 3 sec to all digits.   Neruologic: Grossly absent to touch to the digit level  Musculoskeletal: Decreased edema to the right lower extremity improved from prior  Gait: Unassisted, Nonantalgic.      Radiographs:  Deferred Assessment:   1. 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  Ulcer at the distal aspect of right hallux -recurrent ulceration present without evidence of significant infection  -We discussed the etiology and factors that are a part of the wound healing process.  We also discussed the risk of infection both soft tissue and osteomyelitis from open ulceration.  Discussed the risk of limb loss if this happens or worsens. -Debridement as below -Dressed with antibiotic ointment and dry gauze and Coban -Cont daily mupirocin 2% ointment apply once daily to the area and cover with gauze -Weightbearing as tolerated in postop shoe which patient already has -E Rx for doxycycline 100 mg twice daily for 7 days -Referral to [facility] wound care center placed at this visit  Procedure: Excisional Debridement of Wound Rationale: Removal of non-viable soft tissue from the wound to promote healing.  Anesthesia: none Post-Debridement Wound Measurements: 0.9 cm x 0.6 cm x 0.2 cm  Type of Debridement: Sharp Excisional Tissue Removed: Non-viable soft tissue Depth of Debridement: subcutaneous tissue. Technique: Sharp excisional debridement to bleeding, viable wound  base.  Dressing: Dry, sterile, compression dressing. Disposition: Patient tolerated procedure well.   # Right calf pain and swelling -Previously negative for DVT with ultrasound   Return in about 4 weeks (around 05/19/2023) for f/u R hallux ulcer.          Corinna Gab, DPM [facility]

## 2023-04-25 ENCOUNTER — Telehealth: Payer: Self-pay | Admitting: Podiatry

## 2023-04-25 NOTE — Telephone Encounter (Signed)
Notified pt referral was sent to [facility] wound and gave pt number to call if needed.

## 2023-04-25 NOTE — Telephone Encounter (Signed)
Pt called and was referred to wound care and they called her today and told her they were not taking any new pts until January. Is there somewhere else she could go ( maybe [place] )

## 2023-04-27 NOTE — Telephone Encounter (Signed)
Patient called this morning and left a message. [facility] wound care reached out to her - the soonest appointment they have is 06/01/23 - Patient took that appointment. She wants to know what she should do in the meantime.Thanks

## 2023-05-02 ENCOUNTER — Other Ambulatory Visit: Payer: Self-pay | Admitting: Internal Medicine

## 2023-05-02 ENCOUNTER — Other Ambulatory Visit: Payer: Self-pay | Admitting: Physician Assistant

## 2023-05-02 ENCOUNTER — Ambulatory Visit
Admission: RE | Admit: 2023-05-02 | Discharge: 2023-05-02 | Disposition: A | Discharge status: Home / Self Care | Payer: Medicare PPO | Source: Ambulatory Visit | Attending: Physician Assistant

## 2023-05-02 ENCOUNTER — Ambulatory Visit
Admission: RE | Admit: 2023-05-02 | Discharge: 2023-05-02 | Disposition: A | Discharge status: Home / Self Care | Payer: Medicare PPO | Source: Ambulatory Visit | Attending: Physician Assistant | Admitting: Physician Assistant

## 2023-05-02 ENCOUNTER — Encounter: Payer: Medicare PPO | Attending: Physician Assistant | Admitting: Physician Assistant

## 2023-05-02 DIAGNOSIS — S81801D Unspecified open wound, right lower leg, subsequent encounter: Secondary | ICD-10-CM

## 2023-05-02 DIAGNOSIS — L97512 Non-pressure chronic ulcer of other part of right foot with fat layer exposed: Secondary | ICD-10-CM | POA: Insufficient documentation

## 2023-05-02 DIAGNOSIS — I10 Essential (primary) hypertension: Secondary | ICD-10-CM | POA: Insufficient documentation

## 2023-05-02 DIAGNOSIS — E11621 Type 2 diabetes mellitus with foot ulcer: Secondary | ICD-10-CM | POA: Insufficient documentation

## 2023-05-02 DIAGNOSIS — M7731 Calcaneal spur, right foot: Secondary | ICD-10-CM | POA: Diagnosis not present

## 2023-05-02 NOTE — Progress Notes (Signed)
[name], [name] ([id number]) [id number] Page 1 of 5 Visit Report for 05/02/2023 Abuse Risk Screen Details Patient Name: Date of Service: Natalie [name] [name] 05/02/2023 8:15 A M Medical Record Number: [id number] Patient Account Number: [id number] Date of Birth/Sex: Treating RN: [birth year] (89 y.o. Natalie [name] Primary Care Jaymar Loeber: Willow Ora Other Clinician: Referring Wilmore Holsomback: Treating Rhyanna Sorce/Extender: Waylan Boga in Treatment: 0 Abuse Risk Screen Items Answer ABUSE RISK SCREEN: Has anyone close to you tried to hurt or harm you recentlyo No Do you feel uncomfortable with anyone in your familyo No Has anyone forced you do things that you didnt want to doo No Electronic Signature(s) Signed: 05/02/2023 4:17:39 PM By: Angelina Pih Entered By: Angelina Pih on 05/02/2023 08:39:44 -------------------------------------------------------------------------------- Activities of Daily Living Details Patient Name: Date of Service: Natalie [name] [name] 05/02/2023 8:15 A M Medical Record Number: [id number] Patient Account Number: [id number] Date of Birth/Sex: Treating RN: 01-09-1933 (89 y.o. Natalie [name] Primary Care Noa Galvao: Willow Ora Other Clinician: Referring Ichael Pullara: Treating Dawana Asper/Extender: Waylan Boga in Treatment: 0 Activities of Daily Living Items Answer Activities of Daily Living (Please select one for each item) Drive Automobile Not Able T Medications ake Completely Able Use T elephone Completely Able Care for Appearance Completely Able Use T oilet Completely Able Bath / Shower Need Assistance Dress Self Completely Able Feed Self Completely Able Walk Need Assistance Get In / Out Bed Need Assistance Housework Need Assistance JEYLAH, HOWORTH T ([id number]) [id number] Page 2 of 5 Prepare Meals Need Assistance Handle Money  Completely Able Shop for Self Need Assistance Electronic Signature(s) Signed: 05/02/2023 4:17:39 PM By: Angelina Pih Entered By: Angelina Pih on 05/02/2023 08:40:19 -------------------------------------------------------------------------------- Education Screening Details Patient Name: Date of Service: [name], [name] 05/02/2023 8:15 A M Medical Record Number: [id number] Patient Account Number: [id number] Date of Birth/Sex: Treating RN: 1932/12/30 (89 y.o. Natalie [name] Primary Care Malaya Cagley: Willow Ora Other Clinician: Referring Ran Tullis: Treating Sung Renton/Extender: Waylan Boga in Treatment: 0 Learning Preferences/Education Level/Primary Language Learning Preference: Explanation, Demonstration, Video, Communication Board, Printed Material Preferred Language: English Cognitive Barrier Language Barrier: No Translator Needed: No Memory Deficit: No Emotional Barrier: No Cultural/Religious Beliefs Affecting Medical Care: No Physical Barrier Impaired Vision: Yes Glasses Impaired Hearing: No Decreased Hand dexterity: No Knowledge/Comprehension Knowledge Level: High Comprehension Level: High Ability to understand written instructions: High Ability to understand verbal instructions: High Motivation Anxiety Level: Calm Cooperation: Cooperative Education Importance: Acknowledges Need Interest in Health Problems: Asks Questions Perception: Coherent Willingness to Engage in Self-Management High Activities: Readiness to Engage in Self-Management High Activities: Electronic Signature(s) Signed: 05/02/2023 4:17:39 PM By: Angelina Pih Entered By: Angelina Pih on 05/02/2023 08:40:44 [name] ([id number]) [phone] [id number] Page 3 of 5 -------------------------------------------------------------------------------- Fall Risk Assessment Details Patient Name: Date of Service: Natalie [name] [name]  05/02/2023 8:15 A M Medical Record Number: [id number] Patient Account Number: [id number] Date of Birth/Sex: Treating RN: [birth year] (89 y.o. Natalie [name] Primary Care Keyshawna Prouse: Willow Ora Other Clinician: Referring Labrisha Wuellner: Treating Yunus Stoklosa/Extender: Waylan Boga in Treatment: 0 Fall Risk Assessment Items Have you had 2 or more falls in the last 12 monthso 0 No Have you had any fall that resulted in injury in the last 12 monthso 0 No FALLS RISK SCREEN History of falling - immediate or within 3 months 0 No Secondary diagnosis (Do you have 2 or more medical diagnoseso) 0 No Ambulatory aid None/bed rest/wheelchair/nurse 0 Yes  Crutches/cane/walker 0 No Furniture 0 No Intravenous therapy Access/Saline/Heparin Lock 0 No Gait/Transferring Normal/ bed rest/ wheelchair 0 Yes Weak (short steps with or without shuffle, stooped but able to lift head while walking, may seek 0 No support from furniture) Impaired (short steps with shuffle, may have difficulty arising from chair, head down, impaired 0 No balance) Mental Status Oriented to own ability 0 Yes Electronic Signature(s) Signed: 05/02/2023 4:17:39 PM By: Angelina Pih Entered By: Angelina Pih on 05/02/2023 08:40:51 -------------------------------------------------------------------------------- Foot Assessment Details Patient Name: Date of Service: Natalie [name] [name] 05/02/2023 8:15 A M Medical Record Number: [id number] Patient Account Number: [id number] Date of Birth/Sex: Treating RN: Sep 04, 1932 (89 y.o. Natalie [name] Primary Care Lalo Tromp: Willow Ora Other Clinician: Referring Domani Bakos: Treating Candice Lunney/Extender: Waylan Boga in Treatment: 0 Foot Assessment Items Site Locations SELVA, NOONER T ([id number]) [id number] Page 4 of 5 + = Sensation present, - = Sensation absent, C = Callus, U = Ulcer R = Redness, W = Warmth,  M = Maceration, PU = Pre-ulcerative lesion F = Fissure, S = Swelling, D = Dryness Assessment Right: Left: Other Deformity: No No Prior Foot Ulcer: No No Prior Amputation: No No Charcot Joint: No No Ambulatory Status: Ambulatory With Help Assistance Device: Walker GaitFutures trader) Signed: 05/02/2023 4:17:39 PM By: Angelina Pih Entered By: Angelina Pih on 05/02/2023 08:46:46 -------------------------------------------------------------------------------- Nutrition Risk Screening Details Patient Name: Date of Service: Natalie [name] [name] 05/02/2023 8:15 A M Medical Record Number: [id number] Patient Account Number: [id number] Date of Birth/Sex: Treating RN: 1932-06-13 (89 y.o. Natalie [name] Primary Care Maicie Vanderloop: Willow Ora Other Clinician: Referring Samiksha Pellicano: Treating Miko Sirico/Extender: Waylan Boga in Treatment: 0 Height (in): 66 Weight (lbs): 176 Body Mass Index (BMI): 28.4 Nutrition Risk Screening Items Score Screening NUTRITION RISK SCREEN: I have an illness or condition that made me change the kind and/or amount of food I eat 0 No I eat fewer than two meals per day 0 No I eat few fruits and vegetables, or milk products 0 No I have three or more drinks of beer, liquor or wine almost every day 0 No I have tooth or mouth problems that make it hard for me to eat 0 No FRANNY, DEBAKER T ([id number]) [phone] [id number] Page 5 of 5 I don't always have enough money to buy the food I need 0 No I eat alone most of the time 0 No I take three or more different prescribed or over-the-counter drugs a day 0 No Without wanting to, I have lost or gained 10 pounds in the last six months 0 No I am not always physically able to shop, cook and/or feed myself 0 No Nutrition Protocols Good Risk Protocol 0 No interventions needed Moderate Risk Protocol High Risk Proctocol Risk Level: Good Risk Score:  0 Electronic Signature(s) Signed: 05/02/2023 4:17:39 PM By: Angelina Pih Entered By: Angelina Pih on 05/02/2023 08:41:02

## 2023-05-02 NOTE — Progress Notes (Signed)
Natalie [name] ([id number]) [id number] Page 1 of 9 Visit Report for 05/02/2023 Allergy List Details Patient Name: Date of Service: Natalie [name] Natalie [name]. 05/02/2023 8:15 A M Medical Record Number: [id number] Patient Account Number: [id number] Date of Birth/Sex: Treating RN: [birth year] (89 y.o. Natalie [name] Primary Care Alexandrina Fiorini: Willow Ora Other Clinician: Referring Elissia Spiewak: Treating Fendi Meinhardt/Extender: Waylan Boga in Treatment: 0 Allergies Active Allergies cefixime cefprozil cefuroxime nitrofurantoin penicillin Sulfa (Sulfonamide Antibiotics) Allergy Notes Electronic Signature(s) Signed: 05/02/2023 4:17:39 PM By: Angelina Pih Entered By: Angelina Pih on 05/02/2023 09:16:04 -------------------------------------------------------------------------------- Arrival Information Details Patient Name: Date of Service: Natalie [name], Natalie Natalie [name]. 05/02/2023 8:15 A M Medical Record Number: [id number] Patient Account Number: [id number] Date of Birth/Sex: Treating RN: February 18, 1933 (89 y.o. Natalie [name] Primary Care Owens Hara: Willow Ora Other Clinician: Referring Laketia Vicknair: Treating Torah Pinnock/Extender: Waylan Boga in Treatment: 0 Visit Information Patient Arrived: Dan Humphreys Arrival Time: 08:29 Accompanied By: family Transfer Assistance: None Natalie [name], Natalie [name] ([id number]) [id number] Page 2 of 9 Patient Identification Verified: Yes Secondary Verification Process Completed: Yes Patient Has Alerts: Yes Patient Alerts: Type 2 diabetic Electronic Signature(s) Signed: 05/02/2023 4:17:39 PM By: Angelina Pih Entered By: Angelina Pih on 05/02/2023 09:20:51 -------------------------------------------------------------------------------- Clinic Level of Care Assessment Details Patient Name: Date of Service: Natalie [name] Natalie [name]. 05/02/2023 8:15 A M Medical Record Number:  [id number] Patient Account Number: [id number] Date of Birth/Sex: Treating RN: [birth year] (89 y.o. Natalie [name] Primary Care Tearia Gibbs: Willow Ora Other Clinician: Referring Trea Carnegie: Treating Yecenia Dalgleish/Extender: Waylan Boga in Treatment: 0 Clinic Level of Care Assessment Items TOOL 1 Quantity Score []  - 0 Use when EandM and Procedure is performed on INITIAL visit ASSESSMENTS - Nursing Assessment / Reassessment X- 1 20 General Physical Exam (combine w/ comprehensive assessment (listed just below) when performed on new pt. evals) X- 1 25 Comprehensive Assessment (HX, ROS, Risk Assessments, Wounds Hx, etc.) ASSESSMENTS - Wound and Skin Assessment / Reassessment []  - 0 Dermatologic / Skin Assessment (not related to wound area) ASSESSMENTS - Ostomy and/or Continence Assessment and Care []  - 0 Incontinence Assessment and Management []  - 0 Ostomy Care Assessment and Management (repouching, etc.) PROCESS - Coordination of Care X - Simple Patient / Family Education for ongoing care 1 15 []  - 0 Complex (extensive) Patient / Family Education for ongoing care X- 1 10 Staff obtains Chiropractor, Records, [name] Results / Process Orders est []  - 0 Staff telephones HHA, Nursing Homes / Clarify orders / etc []  - 0 Routine Transfer to another Facility (non-emergent condition) []  - 0 Routine Hospital Admission (non-emergent condition) X- 1 15 New Admissions / Manufacturing engineer / Ordering NPWT Apligraf, etc. , []  - 0 Emergency Hospital Admission (emergent condition) PROCESS - Special Needs []  - 0 Pediatric / Minor Patient Management []  - 0 Isolation Patient Management []  - 0 Hearing / Language / Visual special needs []  - 0 Assessment of Community assistance (transportation, D/C planning, etc.) []  - 0 Additional assistance / Altered mentation []  - 0 Support Surface(s) Assessment (bed, cushion, seat, etc.) Natalie [name], Natalie [name] ([id number])  [id number] Page 3 of 9 INTERVENTIONS - Miscellaneous []  - 0 External ear exam []  - 0 Patient Transfer (multiple staff / Nurse, adult / Similar devices) []  - 0 Simple Staple / Suture removal (25 or less) []  - 0 Complex Staple / Suture removal (26 or more) []  - 0 Hypo/Hyperglycemic Management (do not check if billed separately) X- 1 15 Ankle / Brachial Index (ABI) -  do not check if billed separately Has the patient been seen at the hospital within the last three years: Yes Total Score: 100 Level Of Care: New/Established - Level 3 Electronic Signature(s) Signed: 05/02/2023 4:17:39 PM By: Angelina Pih Entered By: Angelina Pih on 05/02/2023 10:21:24 -------------------------------------------------------------------------------- Encounter Discharge Information Details Patient Name: Date of Service: Natalie [name], Natalie Natalie [name]. 05/02/2023 8:15 A M Medical Record Number: [id number] Patient Account Number: [id number] Date of Birth/Sex: Treating RN: [birth year] (89 y.o. Natalie [name] Primary Care Gatha Mcnulty: Willow Ora Other Clinician: Referring Jerene Yeager: Treating Kenan Moodie/Extender: Waylan Boga in Treatment: 0 Encounter Discharge Information Items Post Procedure Vitals Discharge Condition: Stable Temperature (F): 97.6 Ambulatory Status: Walker Pulse (bpm): 85 Discharge Destination: Home Respiratory Rate (breaths/min): 18 Transportation: Private Auto Blood Pressure (mmHg): 127/76 Accompanied By: family Schedule Follow-up Appointment: Yes Clinical Summary of Care: Electronic Signature(s) Signed: 05/02/2023 10:27:49 AM By: Angelina Pih Entered By: Angelina Pih on 05/02/2023 10:27:49 -------------------------------------------------------------------------------- Lower Extremity Assessment Details Patient Name: Date of Service: Natalie [name] Natalie [name]. 05/02/2023 8:15 A M Medical Record Number: [id number] Patient Account  Number: [id number] Date of Birth/Sex: Treating RN: 1932-08-31 (89 y.o. Natalie [name] [place] [name] ([id number]) [id number] Page 4 of 9 Primary Care Nhan Qualley: Willow Ora Other Clinician: Referring Vernal Hritz: Treating Shaylyn Bawa/Extender: Waylan Boga in Treatment: 0 Edema Assessment Assessed: [Left: No] [Right: No] Edema: [Left: Yes] [Right: Yes] Calf Left: Right: Point of Measurement: 35 cm From Medial Instep 37 cm Ankle Left: Right: Point of Measurement: 9 cm From Medial Instep 27.3 cm Vascular Assessment Pulses: Dorsalis Pedis Palpable: [Right:Yes] Posterior Tibial Doppler Audible: [Right:Yes] Extremity colors, hair growth, and conditions: Extremity Color: [Right:Normal] Hair Growth on Extremity: [Right:No] Temperature of Extremity: [Right:Warm] Capillary Refill: [Right:< 3 seconds] Blood Pressure: Brachial: [Right:127] Ankle: [Right:Dorsalis Pedis: 101 0.80] Toe Nail Assessment Left: Right: Thick: Yes Discolored: Yes Deformed: Yes Improper Length and Hygiene: Yes Electronic Signature(s) Signed: 05/02/2023 4:17:39 PM By: Angelina Pih Previous Signature: 05/02/2023 9:04:21 AM Version By: Angelina Pih Entered By: Angelina Pih on 05/02/2023 09:19:34 -------------------------------------------------------------------------------- Multi Wound Chart Details Patient Name: Date of Service: Natalie [name], Natalie Natalie [name]. 05/02/2023 8:15 A M Medical Record Number: [id number] Patient Account Number: [id number] Date of Birth/Sex: Treating RN: 05-08-1933 (89 y.o. Natalie [name] Primary Care Rielyn Krupinski: Willow Ora Other Clinician: Referring Leshawn Straka: Treating Dreonna Hussein/Extender: Waylan Boga in Treatment: 0 Vital Signs Height(in): 66 Pulse(bpm): 85 Weight(lbs): 176 Blood Pressure(mmHg): 127/76 Body Mass Index(BMI): 28.4 Temperature(F): 97.6 Natalie [name], Natalie [name] ([id number])  [id number] Page 5 of 9 Respiratory Rate(breaths/min): 18 [1:Photos:] [N/A:N/A] Right [name] Great oe N/A N/A Wound Location: Gradually Appeared N/A N/A Wounding Event: Diabetic Wound/Ulcer of the Lower N/A N/A Primary Etiology: Extremity Chronic sinus problems/congestion, N/A N/A Comorbid History: Asthma, Hypertension, Type II Diabetes 12/15/2022 N/A N/A Date Acquired: 0 N/A N/A Weeks of Treatment: Open N/A N/A Wound Status: No N/A N/A Wound Recurrence: 0.5x0.3x0.2 N/A N/A Measurements L x W x D (cm) 0.118 N/A N/A A (cm) : rea 0.024 N/A N/A Volume (cm) : Grade 2 N/A N/A Classification: Medium N/A N/A Exudate A mount: Serosanguineous N/A N/A Exudate Type: red, brown N/A N/A Exudate Color: Small (1-33%) N/A N/A Granulation A mount: Red, Pink N/A N/A Granulation Quality: Large (67-100%) N/A N/A Necrotic A mount: Fat Layer (Subcutaneous Tissue): Yes N/A N/A Exposed Structures: None N/A N/A Epithelialization: Treatment Notes Electronic Signature(s) Signed: 05/02/2023 4:17:39 PM By: Angelina Pih Entered By: Angelina Pih on 05/02/2023 09:22:17 -------------------------------------------------------------------------------- Multi-Disciplinary Care Plan Details  Patient Name: Date of Service: Natalie [name] Natalie [name]. 05/02/2023 8:15 A M Medical Record Number: [id number] Patient Account Number: [id number] Date of Birth/Sex: Treating RN: 12-18-1932 (89 y.o. Natalie [name] Primary Care Ladine Kiper: Willow Ora Other Clinician: Referring Nikaela Coyne: Treating Natayah Warmack/Extender: Waylan Boga in Treatment: 0 Active Inactive Necrotic Tissue Nursing Diagnoses: Impaired tissue integrity related to necrotic/devitalized tissue Knowledge deficit related to management of necrotic/devitalized tissue Natalie [name], Natalie [name] ([id number]) [id number] Page 6 of 9 Goals: Necrotic/devitalized tissue will be  minimized in the wound bed Date Initiated: 05/02/2023 Target Resolution Date: 06/27/2023 Goal Status: Active Patient/caregiver will verbalize understanding of reason and process for debridement of necrotic tissue Date Initiated: 05/02/2023 Date Inactivated: 05/02/2023 Target Resolution Date: 05/02/2023 Goal Status: Met Interventions: Assess patient pain level pre-, during and post procedure and prior to discharge Provide education on necrotic tissue and debridement process Treatment Activities: Apply topical anesthetic as ordered : 05/02/2023 Excisional debridement : 05/02/2023 Notes: Wound/Skin Impairment Nursing Diagnoses: Impaired tissue integrity Goals: Ulcer/skin breakdown will have a volume reduction of 30% by week 4 Date Initiated: 05/02/2023 Target Resolution Date: 05/30/2023 Goal Status: Active Ulcer/skin breakdown will have a volume reduction of 50% by week 8 Date Initiated: 05/02/2023 Target Resolution Date: 06/27/2023 Goal Status: Active Ulcer/skin breakdown will have a volume reduction of 80% by week 12 Date Initiated: 05/02/2023 Target Resolution Date: 07/25/2023 Goal Status: Active Ulcer/skin breakdown will heal within 14 weeks Date Initiated: 05/02/2023 Target Resolution Date: 08/08/2023 Goal Status: Active Interventions: Assess patient/caregiver ability to obtain necessary supplies Assess patient/caregiver ability to perform ulcer/skin care regimen upon admission and as needed Assess ulceration(s) every visit Provide education on ulcer and skin care Notes: Electronic Signature(s) Signed: 05/02/2023 10:26:38 AM By: Angelina Pih Entered By: Angelina Pih on 05/02/2023 10:26:38 -------------------------------------------------------------------------------- Pain Assessment Details Patient Name: Date of Service: Natalie [name] Natalie [name]. 05/02/2023 8:15 A M Medical Record Number: [id number] Patient Account Number: [id number] Date of Birth/Sex: Treating  RN: [birth year] (89 y.o. Natalie [name] Primary Care Amarie Tarte: Willow Ora Other Clinician: Referring Aryanne Gilleland: Treating Yoltzin Ransom/Extender: Waylan Boga in Treatment: 0 Active Problems Location of Pain Severity and Description of Pain Patient Has Paino No Natalie [name], Natalie [name] ([id number]) [id number] Page 7 of 9 Patient Has Paino No Site Locations Pain Management and Medication Current Pain Management: Electronic Signature(s) Signed: 05/02/2023 4:17:39 PM By: Angelina Pih Entered By: Angelina Pih on 05/02/2023 08:29:51 -------------------------------------------------------------------------------- Patient/Caregiver Education Details Patient Name: Date of Service: Natalie [name], Natalie Natalie [name]. 12/16/2024andnbsp8:15 A M Medical Record Number: [id number] Patient Account Number: [id number] Date of Birth/Gender: Treating RN: 08-09-1932 (89 y.o. Natalie [name] Primary Care Physician: Willow Ora Other Clinician: Referring Physician: Treating Physician/Extender: Waylan Boga in Treatment: 0 Education Assessment Education Provided To: Patient Education Topics Provided Welcome [name]-New Patient Packet: o Handouts: The Wound Healing Pledge form, [name] o Methods: Explain/Verbal Responses: State content correctly Wound Debridement: Handouts: Wound Debridement Methods: Explain/Verbal Responses: State content correctly Wound/Skin Impairment: Handouts: Caring for Your Ulcer Methods: Explain/Verbal Responses: State content correctly Natalie [name] ([id number]) [id number] Page 8 of 9 Electronic Signature(s) Signed: 05/02/2023 4:17:39 PM By: Angelina Pih Entered By: Angelina Pih on 05/02/2023 10:26:58 -------------------------------------------------------------------------------- Wound Assessment Details Patient Name: Date of  Service: Natalie [name] Natalie [name]. 05/02/2023 8:15 A M Medical Record Number: [id number] Patient Account Number: [id number] Date of Birth/Sex: Treating RN: [birth year] (89 y.o. Natalie [name] Primary Care Jaysun Wessels: Willow Ora Other Clinician:  Referring Adriana Quinby: Treating Rohith Fauth/Extender: Waylan Boga in Treatment: 0 Wound Status Wound Number: 1 Primary Diabetic Wound/Ulcer of the Lower Extremity Etiology: Wound Location: Right [name] Great oe Wound Status: Open Wounding Event: Gradually Appeared Comorbid Chronic sinus problems/congestion, Asthma, Hypertension, Date Acquired: 12/15/2022 History: Type II Diabetes Weeks Of Treatment: 0 Clustered Wound: No Photos Wound Measurements Length: (cm) 0.5 Width: (cm) 0.3 Depth: (cm) 0.4 Area: (cm) 0.118 Volume: (cm) 0.047 % Reduction in Area: % Reduction in Volume: Epithelialization: None Tunneling: No Undermining: No Wound Description Classification: Grade 2 Exudate Amount: Medium Exudate Type: Serosanguineous Exudate Color: red, brown Foul Odor After Cleansing: No Slough/Fibrino Yes Wound Bed Granulation Amount: Small (1-33%) Exposed Structure Granulation Quality: Red, Pink Fat Layer (Subcutaneous Tissue) Exposed: Yes Necrotic Amount: Large (67-100%) Treatment Notes Wound #1 (Toe Great) Wound Laterality: Right Cleanser Vashe 5.8 (oz) Discharge Instruction: Use vashe 5.8 (oz) as directed Natalie [name] ([id number]) [id number] Page 9 of 9 Wound Cleanser Discharge Instruction: Wash your hands with soap and water. Remove old dressing, discard into plastic bag and place into trash. Cleanse the wound with Wound Cleanser prior to applying a clean dressing using gauze sponges, not tissues or cotton balls. Do not scrub or use excessive force. Pat dry using gauze sponges, not tissue or cotton balls. Peri-Wound Care Topical Primary Dressing Endoform Natural, Non-fenestrated, 2x2  (in/in) Secondary Dressing Conforming Guaze Roll-Small Discharge Instruction: Apply Conforming Stretch Guaze Bandage as directed Gauze Discharge Instruction: As directed: dry Secured With Conform 2''- Conforming Stretch Gauze Bandage 2x75 (in/in) Discharge Instruction: Apply as directed Compression Wrap Compression Stockings Add-Ons Electronic Signature(s) Signed: 05/02/2023 4:17:39 PM By: Angelina Pih Entered By: Angelina Pih on 05/02/2023 09:25:37 -------------------------------------------------------------------------------- Vitals Details Patient Name: Date of Service: Natalie [name], Natalie Natalie [name]. 05/02/2023 8:15 A M Medical Record Number: [id number] Patient Account Number: [id number] Date of Birth/Sex: Treating RN: 01-Apr-1933 (89 y.o. Natalie [name] Primary Care Noris Kulinski: Willow Ora Other Clinician: Referring Ewing Fandino: Treating Broderic Bara/Extender: Waylan Boga in Treatment: 0 Vital Signs Time Taken: 08:31 Temperature (F): 97.6 Height (in): 66 Pulse (bpm): 85 Source: Stated Respiratory Rate (breaths/min): 18 Weight (lbs): 176 Blood Pressure (mmHg): 127/76 Source: Stated Reference Range: 80 - 120 mg / dl Body Mass Index (BMI): 28.4 Electronic Signature(s) Signed: 05/02/2023 4:17:39 PM By: Angelina Pih Entered By: Angelina Pih on 05/02/2023 08:31:39

## 2023-05-02 NOTE — Progress Notes (Signed)
Natalie [name] ([id number]) [id number] Page 1 of 11 Visit Report for 05/02/2023 Chief Complaint Document Details Patient Name: Date of Service: Natalie [name] Natalie [name]. 05/02/2023 8:15 A M Medical Record Number: [id number] Patient Account Number: [id number] Date of Birth/Sex: Treating RN: 1932/10/14 (89 y.o. Natalie [name] Primary Care Provider: Willow Ora Other Clinician: Referring Provider: Treating Provider/Extender: Waylan Boga in Treatment: 0 Information Obtained from: Patient Chief Complaint Right great toe ulcer Electronic Signature(s) Signed: 05/02/2023 9:09:21 AM By: Allen Derry PA-C Entered By: Allen Derry on 05/02/2023 09:09:21 -------------------------------------------------------------------------------- Debridement Details Patient Name: Date of Service: Natalie [name], Natalie Natalie [name]. 05/02/2023 8:15 A M Medical Record Number: [id number] Patient Account Number: [id number] Date of Birth/Sex: Treating RN: 18-Dec-1932 (89 y.o. Natalie [name] Primary Care Provider: Willow Ora Other Clinician: Referring Provider: Treating Provider/Extender: Waylan Boga in Treatment: 0 Debridement Performed for Assessment: Wound #1 Right [name] Great oe Performed By: Physician Allen Derry, PA-C The following information was scribed by: Angelina Pih The information was scribed for: Allen Derry Debridement Type: Debridement Severity of Tissue Pre Debridement: Fat layer exposed Level of Consciousness (Pre-procedure): Awake and Alert Pre-procedure Verification/Time Out Yes - 09:22 Taken: Pain Control: Lidocaine 4% [name] opical Solution Percent of Wound Bed Debrided: 100% [name] Area Debrided (cm): otal 0.12 Tissue and other material debrided: Viable, Non-Viable, Callus, Slough, Subcutaneous, Slough Level: Skin/Subcutaneous Tissue Debridement Description: Excisional Instrument: Curette Bleeding: Moderate Hemostasis  Achieved: Pressure Natalie [name], Natalie [name] ([id number]) [id number] Page 2 of 11 Response to Treatment: Procedure was tolerated well Level of Consciousness (Post- Awake and Alert procedure): Post Debridement Measurements of Total Wound Length: (cm) 0.5 Width: (cm) 0.3 Depth: (cm) 0.4 Volume: (cm) 0.047 Character of Wound/Ulcer Post Debridement: Stable Severity of Tissue Post Debridement: Fat layer exposed Post Procedure Diagnosis Same as Pre-procedure Electronic Signature(s) Signed: 05/02/2023 4:17:39 PM By: Angelina Pih Signed: 05/02/2023 4:20:01 PM By: Allen Derry PA-C Entered By: Angelina Pih on 05/02/2023 09:25:27 -------------------------------------------------------------------------------- HPI Details Patient Name: Date of Service: Natalie [name], Natalie Natalie [name]. 05/02/2023 8:15 A M Medical Record Number: [id number] Patient Account Number: [id number] Date of Birth/Sex: Treating RN: 03/13/1933 (89 y.o. Natalie [name] Primary Care Provider: Willow Ora Other Clinician: Referring Provider: Treating Provider/Extender: Waylan Boga in Treatment: 0 History of Present Illness HPI Description: 05-02-2023 upon evaluation today patient presents for initial evaluation in the clinic although this is an individual whom I am seeing previously when I was working with pain management although that has been about 8 years ago. Fortunately I do not see any signs of active infection at this time which is great news. With that being said this wound has been present for about 4 months. She is not sure if she had an ingrown toenail that she was clipping in that area when something happened or not it has honestly been so far removed she is unsure. She has been seeing podiatry at [facility] and subsequently they have been treating this although she tells me that most recently they have been using mupirocin and they have been trying to keep  her from getting this wet. Subsequently she has been on doxycycline though I do not believe she is on anything right now she tells me this has been infected a couple times with the course of treatment thus far. Her most notable change that she is seeing is when she is wearing the offloading postop shoe this seems to be better for her than using just a  regular shoe which is when it reoccurred when she thought she had it healed although I think this may have just been more of a callusing over. I am unsure however as I was not involved in her care during that time. Podiatry has ordered new custom shoes but they are not in yet. Patient does have a history of diabetes mellitus type 2 most recent hemoglobin A1c on 02-21-2023 was 6.1. She also tells me that she has a history with hypertension this seems to be very well-controlled for the most part. Otherwise no major medical problems in general. Electronic Signature(s) Signed: 05/02/2023 10:15:08 AM By: Allen Derry PA-C Entered By: Allen Derry on 05/02/2023 10:15:08 Natalie [name] ([id number]) [id number] Page 3 of 11 -------------------------------------------------------------------------------- Physical Exam Details Patient Name: Date of Service: Natalie [name] Natalie [name]. 05/02/2023 8:15 A M Medical Record Number: [id number] Patient Account Number: [id number] Date of Birth/Sex: Treating RN: 1933-04-12 (89 y.o. Natalie [name] Primary Care Provider: Willow Ora Other Clinician: Referring Provider: Treating Provider/Extender: Waylan Boga in Treatment: 0 Constitutional sitting or standing blood pressure is within target range for patient.. pulse regular and within target range for patient.Marland Kitchen respirations regular, non-labored and within target range for patient.Marland Kitchen temperature within target range for patient.. Well-nourished and well-hydrated in no acute distress. Eyes conjunctiva clear no eyelid  edema noted. pupils equal round and reactive to light and accommodation. Ears, Nose, Mouth, and Throat no gross abnormality of ear auricles or external auditory canals. normal hearing noted during conversation. mucus membranes moist. Respiratory normal breathing without difficulty. Cardiovascular 1+ dorsalis pedis/posterior tibialis pulses. no clubbing, cyanosis, significant edema, <3 sec cap refill. Musculoskeletal normal gait and posture. no significant deformity or arthritic changes, no loss or range of motion, no clubbing. Psychiatric this patient is able to make decisions and demonstrates good insight into disease process. Alert and Oriented x 3. pleasant and cooperative. Notes Upon inspection patient's wound bed actually did show some signs of callus as well as slough and biofilm buildup in the surface of the wound. I discussed with her that I do believe that this is gena be something that we need to clean away I explained how all debridement will subsequently stimulate her as far as healing is concerned and can improve things overall. I think that this is gena be one of the biggest things that we need to do her ABI was 0.8 I do want to make sure that her arterial flow is good although I suspect it is probably okay at the same time her pulses were somewhat faint and 1+ bilaterally at dorsalis pedis and posterior tibial I would like to get a full reading on this with arterial study with ABI and TBI. Electronic Signature(s) Signed: 05/02/2023 10:15:57 AM By: Allen Derry PA-C Entered By: Allen Derry on 05/02/2023 10:15:56 -------------------------------------------------------------------------------- Physician Orders Details Patient Name: Date of Service: Natalie [name], Natalie Natalie [name]. 05/02/2023 8:15 A M Medical Record Number: [id number] Patient Account Number: [id number] Date of Birth/Sex: Treating RN: February 24, 1933 (89 y.o. Natalie [name] Primary Care Provider: Willow Ora Other  Clinician: Referring Provider: Treating Provider/Extender: Waylan Boga in Treatment: 0 Natalie [name], Natalie [name] ([id number]) [id number] Page 4 of 11 Verbal / Phone Orders: No Diagnosis Coding ICD-10 Coding Code Description E11.621 Type 2 diabetes mellitus with foot ulcer L97.512 Non-pressure chronic ulcer of other part of right foot with fat layer exposed I10 Essential (primary) hypertension Follow-up Appointments Return Appointment in 1 week. Bathing/ Shower/ Hygiene May shower; gently  cleanse wound with antibacterial soap, rinse and pat dry prior to dressing wounds - Shower on days you change the dressing, take your shower with dressing on, then at end of shower take dressing off, clean with Dial original GOLD soap, dry and apply a new dressing. May shower with wound dressing protected with water repellent cover or cast protector. - If showering on days you are not changing dressing, you will need to protect area from becoming wet. No tub bath. Anesthetic (Use 'Patient Medications' Section for Anesthetic Order Entry) Lidocaine applied to wound bed Edema Control - Orders / Instructions Elevate, Exercise Daily and A void Standing for Long Periods of Time. Elevate legs to the level of the heart and pump ankles as often as possible Elevate leg(s) parallel to the floor when sitting. DO YOUR BEST to sleep in the bed at night. DO NOT sleep in your recliner. Long hours of sitting in a recliner leads to swelling of the legs and/or potential wounds on your backside. Wound Treatment Wound #1 - [name] Great oe Wound Laterality: Right Cleanser: Vashe 5.8 (oz) 3 x Per Week/15 Days Discharge Instructions: Use vashe 5.8 (oz) as directed Cleanser: Wound Cleanser 3 x Per Week/15 Days Discharge Instructions: Wash your hands with soap and water. Remove old dressing, discard into plastic bag and place into trash. Cleanse the wound with Wound Cleanser  prior to applying a clean dressing using gauze sponges, not tissues or cotton balls. Do not scrub or use excessive force. Pat dry using gauze sponges, not tissue or cotton balls. Prim Dressing: Endoform Natural, Non-fenestrated, 2x2 (in/in) ary 3 x Per Week/15 Days Secondary Dressing: Conforming Guaze Roll-Small 3 x Per Week/15 Days Discharge Instructions: Apply Conforming Stretch Guaze Bandage as directed Secondary Dressing: Gauze 3 x Per Week/15 Days Discharge Instructions: As directed: dry Secured With: Conform 2''- Conforming Stretch Gauze Bandage 2x75 (in/in) 3 x Per Week/15 Days Discharge Instructions: Apply as directed Radiology X-ray, foot - focus on right great toe Services and Therapies nkle Brachial Index (ABI) - Bilateral ABI/TBI A Electronic Signature(s) Signed: 05/02/2023 4:17:39 PM By: Angelina Pih Signed: 05/02/2023 4:20:01 PM By: Allen Derry PA-C Entered By: Angelina Pih on 05/02/2023 09:30:57 Natalie [name] ([id number]) [id number] Page 5 of 11 -------------------------------------------------------------------------------- Problem List Details Patient Name: Date of Service: Natalie [name] Natalie [name]. 05/02/2023 8:15 A M Medical Record Number: [id number] Patient Account Number: [id number] Date of Birth/Sex: Treating RN: 1932/06/26 (89 y.o. Natalie [name] Primary Care Provider: Willow Ora Other Clinician: Referring Provider: Treating Provider/Extender: Waylan Boga in Treatment: 0 Active Problems ICD-10 Encounter Code Description Active Date MDM Diagnosis E11.621 Type 2 diabetes mellitus with foot ulcer 05/02/2023 No Yes L97.512 Non-pressure chronic ulcer of other part of right foot with fat layer exposed 05/02/2023 No Yes I10 Essential (primary) hypertension 05/02/2023 No Yes Inactive Problems Resolved Problems Electronic Signature(s) Signed: 05/02/2023 9:08:58 AM By: Allen Derry PA-C Entered By:  Allen Derry on 05/02/2023 09:08:58 -------------------------------------------------------------------------------- Progress Note Details Patient Name: Date of Service: Natalie [name], Natalie Natalie [name]. 05/02/2023 8:15 A M Medical Record Number: [id number] Patient Account Number: [id number] Date of Birth/Sex: Treating RN: 10-20-1932 (89 y.o. Natalie [name] Primary Care Provider: Willow Ora Other Clinician: Referring Provider: Treating Provider/Extender: Waylan Boga in Treatment: 0 Subjective Chief Complaint Information obtained from Patient Natalie [name], Natalie ([id number]) [id number] Page 6 of 11 Right great toe ulcer History of Present Illness (HPI) 05-02-2023 upon evaluation today patient presents for initial evaluation in the clinic  although this is an individual whom I am seeing previously when I was working with pain management although that has been about 8 years ago. Fortunately I do not see any signs of active infection at this time which is great news. With that being said this wound has been present for about 4 months. She is not sure if she had an ingrown toenail that she was clipping in that area when something happened or not it has honestly been so far removed she is unsure. She has been seeing podiatry at [facility] and subsequently they have been treating this although she tells me that most recently they have been using mupirocin and they have been trying to keep her from getting this wet. Subsequently she has been on doxycycline though I do not believe she is on anything right now she tells me this has been infected a couple times with the course of treatment thus far. Her most notable change that she is seeing is when she is wearing the offloading postop shoe this seems to be better for her than using just a regular shoe which is when it reoccurred when she thought she had it healed although I think this may have just been  more of a callusing over. I am unsure however as I was not involved in her care during that time. Podiatry has ordered new custom shoes but they are not in yet. Patient does have a history of diabetes mellitus type 2 most recent hemoglobin A1c on 02-21-2023 was 6.1. She also tells me that she has a history with hypertension this seems to be very well-controlled for the most part. Otherwise no major medical problems in general. Patient History Information obtained from Patient, Chart. Allergies cefixime, cefprozil, cefuroxime, nitrofurantoin, penicillin, Sulfa (Sulfonamide Antibiotics) Social History Never smoker, Alcohol Use - Never, Drug Use - No History, Caffeine Use - Moderate. Medical History Ear/Nose/Mouth/Throat Patient has history of Chronic sinus problems/congestion - uses nasal spray Respiratory Patient has history of Asthma Cardiovascular Patient has history of Hypertension Endocrine Patient has history of Type II Diabetes Patient is treated with Oral Agents. Blood sugar is tested. Medical A Surgical History Notes nd Cardiovascular murmur Review of Systems (ROS) Constitutional Symptoms (General Health) Denies complaints or symptoms of Fatigue, Fever, Chills, Marked Weight Change. Eyes Complains or has symptoms of Glasses / Contacts. Hematologic/Lymphatic Denies complaints or symptoms of Bleeding / Clotting Disorders, Human Immunodeficiency Virus. Gastrointestinal Denies complaints or symptoms of Frequent diarrhea, Nausea, Vomiting. Endocrine Complains or has symptoms of Thyroid disease - pt states spot removed in thyroid previously. Genitourinary Denies complaints or symptoms of Kidney failure/ Dialysis, Incontinence/dribbling. Immunological Denies complaints or symptoms of Hives, Itching. Integumentary (Skin) Denies complaints or symptoms of Wounds, Bleeding or bruising tendency, Breakdown, Swelling. Musculoskeletal Denies complaints or symptoms of Muscle Pain,  Muscle Weakness. Neurologic Denies complaints or symptoms of Numbness/parasthesias, Focal/Weakness. 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 Objective Constitutional sitting or standing blood pressure is within target range for patient.. pulse regular and within target range for patient.Marland Kitchen respirations regular, non-labored and within target range for patient.Marland Kitchen temperature within target range for patient.. Well-nourished and well-hydrated in no acute distress. Vitals Time Taken: 8:31 AM, Height: 66 in, Source: Stated, Weight: 176 lbs, Source: Stated, BMI: 28.4, Temperature: 97.6 F, Pulse: 85 bpm, Respiratory Rate: 18 breaths/min, Blood Pressure: 127/76 mmHg. Eyes conjunctiva clear no eyelid edema noted. pupils equal round and reactive to light and accommodation. Jarrett Ables [name] (  [id number]) [id number] Page 7 of 11 Ears, Nose, Mouth, and Throat no gross abnormality of ear auricles or external auditory canals. normal hearing noted during conversation. mucus membranes moist. Respiratory normal breathing without difficulty. Cardiovascular 1+ dorsalis pedis/posterior tibialis pulses. no clubbing, cyanosis, significant edema, Musculoskeletal normal gait and posture. no significant deformity or arthritic changes, no loss or range of motion, no clubbing. Psychiatric this patient is able to make decisions and demonstrates good insight into disease process. Alert and Oriented x 3. pleasant and cooperative. General Notes: Upon inspection patient's wound bed actually did show some signs of callus as well as slough and biofilm buildup in the surface of the wound. I discussed with her that I do believe that this is gena be something that we need to clean away I explained how all debridement will subsequently stimulate her as far as healing is concerned and can improve things overall. I think that  this is gena be one of the biggest things that we need to do her ABI was 0.8 I do want to make sure that her arterial flow is good although I suspect it is probably okay at the same time her pulses were somewhat faint and 1+ bilaterally at dorsalis pedis and posterior tibial I would like to get a full reading on this with arterial study with ABI and TBI. Integumentary (Hair, Skin) Wound #1 status is Open. Original cause of wound was Gradually Appeared. The date acquired was: 12/15/2022. The wound is located on the Right [name] Great. oe The wound measures 0.5cm length x 0.3cm width x 0.4cm depth; 0.118cm^2 area and 0.047cm^3 volume. There is Fat Layer (Subcutaneous Tissue) exposed. There is no tunneling or undermining noted. There is a medium amount of serosanguineous drainage noted. There is small (1-33%) red, pink granulation within the wound bed. There is a large (67-100%) amount of necrotic tissue within the wound bed. Assessment Active Problems ICD-10 Type 2 diabetes mellitus with foot ulcer Non-pressure chronic ulcer of other part of right foot with fat layer exposed Essential (primary) hypertension Procedures Wound #1 Pre-procedure diagnosis of Wound #1 is a Diabetic Wound/Ulcer of the Lower Extremity located on the Right [name] Great .Severity of Tissue Pre Debridement is: oe Fat layer exposed. There was a Excisional Skin/Subcutaneous Tissue Debridement with a total area of 0.12 sq cm performed by Allen Derry, PA-C. With the following instrument(s): Curette to remove Viable and Non-Viable tissue/material. Material removed includes Callus, Subcutaneous Tissue, and Slough after achieving pain control using Lidocaine 4% Topical Solution. No specimens were taken. A time out was conducted at 09:22, prior to the start of the procedure. A Moderate amount of bleeding was controlled with Pressure. The procedure was tolerated well. Post Debridement Measurements: 0.5cm length x 0.3cm width x 0.4cm depth;  0.047cm^3 volume. Character of Wound/Ulcer Post Debridement is stable. Severity of Tissue Post Debridement is: Fat layer exposed. Post procedure Diagnosis Wound #1: Same as Pre-Procedure Plan Follow-up Appointments: Return Appointment in 1 week. Bathing/ Shower/ Hygiene: May shower; gently cleanse wound with antibacterial soap, rinse and pat dry prior to dressing wounds - Shower on days you change the dressing, take your shower with dressing on, then at end of shower take dressing off, clean with Dial original GOLD soap, dry and apply a new dressing. May shower with wound dressing protected with water repellent cover or cast protector. - If showering on days you are not changing dressing, you will need to protect area from becoming wet. No tub bath. Anesthetic (Use 'Patient Medications'  Section for Anesthetic Order Entry): Lidocaine applied to wound bed Edema Control - Orders / Instructions: Elevate, Exercise Daily and Avoid Standing for Long Periods of Time. Elevate legs to the level of the heart and pump ankles as often as possible Elevate leg(s) parallel to the floor when sitting. DO YOUR BEST to sleep in the bed at night. DO NOT sleep in your recliner. Long hours of sitting in a recliner leads to swelling of the legs and/or potential wounds on your backside. Services and Therapies ordered were: Ankle Brachial Index (ABI) - Bilateral ABI/TBI Radiology ordered were: X-ray, foot - focus on right great toe WOUND #1: - [name] Great Wound Laterality: Right oe Cleanser: Vashe 5.8 (oz) 3 x Per Week/15 Days Discharge Instructions: Use vashe 5.8 (oz) as directed Cleanser: Wound Cleanser 3 x Per Week/15 Days Discharge Instructions: Wash your hands with soap and water. Remove old dressing, discard into plastic bag and place into trash. Cleanse the Natalie [name], Natalie [name] ([id number]) [id number] Page 8 of 11 wound with Wound Cleanser prior to applying a clean dressing using  gauze sponges, not tissues or cotton balls. Do not scrub or use excessive force. Pat dry using gauze sponges, not tissue or cotton balls. Prim Dressing: Endoform Natural, Non-fenestrated, 2x2 (in/in) 3 x Per Week/15 Days ary Secondary Dressing: Conforming Guaze Roll-Small 3 x Per Week/15 Days Discharge Instructions: Apply Conforming Stretch Guaze Bandage as directed Secondary Dressing: Gauze 3 x Per Week/15 Days Discharge Instructions: As directed: dry Secured With: Conform 2''- Conforming Stretch Gauze Bandage 2x75 (in/in) 3 x Per Week/15 Days Discharge Instructions: Apply as directed 1. I am going to go and send the patient for an arterial study with ABI and TBI. 2. Also get a go ahead and order an x-ray as it does not appear this has been done as best I can tell anyway anywhere in the system. 3. I am also going to recommend that we go ahead and use endoform for the wound I think this is can be our best option there. 4. I am also going to suggest that we utilize the gauze to cover. 5. I am also going to have her continue with the postop shoe I think this is probably the best thing to do especially until she gets her custom shoes. We will see patient back for reevaluation in 1 week here in the clinic. If anything worsens or changes patient will contact our office for additional recommendations. Electronic Signature(s) Signed: 05/02/2023 10:16:39 AM By: Allen Derry PA-C Entered By: Allen Derry on 05/02/2023 10:16:38 -------------------------------------------------------------------------------- ROS/PFSH Details Patient Name: Date of Service: Natalie [name], Natalie Natalie [name]. 05/02/2023 8:15 A M Medical Record Number: [id number] Patient Account Number: [id number] Date of Birth/Sex: Treating RN: 1933-03-22 (89 y.o. Natalie [name] Primary Care Provider: Willow Ora Other Clinician: Referring Provider: Treating Provider/Extender: Waylan Boga in Treatment: 0 Information  Obtained From Patient Chart Constitutional Symptoms (General Health) Complaints and Symptoms: Negative for: Fatigue; Fever; Chills; Marked Weight Change Eyes Complaints and Symptoms: Positive for: Glasses / Contacts Hematologic/Lymphatic Complaints and Symptoms: Negative for: Bleeding / Clotting Disorders; Human Immunodeficiency Virus Gastrointestinal Complaints and Symptoms: Negative for: Frequent diarrhea; Nausea; Vomiting Endocrine Complaints and Symptoms: Positive for: Thyroid disease - pt states spot removed in thyroid previously Medical History: Positive for: Type II Diabetes Natalie [name], Natalie [name] ([id number]) [id number] Page 9 of 11 Time with diabetes: 10 years Treated with: Oral agents Blood sugar tested every day: Yes Tested : Genitourinary Complaints and Symptoms: Negative for: Kidney  failure/ Dialysis; Incontinence/dribbling Immunological Complaints and Symptoms: Negative for: Hives; Itching Integumentary (Skin) Complaints and Symptoms: Negative for: Wounds; Bleeding or bruising tendency; Breakdown; Swelling Musculoskeletal Complaints and Symptoms: Negative for: Muscle Pain; Muscle Weakness Neurologic Complaints and Symptoms: Negative for: Numbness/parasthesias; Focal/Weakness Psychiatric Complaints and Symptoms: Negative for: Anxiety; Claustrophobia Ear/Nose/Mouth/Throat Medical History: Positive for: Chronic sinus problems/congestion - uses nasal spray Respiratory Medical History: Positive for: Asthma Cardiovascular Medical History: Positive for: Hypertension Past Medical History Notes: murmur Oncologic Complaints and Symptoms: Review of System Notes: skin CA, 1989 precancerous lobectomy HBO Extended History Items Ear/Nose/Mouth/Throat: Chronic sinus problems/congestion Immunizations Pneumococcal Vaccine: Received Pneumococcal Vaccination: Yes Received Pneumococcal Vaccination On or After 60th Birthday:  Yes Implantable Devices None Family and Social History Never smoker; Alcohol Use: Never; Drug Use: No History; Caffeine Use: Moderate Social Determinants of Health (SDOH) 1. In the past 2 months, did you or others you live with eat smaller meals or skip meals because you didn'[name] have money for foodo : No 2. Are you homeless or worried that you might be in the futureo : No 3. Do you have trouble paying for your utilities (gas, electricity, phone)o : No Natalie [name], Natalie [name] ([id number]) [id number] Page 10 of 11 4. Do you have trouble finding or paying for a rideo : No 5. Do you need daycare, or better daycare, for your kidso : No 6. Are you unemployed or without regular incomeo : No 7. Do you need help finding a better jobo : No 8. Do you need help getting more educationo : No 9. Are you concerned about someone in your home using drugs or alcoholo : No 10. Do you feel unsafe in your daily lifeo : No 11. Is anyone in your home threatening or abusing youo : No 12. Do you lack quality relationships that make you feel valued and supportedo : No 13. Do you need help getting cultural information in a language you understando : No 14. Do you need help getting internet accesso : No Advanced Directives and Instructions Electronic Signature(s) Signed: 05/02/2023 4:17:39 PM By: Angelina Pih Signed: 05/02/2023 4:20:01 PM By: Allen Derry PA-C Entered By: Angelina Pih on 05/02/2023 09:14:54 -------------------------------------------------------------------------------- SuperBill Details Patient Name: Date of Service: Natalie [name], Natalie Natalie [name]. 05/02/2023 Medical Record Number: [id number] Patient Account Number: [id number] Date of Birth/Sex: Treating RN: May 30, 1932 (89 y.o. Natalie [name] Primary Care Provider: Willow Ora Other Clinician: Referring Provider: Treating Provider/Extender: Waylan Boga in Treatment: 0 Diagnosis Coding ICD-10  Codes Code Description E11.621 Type 2 diabetes mellitus with foot ulcer L97.512 Non-pressure chronic ulcer of other part of right foot with fat layer exposed I10 Essential (primary) hypertension Facility Procedures : CPT4 Code: 13086578 Description: 99213 - WOUND CARE VISIT-LEV 3 EST PT Modifier: Quantity: 1 : CPT4 Code: 46962952 Description: 11042 - DEB SUBQ TISSUE 20 SQ CM/< ICD-10 Diagnosis Description L97.512 Non-pressure chronic ulcer of other part of right foot with fat layer exposed Modifier: Quantity: 1 Physician Procedures : CPT4 Code Description Modifier [id number] 99204 - WC PHYS LEVEL 4 - NEW PT 25 ICD-10 Diagnosis Description E11.621 Type 2 diabetes mellitus with foot ulcer L97.512 Non-pressure chronic ulcer of other part of right foot with fat layer exposed I10 Essential  (primary) hypertension Quantity: 1 Electronic Signature(s) Signed: 05/02/2023 10:25:06 AM By: Angelina Pih Signed: 05/02/2023 4:20:01 PM By: Allen Derry PA-C Previous Signature: 05/02/2023 10:16:56 AM Version By: Allen Derry PA-C Entered By: Angelina Pih on 05/02/2023 10:25:06

## 2023-05-04 ENCOUNTER — Encounter (HOSPITAL_COMMUNITY): Payer: Medicare PPO

## 2023-05-05 ENCOUNTER — Other Ambulatory Visit (HOSPITAL_COMMUNITY): Payer: Self-pay | Admitting: Physician Assistant

## 2023-05-05 ENCOUNTER — Ambulatory Visit (INDEPENDENT_AMBULATORY_CARE_PROVIDER_SITE_OTHER)
Admission: RE | Admit: 2023-05-05 | Discharge: 2023-05-05 | Disposition: A | Discharge status: Home / Self Care | Payer: Medicare PPO | Source: Ambulatory Visit | Attending: Vascular Surgery | Admitting: Vascular Surgery

## 2023-05-05 ENCOUNTER — Ambulatory Visit (HOSPITAL_COMMUNITY)
Admission: RE | Admit: 2023-05-05 | Discharge: 2023-05-05 | Disposition: A | Discharge status: Home / Self Care | Payer: Medicare PPO | Source: Ambulatory Visit | Attending: Vascular Surgery | Admitting: Vascular Surgery

## 2023-05-05 DIAGNOSIS — L97509 Non-pressure chronic ulcer of other part of unspecified foot with unspecified severity: Secondary | ICD-10-CM | POA: Insufficient documentation

## 2023-05-05 DIAGNOSIS — L97512 Non-pressure chronic ulcer of other part of right foot with fat layer exposed: Secondary | ICD-10-CM

## 2023-05-05 DIAGNOSIS — E11621 Type 2 diabetes mellitus with foot ulcer: Secondary | ICD-10-CM

## 2023-05-05 LAB — VAS US ABI WITH/WO TBI
Left ABI: 0.96
Right ABI: 0.91

## 2023-05-09 ENCOUNTER — Encounter: Payer: Medicare PPO | Admitting: Physician Assistant

## 2023-05-09 DIAGNOSIS — L97512 Non-pressure chronic ulcer of other part of right foot with fat layer exposed: Secondary | ICD-10-CM | POA: Diagnosis not present

## 2023-05-09 DIAGNOSIS — I10 Essential (primary) hypertension: Secondary | ICD-10-CM | POA: Diagnosis not present

## 2023-05-09 DIAGNOSIS — E11621 Type 2 diabetes mellitus with foot ulcer: Secondary | ICD-10-CM | POA: Diagnosis not present

## 2023-05-09 NOTE — Progress Notes (Signed)
Natalie [name], Natalie [name] ([id number]) [id number] Page 1 of 9 Visit Report for 05/09/2023 Arrival Information Details Patient Name: Date of Service: Natalie [name]. 05/09/2023 3:00 PM Medical Record Number: [id number] Patient Account Number: [id number] Date of Birth/Sex: Treating RN: Sep 01, 1932 (89 y.o. Natalie [name] Anitha Kreiser: Willow Ora Other Clinician: Referring Kristeena Meineke: Treating Erlean Mealor/Extender: Oneita Kras Weeks in Treatment: 1 Visit Information History Since Last Visit Added or deleted any medications: No Patient Arrived: Dan Humphreys Any new allergies or adverse reactions: No Arrival Time: 15:11 Had a fall or experienced change in No Accompanied By: self activities of daily living that may affect Transfer Assistance: None risk of falls: Patient Identification Verified: Yes Hospitalized since last visit: No Secondary Verification Process Completed: Yes Has Dressing in Place as Prescribed: Yes Patient Has Alerts: Yes Pain Present Now: No Patient Alerts: Type 2 diabetic Electronic Signature(s) Signed: 05/09/2023 4:24:28 PM By: Angelina Pih Entered By: Angelina Pih on 05/09/2023 15:11:56 -------------------------------------------------------------------------------- Clinic Level of [name] Assessment Details Patient Name: Date of Service: Natalie [name]. 05/09/2023 3:00 PM Medical Record Number: [id number] Patient Account Number: [id number] Date of Birth/Sex: Treating RN: January 14, 1933 (89 y.o. Natalie [name] Zavian Slowey: Willow Ora Other Clinician: Referring Skylor Hughson: Treating Chrystine Frogge/Extender: Oneita Kras Weeks in Treatment: 1 Clinic Level of [name] Assessment Items TOOL 1 Quantity Score []  - 0 Use when EandM and Procedure is performed on INITIAL visit ASSESSMENTS - Nursing Assessment / Reassessment []  - 0 General Physical Exam (combine w/ comprehensive assessment (listed just below) when  performed on new pt. evals) []  - 0 Comprehensive Assessment (HX, ROS, Risk Assessments, Wounds Hx, etc.) ASSESSMENTS - Wound and Skin Assessment / Reassessment []  - 0 Dermatologic / Skin Assessment (not related to wound area) ASSESSMENTS - Ostomy and/or Continence Assessment and [name] Natalie [name] ([id number]) [id number] Page 2 of 9 []  - 0 Incontinence Assessment and Management []  - 0 Ostomy [name] Assessment and Management (repouching, etc.) PROCESS - Coordination of [name] []  - 0 Simple Patient / Family Education for ongoing [name] []  - 0 Complex (extensive) Patient / Family Education for ongoing [name] []  - 0 Staff obtains Chiropractor, Records, [name] Results / Process Orders est []  - 0 Staff telephones HHA, Nursing Homes / Clarify orders / etc []  - 0 Routine Transfer to another Facility (non-emergent condition) []  - 0 Routine Hospital Admission (non-emergent condition) []  - 0 New Admissions / Manufacturing engineer / Ordering NPWT Apligraf, etc. , []  - 0 Emergency Hospital Admission (emergent condition) PROCESS - Special Needs []  - 0 Pediatric / Minor Patient Management []  - 0 Isolation Patient Management []  - 0 Hearing / Language / Visual special needs []  - 0 Assessment of Community assistance (transportation, D/C planning, etc.) []  - 0 Additional assistance / Altered mentation []  - 0 Support Surface(s) Assessment (bed, cushion, seat, etc.) INTERVENTIONS - Miscellaneous []  - 0 External ear exam []  - 0 Patient Transfer (multiple staff / Nurse, adult / Similar devices) []  - 0 Simple Staple / Suture removal (25 or less) []  - 0 Complex Staple / Suture removal (26 or more) []  - 0 Hypo/Hyperglycemic Management (do not check if billed separately) []  - 0 Ankle / Brachial Index (ABI) - do not check if billed separately Has the patient been seen at the hospital within the last three years: Yes Total Score: 0 Level Of [name]: ____ Electronic  Signature(s) Signed: 05/09/2023 4:24:28 PM By: Angelina Pih Entered By: Angelina Pih on 05/09/2023 16:19:19 -------------------------------------------------------------------------------- Encounter  Discharge Information Details Patient Name: Date of Service: Natalie [name]. 05/09/2023 3:00 PM Medical Record Number: [id number] Patient Account Number: [id number] Date of Birth/Sex: Treating RN: 1932-05-28 (89 y.o. Natalie [name] Kasey Ewings: Willow Ora Other Clinician: Referring Myda Detwiler: Treating Nasra Counce/Extender: Oneita Kras Weeks in Treatment: 1 Encounter Discharge Information Items Post Procedure Vitals Discharge Condition: Stable Temperature (F): 98.1 Natalie [name], Natalie [name] ([id number]) [id number] Page 3 of 9 Ambulatory Status: Walker Pulse (bpm): 85 Discharge Destination: Home Respiratory Rate (breaths/min): 18 Transportation: Private Auto Blood Pressure (mmHg): 136/66 Accompanied By: family Schedule Follow-up Appointment: Yes Clinical Summary of [name]: Electronic Signature(s) Signed: 05/09/2023 4:21:07 PM By: Angelina Pih Entered By: Angelina Pih on 05/09/2023 16:21:07 -------------------------------------------------------------------------------- Lower Extremity Assessment Details Patient Name: Date of Service: Natalie [name]. 05/09/2023 3:00 PM Medical Record Number: [id number] Patient Account Number: [id number] Date of Birth/Sex: Treating RN: 09-Oct-1932 (89 y.o. Natalie [name] Donnetta Gillin: Willow Ora Other Clinician: Referring Jaryd Drew: Treating Araminta Zorn/Extender: Oneita Kras Weeks in Treatment: 1 Edema Assessment Assessed: [Left: No] [Right: No] Edema: [Left: Ye] [Right: s] Calf Left: Right: Point of Measurement: 35 cm From Medial Instep 38.4 cm Ankle Left: Right: Point of Measurement: 9 cm From Medial Instep 27.5 cm Vascular Assessment Pulses: Dorsalis  Pedis Palpable: [Right:Yes] Extremity colors, hair growth, and conditions: Extremity Color: [Right:Normal] Hair Growth on Extremity: [Right:No] Temperature of Extremity: [Right:Warm < 3 seconds] Toe Nail Assessment Left: Right: Thick: Yes Discolored: Yes Deformed: No Improper Length and Hygiene: No Electronic Signature(s) Signed: 05/09/2023 4:24:28 PM By: Angelina Pih Entered By: Angelina Pih on 05/09/2023 15:20:29 [name] ([id number]) [id number] Page 4 of 9 -------------------------------------------------------------------------------- Multi Wound Chart Details Patient Name: Date of Service: Natalie [name]. 05/09/2023 3:00 PM Medical Record Number: [id number] Patient Account Number: [id number] Date of Birth/Sex: Treating RN: [birth year] (89 y.o. Natalie [name] Yaniv Lage: Willow Ora Other Clinician: Referring Layth Cerezo: Treating Monterrio Gerst/Extender: Oneita Kras Weeks in Treatment: 1 Vital Signs Height(in): 66 Pulse(bpm): 85 Weight(lbs): 176 Blood Pressure(mmHg): 136/66 Body Mass Index(BMI): 28.4 Temperature(F): 98.1 Respiratory Rate(breaths/min): 18 [1:Photos:] [N/A:N/A] Right [name] Great oe N/A N/A Wound Location: Gradually Appeared N/A N/A Wounding Event: Diabetic Wound/Ulcer of the Lower N/A N/A Primary Etiology: Extremity Chronic sinus problems/congestion, N/A N/A Comorbid History: Asthma, Hypertension, Type II Diabetes 12/15/2022 N/A N/A Date Acquired: 1 N/A N/A Weeks of Treatment: Open N/A N/A Wound Status: No N/A N/A Wound Recurrence: 0.4x0.5x0.3 N/A N/A Measurements L x W x D (cm) 0.157 N/A N/A A (cm) : rea 0.047 N/A N/A Volume (cm) : -33.10% N/A N/A % Reduction in A rea: 0.00% N/A N/A % Reduction in Volume: Grade 2 N/A N/A Classification: Medium N/A N/A Exudate A mount: Serosanguineous N/A N/A Exudate Type: red, brown N/A N/A Exudate Color: Medium (34-66%) N/A  N/A Granulation A mount: Red, Pink N/A N/A Granulation Quality: Medium (34-66%) N/A N/A Necrotic A mount: Fat Layer (Subcutaneous Tissue): Yes N/A N/A Exposed Structures: None N/A N/A Epithelialization: Treatment Notes Electronic Signature(s) Signed: 05/09/2023 4:24:28 PM By: Angelina Pih Entered By: Angelina Pih on 05/09/2023 15:34:52 [name] ([id number]) [id number] Page 5 of 9 -------------------------------------------------------------------------------- Multi-Disciplinary [name] Plan Details Patient Name: Date of Service: Natalie [name]. 05/09/2023 3:00 PM Medical Record Number: [id number] Patient Account Number: [id number] Date of Birth/Sex: Treating RN: 1932-12-04 (89 y.o. Natalie [name] Lusine Corlett: Willow Ora Other Clinician: Referring Keshauna Degraffenreid: Treating Johnathon Mittal/Extender: Oneita Kras Weeks in Treatment: 1 Active  Inactive Necrotic Tissue Nursing Diagnoses: Impaired tissue integrity related to necrotic/devitalized tissue Knowledge deficit related to management of necrotic/devitalized tissue Goals: Necrotic/devitalized tissue will be minimized in the wound bed Date Initiated: 05/02/2023 Target Resolution Date: 06/27/2023 Goal Status: Active Patient/caregiver will verbalize understanding of reason and process for debridement of necrotic tissue Date Initiated: 05/02/2023 Date Inactivated: 05/02/2023 Target Resolution Date: 05/02/2023 Goal Status: Met Interventions: Assess patient pain level pre-, during and post procedure and prior to discharge Provide education on necrotic tissue and debridement process Treatment Activities: Apply topical anesthetic as ordered : 05/02/2023 Excisional debridement : 05/02/2023 Notes: Wound/Skin Impairment Nursing Diagnoses: Impaired tissue integrity Goals: Ulcer/skin breakdown will have a volume reduction of 30% by week 4 Date Initiated: 05/02/2023 Target  Resolution Date: 05/30/2023 Goal Status: Active Ulcer/skin breakdown will have a volume reduction of 50% by week 8 Date Initiated: 05/02/2023 Target Resolution Date: 06/27/2023 Goal Status: Active Ulcer/skin breakdown will have a volume reduction of 80% by week 12 Date Initiated: 05/02/2023 Target Resolution Date: 07/25/2023 Goal Status: Active Ulcer/skin breakdown will heal within 14 weeks Date Initiated: 05/02/2023 Target Resolution Date: 08/08/2023 Goal Status: Active Interventions: Assess patient/caregiver ability to obtain necessary supplies Assess patient/caregiver ability to perform ulcer/skin [name] regimen upon admission and as needed Assess ulceration(s) every visit Provide education on ulcer and skin [name] Notes: Natalie [name], Natalie [name] ([id number]) [phone].pdf Page 6 of 9 Electronic Signature(s) Signed: 05/09/2023 4:19:33 PM By: Angelina Pih Entered By: Angelina Pih on 05/09/2023 16:19:32 -------------------------------------------------------------------------------- Pain Assessment Details Patient Name: Date of Service: Natalie [name]. 05/09/2023 3:00 PM Medical Record Number: [id number] Patient Account Number: [id number] Date of Birth/Sex: Treating RN: 1932/07/16 (89 y.o. Natalie [name] Nyilah Kight: Willow Ora Other Clinician: Referring Fartun Paradiso: Treating Damar Petit/Extender: Oneita Kras Weeks in Treatment: 1 Active Problems Location of Pain Severity and Description of Pain Patient Has Paino No Site Locations Pain Management and Medication Current Pain Management: Electronic Signature(s) Signed: 05/09/2023 4:24:28 PM By: Angelina Pih Entered By: Angelina Pih on 05/09/2023 15:12:39 -------------------------------------------------------------------------------- Patient/Caregiver Education Details Patient Name: Date of Service: Natalie [name], Natalie NNIE [name]. 12/23/2024andnbsp3:00 PM [name] ([id number])  [id number] Page 7 of 9 Medical Record Number: [id number] Patient Account Number: [id number] Date of Birth/Gender: Treating RN: 01/07/1933 (89 y.o. Natalie [name] Physician: Willow Ora Other Clinician: Referring Physician: Treating Physician/Extender: Porfirio Oar in Treatment: 1 Education Assessment Education Provided To: Patient Education Topics Provided Wound Debridement: Handouts: Wound Debridement Methods: Explain/Verbal Responses: State content correctly Wound/Skin Impairment: Handouts: Caring for Your Ulcer Methods: Explain/Verbal Responses: State content correctly Electronic Signature(s) Signed: 05/09/2023 4:24:28 PM By: Angelina Pih Entered By: Angelina Pih on 05/09/2023 16:19:50 -------------------------------------------------------------------------------- Wound Assessment Details Patient Name: Date of Service: Natalie [name], Natalie NNIE [name]. 05/09/2023 3:00 PM Medical Record Number: [id number] Patient Account Number: [id number] Date of Birth/Sex: Treating RN: [birth year] (89 y.o. Philbert Riser, Luther Parody Primary [name] Laszlo Ellerby: Willow Ora Other Clinician: Referring Lavell Supple: Treating Tiphanie Vo/Extender: Oneita Kras Weeks in Treatment: 1 Wound Status Wound Number: 1 Primary Diabetic Wound/Ulcer of the Lower Extremity Etiology: Wound Location: Right [name] Great oe Wound Status: Open Wounding Event: Gradually Appeared Comorbid Chronic sinus problems/congestion, Asthma, Hypertension, Date Acquired: 12/15/2022 History: Type II Diabetes Weeks Of Treatment: 1 Clustered Wound: No Photos Wound Measurements Natalie [name], Natalie [name] ([id number]) Length: (cm) 0.4 Width: (cm) 0.5 Depth: (cm) 0.3 Area: (cm) 0.157 Volume: (cm) 0.047 [id number] Page 8 of 9 % Reduction in Area: -33.1% % Reduction in Volume: 0% Epithelialization: None Tunneling:  No Undermining: No Wound  Description Classification: Grade 2 Exudate Amount: Medium Exudate Type: Serosanguineous Exudate Color: red, brown Foul Odor After Cleansing: No Slough/Fibrino Yes Wound Bed Granulation Amount: Medium (34-66%) Exposed Structure Granulation Quality: Red, Pink Fat Layer (Subcutaneous Tissue) Exposed: Yes Necrotic Amount: Medium (34-66%) Treatment Notes Wound #1 (Toe Great) Wound Laterality: Right Cleanser Vashe 5.8 (oz) Discharge Instruction: Use vashe 5.8 (oz) as directed Wound Cleanser Discharge Instruction: Wash your hands with soap and water. Remove old dressing, discard into plastic bag and place into trash. Cleanse the wound with Wound Cleanser prior to applying a clean dressing using gauze sponges, not tissues or cotton balls. Do not scrub or use excessive force. Pat dry using gauze sponges, not tissue or cotton balls. Peri-Wound [name] Topical Primary Dressing Prisma 4.34 (in) Discharge Instruction: Moisten w/normal saline or sterile water; Cover wound as directed. Do not remove from wound bed. Xeroform-HBD 2x2 (in/in) Discharge Instruction: Apply Xeroform-HBD 2x2 (in/in) as directed Secondary Dressing Gauze Discharge Instruction: As directed: dry Secured With Conform 2''- Conforming Stretch Gauze Bandage 2x75 (in/in) Discharge Instruction: Apply as directed Compression Wrap Compression Stockings Add-Ons Electronic Signature(s) Signed: 05/09/2023 4:24:28 PM By: Angelina Pih Entered By: Angelina Pih on 05/09/2023 15:19:48 -------------------------------------------------------------------------------- Vitals Details Patient Name: Date of Service: Natalie [name], Natalie NNIE [name]. 05/09/2023 3:00 PM Medical Record Number: [id number] Patient Account Number: [id number] Natalie [name], Natalie [name] ([id number]) [id number] Page 9 of 9 Date of Birth/Sex: Treating RN: 23-Aug-1932 (89 y.o. Natalie [name] Zabdiel Dripps: Other Clinician: Willow Ora Referring Shanta Dorvil: Treating Ankit Degregorio/Extender: Oneita Kras Weeks in Treatment: 1 Vital Signs Time Taken: 15:12 Temperature (F): 98.1 Height (in): 66 Pulse (bpm): 85 Weight (lbs): 176 Respiratory Rate (breaths/min): 18 Body Mass Index (BMI): 28.4 Blood Pressure (mmHg): 136/66 Reference Range: 80 - 120 mg / dl Electronic Signature(s) Signed: 05/09/2023 4:24:28 PM By: Angelina Pih Entered By: Angelina Pih on 05/09/2023 15:12:33

## 2023-05-09 NOTE — Progress Notes (Signed)
DOLLICIA, ROHMAN T ([id number]) [id number] Page 1 of 7 Visit Report for 05/09/2023 Chief Complaint Document Details Patient Name: Date of Service: Natalie [name] Natalie T. 05/09/2023 3:00 PM Medical Record Number: [id number] Patient Account Number: [id number] Date of Birth/Sex: Treating RN: 1933/04/10 (89 y.o. Natalie [name] Primary Care Provider: Willow Ora Other Clinician: Referring Provider: Treating Provider/Extender: Oneita Kras Weeks in Treatment: 1 Information Obtained from: Patient Chief Complaint Right great toe ulcer Electronic Signature(s) Signed: 05/09/2023 3:07:59 PM By: Allen Derry PA-C Entered By: Allen Derry on 05/09/2023 15:07:59 -------------------------------------------------------------------------------- Debridement Details Patient Name: Date of Service: Natalie [name], FA Natalie T. 05/09/2023 3:00 PM Medical Record Number: [id number] Patient Account Number: [id number] Date of Birth/Sex: Treating RN: 03/23/1933 (89 y.o. Natalie [name] Primary Care Provider: Willow Ora Other Clinician: Referring Provider: Treating Provider/Extender: Oneita Kras Weeks in Treatment: 1 Debridement Performed for Assessment: Wound #1 Right T Great oe Performed By: Physician Allen Derry, PA-C The following information was scribed by: Angelina Pih The information was scribed for: Allen Derry Debridement Type: Debridement Severity of Tissue Pre Debridement: Fat layer exposed Level of Consciousness (Pre-procedure): Awake and Alert Pre-procedure Verification/Time Out Yes - 15:37 Taken: Pain Control: Lidocaine 4% T opical Solution Percent of Wound Bed Debrided: 100% T Area Debrided (cm): otal 0.16 Tissue and other material debrided: Viable, Non-Viable, Callus, Slough, Subcutaneous, Slough Level: Skin/Subcutaneous Tissue Debridement Description: Excisional Instrument: Curette Bleeding: Moderate Hemostasis Achieved: Pressure MANUELITA, PORTEE T ([id number]) [id number] Page 2 of 7 Response to Treatment: Procedure was tolerated well Level of Consciousness (Post- Awake and Alert procedure): Post Debridement Measurements of Total Wound Length: (cm) 0.4 Width: (cm) 0.5 Depth: (cm) 0.3 Volume: (cm) 0.047 Character of Wound/Ulcer Post Debridement: Stable Severity of Tissue Post Debridement: Fat layer exposed Post Procedure Diagnosis Same as Pre-procedure Electronic Signature(s) Signed: 05/09/2023 4:24:28 PM By: Angelina Pih Signed: 05/09/2023 5:11:14 PM By: Allen Derry PA-C Entered By: Angelina Pih on 05/09/2023 15:38:52 -------------------------------------------------------------------------------- HPI Details Patient Name: Date of Service: Natalie [name], FA Natalie T. 05/09/2023 3:00 PM Medical Record Number: [id number] Patient Account Number: [id number] Date of Birth/Sex: Treating RN: 1933/02/18 (89 y.o. Natalie [name] Primary Care Provider: Willow Ora Other Clinician: Referring Provider: Treating Provider/Extender: Oneita Kras Weeks in Treatment: 1 History of Present Illness HPI Description: 05-02-2023 upon evaluation today patient presents for initial evaluation in the clinic although this is an individual whom I am seeing previously when I was working with pain management although that has been about 8 years ago. Fortunately I do not see any signs of active infection at this time which is great news. With that being said this wound has been present for about 4 months. She is not sure if she had an ingrown toenail that she was clipping in that area when something happened or not it has honestly been so far removed she is unsure. She has been seeing podiatry at [facility] and subsequently they have been treating this although she tells me that most recently they have been using mupirocin and they have been trying to keep her from getting this wet. Subsequently she has  been on doxycycline though I do not believe she is on anything right now she tells me this has been infected a couple times with the course of treatment thus far. Her most notable change that she is seeing is when she is wearing the offloading postop shoe this seems to be better for her than using just a regular shoe which  is when it reoccurred when she thought she had it healed although I think this may have just been more of a callusing over. I am unsure however as I was not involved in her care during that time. Podiatry has ordered new custom shoes but they are not in yet. Patient does have a history of diabetes mellitus type 2 most recent hemoglobin A1c on 02-21-2023 was 6.1. She also tells me that she has a history with hypertension this seems to be very well-controlled for the most part. Otherwise no major medical problems in general. 05-09-2023 upon evaluation today patient actually appears to be doing better in regard to her toe to some degree. I am actually very pleased with where where we stand I think the endoform however is drying out too much and not being productive for her as far as the toe is concerned. I think we may want to switch to Prisma this may be a better option. Electronic Signature(s) Signed: 05/09/2023 4:41:27 PM By: Allen Derry PA-C Entered By: Allen Derry on 05/09/2023 16:41:27 [name] ([id number]) [id number] Page 3 of 7 -------------------------------------------------------------------------------- Physical Exam Details Patient Name: Date of Service: Natalie [name], FA Natalie T. 05/09/2023 3:00 PM Medical Record Number: [id number] Patient Account Number: [id number] Date of Birth/Sex: Treating RN: [birth year] (89 y.o. Natalie [name] Primary Care Provider: Willow Ora Other Clinician: Referring Provider: Treating Provider/Extender: Oneita Kras Weeks in Treatment: 1 Constitutional Well-nourished and well-hydrated in no  acute distress. Respiratory normal breathing without difficulty. Psychiatric this patient is able to make decisions and demonstrates good insight into disease process. Alert and Oriented x 3. pleasant and cooperative. Notes Upon inspection patient's wound bed did require sharp debridement clearway necrotic debris she tolerated that today without complication postdebridement wound bed appears to be doing significantly better which is great news. Electronic Signature(s) Signed: 05/09/2023 4:41:37 PM By: Allen Derry PA-C Entered By: Allen Derry on 05/09/2023 16:41:37 -------------------------------------------------------------------------------- Physician Orders Details Patient Name: Date of Service: Natalie [name], FA Natalie T. 05/09/2023 3:00 PM Medical Record Number: [id number] Patient Account Number: [id number] Date of Birth/Sex: Treating RN: 1933-01-06 (89 y.o. Natalie [name] Primary Care Provider: Willow Ora Other Clinician: Referring Provider: Treating Provider/Extender: Oneita Kras Weeks in Treatment: 1 The following information was scribed by: Angelina Pih The information was scribed for: Allen Derry Verbal / Phone Orders: No Diagnosis Coding ICD-10 Coding Code Description E11.621 Type 2 diabetes mellitus with foot ulcer L97.512 Non-pressure chronic ulcer of other part of right foot with fat layer exposed I10 Essential (primary) hypertension Follow-up Appointments Return Appointment in 1 week. ZANYAH, WACH T ([id number]) [id number] Page 4 of 7 Bathing/ Shower/ Hygiene May shower; gently cleanse wound with antibacterial soap, rinse and pat dry prior to dressing wounds - Shower on days you change the dressing, take your shower with dressing on, then at end of shower take dressing off, clean with Dial original GOLD soap, dry and apply a new dressing. May shower with wound dressing protected with water repellent cover or cast protector.  - If showering on days you are not changing dressing, you will need to protect area from becoming wet. No tub bath. Anesthetic (Use 'Patient Medications' Section for Anesthetic Order Entry) Lidocaine applied to wound bed Edema Control - Orders / Instructions Elevate, Exercise Daily and A void Standing for Long Periods of Time. Elevate legs to the level of the heart and pump ankles as often as possible Elevate leg(s) parallel to the floor when sitting. DO YOUR  BEST to sleep in the bed at night. DO NOT sleep in your recliner. Long hours of sitting in a recliner leads to swelling of the legs and/or potential wounds on your backside. Wound Treatment Wound #1 - T Great oe Wound Laterality: Right Cleanser: Vashe 5.8 (oz) 3 x Per Week/15 Days Discharge Instructions: Use vashe 5.8 (oz) as directed Cleanser: Wound Cleanser 3 x Per Week/15 Days Discharge Instructions: Wash your hands with soap and water. Remove old dressing, discard into plastic bag and place into trash. Cleanse the wound with Wound Cleanser prior to applying a clean dressing using gauze sponges, not tissues or cotton balls. Do not scrub or use excessive force. Pat dry using gauze sponges, not tissue or cotton balls. Prim Dressing: Prisma 4.34 (in) 3 x Per Week/15 Days ary Discharge Instructions: Moisten w/normal saline or sterile water; Cover wound as directed. Do not remove from wound bed. Prim Dressing: Xeroform-HBD 2x2 (in/in) 3 x Per Week/15 Days ary Discharge Instructions: Apply Xeroform-HBD 2x2 (in/in) as directed Secondary Dressing: Gauze 3 x Per Week/15 Days Discharge Instructions: As directed: dry Secured With: Stretch Net Dressing, Latex-free, Size 5, Small-Head / Shoulder / Thigh 3 x Per Week/15 Days Discharge Instructions: size 2 Electronic Signature(s) Signed: 05/09/2023 4:24:28 PM By: Angelina Pih Signed: 05/09/2023 5:11:14 PM By: Allen Derry PA-C Entered By: Angelina Pih on 05/09/2023  16:20:38 -------------------------------------------------------------------------------- Problem List Details Patient Name: Date of Service: Natalie [name], FA Natalie T. 05/09/2023 3:00 PM Medical Record Number: [id number] Patient Account Number: [id number] Date of Birth/Sex: Treating RN: [birth year] (89 y.o. Natalie [name] Primary Care Provider: Willow Ora Other Clinician: Referring Provider: Treating Provider/Extender: Oneita Kras Weeks in Treatment: 1 Active Problems ICD-10 Encounter Code Description Active Date MDM Diagnosis E11.621 Type 2 diabetes mellitus with foot ulcer 05/02/2023 No Yes KIKUKO, TRESNER T ([id number]) [id number] Page 5 of 7 L97.512 Non-pressure chronic ulcer of other part of right foot with fat layer exposed 05/02/2023 No Yes I10 Essential (primary) hypertension 05/02/2023 No Yes Inactive Problems Resolved Problems Electronic Signature(s) Signed: 05/09/2023 3:07:54 PM By: Allen Derry PA-C Entered By: Allen Derry on 05/09/2023 15:07:53 -------------------------------------------------------------------------------- Progress Note Details Patient Name: Date of Service: [name], FA Natalie T. 05/09/2023 3:00 PM Medical Record Number: [id number] Patient Account Number: [id number] Date of Birth/Sex: Treating RN: 06-03-1932 (89 y.o. Natalie [name] Primary Care Provider: Willow Ora Other Clinician: Referring Provider: Treating Provider/Extender: Oneita Kras Weeks in Treatment: 1 Subjective Chief Complaint Information obtained from Patient Right great toe ulcer History of Present Illness (HPI) 05-02-2023 upon evaluation today patient presents for initial evaluation in the clinic although this is an individual whom I am seeing previously when I was working with pain management although that has been about 8 years ago. Fortunately I do not see any signs of active infection at this time which is great news. With that  being said this wound has been present for about 4 months. She is not sure if she had an ingrown toenail that she was clipping in that area when something happened or not it has honestly been so far removed she is unsure. She has been seeing podiatry at [facility] and subsequently they have been treating this although she tells me that most recently they have been using mupirocin and they have been trying to keep her from getting this wet. Subsequently she has been on doxycycline though I do not believe she is on anything right now she tells me this has been infected a  couple times with the course of treatment thus far. Her most notable change that she is seeing is when she is wearing the offloading postop shoe this seems to be better for her than using just a regular shoe which is when it reoccurred when she thought she had it healed although I think this may have just been more of a callusing over. I am unsure however as I was not involved in her care during that time. Podiatry has ordered new custom shoes but they are not in yet. Patient does have a history of diabetes mellitus type 2 most recent hemoglobin A1c on 02-21-2023 was 6.1. She also tells me that she has a history with hypertension this seems to be very well-controlled for the most part. Otherwise no major medical problems in general. 05-09-2023 upon evaluation today patient actually appears to be doing better in regard to her toe to some degree. I am actually very pleased with where where we stand I think the endoform however is drying out too much and not being productive for her as far as the toe is concerned. I think we may want to switch to Prisma this may be a better option. Objective Constitutional Well-nourished and well-hydrated in no acute distress. GIZELA, MENELEY T ([id number]) [id number] Page 6 of 7 Vitals Time Taken: 3:12 PM, Height: 66 in, Weight: 176 lbs, BMI: 28.4, Temperature: 98.1 F,  Pulse: 85 bpm, Respiratory Rate: 18 breaths/min, Blood Pressure: 136/66 mmHg. Respiratory normal breathing without difficulty. Psychiatric this patient is able to make decisions and demonstrates good insight into disease process. Alert and Oriented x 3. pleasant and cooperative. General Notes: Upon inspection patient's wound bed did require sharp debridement clearway necrotic debris she tolerated that today without complication postdebridement wound bed appears to be doing significantly better which is great news. Integumentary (Hair, Skin) Wound #1 status is Open. Original cause of wound was Gradually Appeared. The date acquired was: 12/15/2022. The wound has been in treatment 1 weeks. The wound is located on the Right T Great. The wound measures 0.4cm length x 0.5cm width x 0.3cm depth; 0.157cm^2 area and 0.047cm^3 volume. There is Fat oe Layer (Subcutaneous Tissue) exposed. There is no tunneling or undermining noted. There is a medium amount of serosanguineous drainage noted. There is medium (34-66%) red, pink granulation within the wound bed. There is a medium (34-66%) amount of necrotic tissue within the wound bed. Assessment Active Problems ICD-10 Type 2 diabetes mellitus with foot ulcer Non-pressure chronic ulcer of other part of right foot with fat layer exposed Essential (primary) hypertension Procedures Wound #1 Pre-procedure diagnosis of Wound #1 is a Diabetic Wound/Ulcer of the Lower Extremity located on the Right T Great .Severity of Tissue Pre Debridement is: oe Fat layer exposed. There was a Excisional Skin/Subcutaneous Tissue Debridement with a total area of 0.16 sq cm performed by Allen Derry, PA-C. With the following instrument(s): Curette to remove Viable and Non-Viable tissue/material. Material removed includes Callus, Subcutaneous Tissue, and Slough after achieving pain control using Lidocaine 4% Topical Solution. No specimens were taken. A time out was conducted at  15:37, prior to the start of the procedure. A Moderate amount of bleeding was controlled with Pressure. The procedure was tolerated well. Post Debridement Measurements: 0.4cm length x 0.5cm width x 0.3cm depth; 0.047cm^3 volume. Character of Wound/Ulcer Post Debridement is stable. Severity of Tissue Post Debridement is: Fat layer exposed. Post procedure Diagnosis Wound #1: Same as Pre-Procedure Plan Follow-up Appointments: Return Appointment in 1 week. Bathing/ Shower/ Hygiene:  May shower; gently cleanse wound with antibacterial soap, rinse and pat dry prior to dressing wounds - Shower on days you change the dressing, take your shower with dressing on, then at end of shower take dressing off, clean with Dial original GOLD soap, dry and apply a new dressing. May shower with wound dressing protected with water repellent cover or cast protector. - If showering on days you are not changing dressing, you will need to protect area from becoming wet. No tub bath. Anesthetic (Use 'Patient Medications' Section for Anesthetic Order Entry): Lidocaine applied to wound bed Edema Control - Orders / Instructions: Elevate, Exercise Daily and Avoid Standing for Long Periods of Time. Elevate legs to the level of the heart and pump ankles as often as possible Elevate leg(s) parallel to the floor when sitting. DO YOUR BEST to sleep in the bed at night. DO NOT sleep in your recliner. Long hours of sitting in a recliner leads to swelling of the legs and/or potential wounds on your backside. WOUND #1: - T Great Wound Laterality: Right oe Cleanser: Vashe 5.8 (oz) 3 x Per Week/15 Days Discharge Instructions: Use vashe 5.8 (oz) as directed Cleanser: Wound Cleanser 3 x Per Week/15 Days Discharge Instructions: Wash your hands with soap and water. Remove old dressing, discard into plastic bag and place into trash. Cleanse the wound with Wound Cleanser prior to applying a clean dressing using gauze sponges, not  tissues or cotton balls. Do not scrub or use excessive force. Pat dry using gauze sponges, not tissue or cotton balls. Prim Dressing: Prisma 4.34 (in) 3 x Per Week/15 Days ary Discharge Instructions: Moisten w/normal saline or sterile water; Cover wound as directed. Do not remove from wound bed. Prim Dressing: Xeroform-HBD 2x2 (in/in) 3 x Per Week/15 Days ary Discharge Instructions: Apply Xeroform-HBD 2x2 (in/in) as directed Secondary Dressing: Gauze 3 x Per Week/15 Days Discharge Instructions: As directed: dry Secured With: Stretch Net Dressing, Latex-free, Size 5, Small-Head / Shoulder / Thigh 3 x Per Week/15 Days Discharge Instructions: size 2 AAIRAH, FAYSON T ([id number]) [id number] Page 7 of 7 1. I am good recommend that we have the patient continue to monitor for any signs of infection or worsening. Based on what I am seeing I do believe that her making good headway here towards closure. 2. Also can recommend patient should continue with the Xeroform gauze dressing along with the stretch not to cover. 3. I would recommend that she continue to offload is much as possible and make sure nothing is rubbing on this toe she is in agreement with that plan. We will see patient back for reevaluation in 1 week here in the clinic. If anything worsens or changes patient will contact our office for additional recommendations. Electronic Signature(s) Signed: 05/09/2023 4:42:05 PM By: Allen Derry PA-C Entered By: Allen Derry on 05/09/2023 16:42:05 -------------------------------------------------------------------------------- SuperBill Details Patient Name: Date of Service: Natalie [name], FA Natalie T. 05/09/2023 Medical Record Number: [id number] Patient Account Number: [id number] Date of Birth/Sex: Treating RN: Sep 29, 1932 (89 y.o. Natalie [name] Primary Care Provider: Willow Ora Other Clinician: Referring Provider: Treating Provider/Extender: Oneita Kras Weeks in Treatment: 1 Diagnosis Coding ICD-10 Codes Code Description [phone] Type 2 diabetes mellitus with foot ulcer L97.512 Non-pressure chronic ulcer of other part of right foot with fat layer exposed I10 Essential (primary) hypertension Facility Procedures : CPT4 Code: [id number] Description: 11042 - DEB SUBQ TISSUE 20 SQ CM/< ICD-10 Diagnosis Description L97.512 Non-pressure chronic ulcer of other part of right foot  with fat layer exposed Modifier: Quantity: 1 Physician Procedures : CPT4 Code Description Modifier 11042 11042 - WC PHYS SUBQ TISS 20 SQ CM ICD-10 Diagnosis Description L97.512 Non-pressure chronic ulcer of other part of right foot with fat layer exposed Quantity: 1 Electronic Signature(s) Signed: 05/09/2023 4:42:13 PM By: Allen Derry PA-C Entered By: Allen Derry on 05/09/2023 16:42:13

## 2023-05-12 ENCOUNTER — Other Ambulatory Visit: Payer: Self-pay | Admitting: Internal Medicine

## 2023-05-17 ENCOUNTER — Encounter: Payer: Medicare PPO | Admitting: Internal Medicine

## 2023-05-17 DIAGNOSIS — L97512 Non-pressure chronic ulcer of other part of right foot with fat layer exposed: Secondary | ICD-10-CM | POA: Diagnosis not present

## 2023-05-17 DIAGNOSIS — E11621 Type 2 diabetes mellitus with foot ulcer: Secondary | ICD-10-CM | POA: Diagnosis not present

## 2023-05-17 DIAGNOSIS — I10 Essential (primary) hypertension: Secondary | ICD-10-CM | POA: Diagnosis not present

## 2023-05-18 NOTE — Progress Notes (Signed)
 Natalie [name], Natalie [name] ([id number]) [id number] Page 1 of 7 Visit Report for 05/17/2023 Debridement Details Patient Name: Date of Service: Natalie [name]. 05/17/2023 10:30 A M Medical Record Number: [id number] Patient Account Number: [id number] Date of Birth/Sex: Treating RN: 29-Apr-1933 (89 y.o. Natalie [name] Natalie [name] Primary Care Provider: Amon [name] Other Clinician: Referring Provider: Treating Provider/Extender: Natalie [name], Natalie [name] EL Natalie [name] Natalie, Natalie [name] Weeks in Treatment: 2 Debridement Performed for Assessment: Wound #1 Right [name] Great oe Performed By: Physician Natalie OZELL KANDICE, MD The following information was scribed by: Natalie [name] The information was scribed for: Natalie OZELL G Debridement Type: Debridement Severity of Tissue Pre Debridement: Fat layer exposed Level of Consciousness (Pre-procedure): Awake and Alert Pre-procedure Verification/Time Out Yes - 11:28 Taken: Pain Control: Lidocaine 4% [name] opical Solution Percent of Wound Bed Debrided: 100% [name] Area Debrided (cm): otal 0.16 Tissue and other material debrided: Viable, Non-Viable, Callus, Slough, Subcutaneous, Skin: Dermis , Skin: Epidermis, Slough Level: Skin/Subcutaneous Tissue Debridement Description: Excisional Instrument: Curette Bleeding: Moderate Hemostasis Achieved: Silver Nitrate Response to Treatment: Procedure was tolerated well Level of Consciousness (Post- Awake and Alert procedure): Post Debridement Measurements of Total Wound Length: (cm) 0.5 Width: (cm) 0.4 Depth: (cm) 0.2 Volume: (cm) 0.031 Character of Wound/Ulcer Post Debridement: Stable Severity of Tissue Post Debridement: Fat layer exposed Post Procedure Diagnosis Same as Pre-procedure Electronic Signature(s) Signed: 05/17/2023 3:54:15 PM By: Natalie [name] Signed: 05/17/2023 4:49:51 PM By: Natalie Ozell MD Entered By: Natalie [name] on 05/17/2023 11:33:42 Natalie [name] Natalie [name] ([id number])  [id number] Page 2 of 7 -------------------------------------------------------------------------------- HPI Details Patient Name: Date of Service: Natalie [name]. 05/17/2023 10:30 A M Medical Record Number: [id number] Patient Account Number: [id number] Date of Birth/Sex: Treating RN: Oct 05, 1932 (89 y.o. Natalie [name] Natalie [name] Primary Care Provider: Amon [name] Other Clinician: Referring Provider: Treating Provider/Extender: Natalie [name], Natalie [name] EL Natalie [name] Natalie, Natalie [name] in Treatment: 2 History of Present Illness HPI Description: 05-02-2023 upon evaluation today patient presents for initial evaluation in the clinic although this is an individual whom I am seeing previously when I was working with pain management although that has been about 8 years ago. Fortunately I do not see any signs of active infection at this time which is great news. With that being said this wound has been present for about 4 months. She is not sure if she had an ingrown toenail that she was clipping in that area when something happened or not it has honestly been so far removed she is unsure. She has been seeing podiatry at [facility] and subsequently they have been treating this although she tells me that most recently they have been using mupirocin  and they have been trying to keep her from getting this wet. Subsequently she has been on doxycycline  though I do not believe she is on anything right now she tells me this has been infected a couple times with the course of treatment thus far. Her most notable change that she is seeing is when she is wearing the offloading postop shoe this seems to be better for her than using just a regular shoe which is when it reoccurred when she thought she had it healed although I think this may have just been more of a callusing over. I am unsure however as I was not involved in her care during that time. Podiatry has ordered new custom shoes but they are not  in yet. Patient does have a history of diabetes mellitus type 2 most recent hemoglobin A1c  on 02-21-2023 was 6.1. She also tells me that she has a history with hypertension this seems to be very well-controlled for the most part. Otherwise no major medical problems in general. 05-09-2023 upon evaluation today patient actually appears to be doing better in regard to her toe to some degree. I am actually very pleased with where where we stand I think the endoform however is drying out too much and not being productive for her as far as the toe is concerned. I think we may want to switch to Prisma this may be a better option. 12/31; patient has a punched-out wound on the tip of her right great toe. She is a diabetic. An x-ray is been done but not reported as of yet. I have not looked at this. We have been using Prisma Xeroform and gauze wrap. I believe the patient's daughter-in-law's changing the dressing Electronic Signature(s) Signed: 05/17/2023 4:49:51 PM By: Natalie Sharper MD Entered By: Natalie Sharper on 05/17/2023 12:17:47 -------------------------------------------------------------------------------- Physical Exam Details Patient Name: Date of Service: Natalie [name], Natalie [name]. 05/17/2023 10:30 A M Medical Record Number: [id number] Patient Account Number: [id number] Date of Birth/Sex: Treating RN: 01-12-1933 (89 y.o. Natalie [name] Natalie [name] Primary Care Provider: Amon [name] Other Clinician: Referring Provider: Treating Provider/Extender: Natalie [name], Natalie [name] EL Natalie [name] Natalie, [place] in Treatment: 2 Constitutional Sitting or standing Blood Pressure is within target range for patient.. Pulse regular and within target range for patient.Natalie [name] Respirations regular, non-labored and within target range.. Temperature is normal and within the target range for the patient.Natalie [name] appears in no distress. Notes Wound exam; right first toe tip. Relative depth to this wound which is small in terms of circumference. There was  also significant circumferential undermining. I used a #3 curette to remove this hemostasis with silver nitrate and pressure. This does not go to bone but there is not a lot of covering tissue here either. I see no evidence of an infection at this point Electronic Signature(s) Signed: 05/17/2023 4:49:51 PM By: Natalie Sharper MD Entered By: Natalie Sharper on 05/17/2023 12:18:53 Natalie LIONEL Natalie [name] ([id number]) [id number] Page 3 of 7 -------------------------------------------------------------------------------- Physician Orders Details Patient Name: Date of Service: Natalie [name]. 05/17/2023 10:30 A M Medical Record Number: [id number] Patient Account Number: [id number] Date of Birth/Sex: Treating RN: April 29, 1933 (89 y.o. Natalie [name] Natalie [name] Primary Care Provider: Amon [name] Other Clinician: Referring Provider: Treating Provider/Extender: Natalie [name], Natalie [name] EL Natalie [name] Natalie, [place] in Treatment: 2 The following information was scribed by: Natalie [name] The information was scribed for: Natalie SHARPER Natalie [name] Verbal / Phone Orders: No Diagnosis Coding Follow-up Appointments Return Appointment in 1 week. Bathing/ Shower/ Hygiene May shower; gently cleanse wound with antibacterial soap, rinse and pat dry prior to dressing wounds - Shower on days you change the dressing, take your shower with dressing on, then at end of shower take dressing off, clean with Dial original GOLD soap, dry and apply a new dressing. May shower with wound dressing protected with water repellent cover or cast protector. - If showering on days you are not changing dressing, you will need to protect area from becoming wet. No tub bath. Anesthetic (Use 'Patient Medications' Section for Anesthetic Order Entry) Lidocaine applied to wound bed Edema Control - Orders / Instructions Elevate, Exercise Daily and A void Standing for Long Periods of Time. Elevate legs to the level of the heart and pump  ankles as often as possible Elevate leg(s) parallel to the floor when sitting. DO YOUR BEST  to sleep in the bed at night. DO NOT sleep in your recliner. Long hours of sitting in a recliner leads to swelling of the legs and/or potential wounds on your backside. Wound Treatment Wound #1 - [name] Great oe Wound Laterality: Right Cleanser: Vashe 5.8 (oz) 3 x Per Week/15 Days Discharge Instructions: Use vashe 5.8 (oz) as directed Cleanser: Wound Cleanser 3 x Per Week/15 Days Discharge Instructions: Wash your hands with soap and water. Remove old dressing, discard into plastic bag and place into trash. Cleanse the wound with Wound Cleanser prior to applying a clean dressing using gauze sponges, not tissues or cotton balls. Do not scrub or use excessive force. Pat dry using gauze sponges, not tissue or cotton balls. Prim Dressing: Prisma 4.34 (in) 3 x Per Week/15 Days ary Discharge Instructions: Moisten w/normal saline or sterile water; Cover wound as directed. Do not remove from wound bed. Prim Dressing: Xeroform-HBD 2x2 (in/in) 3 x Per Week/15 Days ary Discharge Instructions: Apply Xeroform-HBD 2x2 (in/in) as directed Secondary Dressing: Gauze 3 x Per Week/15 Days Discharge Instructions: As directed: dry Secured With: Stretch Net Dressing, Latex-free, Size 5, Small-Head / Shoulder / Thigh 3 x Per Week/15 Days Discharge Instructions: size 2 Electronic Signature(s) Signed: 05/17/2023 12:02:06 PM By: Natalie [name] Signed: 05/17/2023 4:49:51 PM By: Natalie Sharper MD Entered By: Natalie [name] on 05/17/2023 12:02:06 Natalie LIONEL Natalie [name] ([id number]) [id number] Page 4 of 7 -------------------------------------------------------------------------------- Problem List Details Patient Name: Date of Service: Natalie [name]. 05/17/2023 10:30 A M Medical Record Number: [id number] Patient Account Number: [id number] Date of Birth/Sex: Treating RN: 1932/10/02 (89 y.o. Natalie [name] Natalie [name] Primary Care Provider: Amon [name] Other Clinician: Referring Provider: Treating Provider/Extender: Natalie [name], Natalie [name] EL Natalie [name] Natalie, [place] in Treatment: 2 Active Problems ICD-10 Encounter Code Description Active Date MDM Diagnosis E11.621 Type 2 diabetes mellitus with foot ulcer 05/02/2023 No Yes L97.512 Non-pressure chronic ulcer of other part of right foot with fat layer exposed 05/02/2023 No Yes I10 Essential (primary) hypertension 05/02/2023 No Yes Inactive Problems Resolved Problems Electronic Signature(s) Signed: 05/17/2023 4:49:51 PM By: Natalie Sharper MD Entered By: Natalie Sharper on 05/17/2023 12:14:36 -------------------------------------------------------------------------------- Progress Note Details Patient Name: Date of Service: Natalie LYNDA, Natalie [name]. 05/17/2023 10:30 A M Medical Record Number: [id number] Patient Account Number: [id number] Date of Birth/Sex: Treating RN: July 05, 1932 (89 y.o. Natalie [name] Natalie [name] Primary Care Provider: Amon [name] Other Clinician: Referring Provider: Treating Provider/Extender: Natalie [name], Natalie [name] EL Natalie [name] Natalie, [place] in Treatment: 2 Subjective History of Present Illness (HPI) 05-02-2023 upon evaluation today patient presents for initial evaluation in the clinic although this is an individual whom I am seeing previously when I was Natalie [name], Natalie [name] ([id number]) [id number] Page 5 of 7 working with pain management although that has been about 8 years ago. Fortunately I do not see any signs of active infection at this time which is great news. With that being said this wound has been present for about 4 months. She is not sure if she had an ingrown toenail that she was clipping in that area when something happened or not it has honestly been so far removed she is unsure. She has been seeing podiatry at [facility] and subsequently they have been treating this although she tells me that most recently  they have been using mupirocin  and they have been trying to keep her from getting this wet. Subsequently she has been on doxycycline  though I do not believe she is on anything right now  she tells me this has been infected a couple times with the course of treatment thus far. Her most notable change that she is seeing is when she is wearing the offloading postop shoe this seems to be better for her than using just a regular shoe which is when it reoccurred when she thought she had it healed although I think this may have just been more of a callusing over. I am unsure however as I was not involved in her care during that time. Podiatry has ordered new custom shoes but they are not in yet. Patient does have a history of diabetes mellitus type 2 most recent hemoglobin A1c on 02-21-2023 was 6.1. She also tells me that she has a history with hypertension this seems to be very well-controlled for the most part. Otherwise no major medical problems in general. 05-09-2023 upon evaluation today patient actually appears to be doing better in regard to her toe to some degree. I am actually very pleased with where where we stand I think the endoform however is drying out too much and not being productive for her as far as the toe is concerned. I think we may want to switch to Prisma this may be a better option. 12/31; patient has a punched-out wound on the tip of her right great toe. She is a diabetic. An x-ray is been done but not reported as of yet. I have not looked at this. We have been using Prisma Xeroform and gauze wrap. I believe the patient's daughter-in-law's changing the dressing Objective Constitutional Sitting or standing Blood Pressure is within target range for patient.. Pulse regular and within target range for patient.Natalie [name] Respirations regular, non-labored and within target range.. Temperature is normal and within the target range for the patient.Natalie [name] appears in no distress. Vitals Time Taken: 10:42 AM,  Height: 66 in, Weight: 176 lbs, BMI: 28.4, Temperature: 97.8 F, Pulse: 83 bpm, Respiratory Rate: 18 breaths/min, Blood Pressure: 149/76 mmHg. General Notes: Wound exam; right first toe tip. Relative depth to this wound which is small in terms of circumference. There was also significant circumferential undermining. I used a #3 curette to remove this hemostasis with silver nitrate and pressure. This does not go to bone but there is not a lot of covering tissue here either. I see no evidence of an infection at this point Integumentary (Hair, Skin) Wound #1 status is Open. Original cause of wound was Gradually Appeared. The date acquired was: 12/15/2022. The wound has been in treatment 2 weeks. The wound is located on the Right [name] Great. The wound measures 0.4cm length x 0.3cm width x 0.2cm depth; 0.094cm^2 area and 0.019cm^3 volume. There is Fat oe Layer (Subcutaneous Tissue) exposed. There is a medium amount of serosanguineous drainage noted. There is medium (34-66%) red, pink granulation within the wound bed. There is a medium (34-66%) amount of necrotic tissue within the wound bed. Assessment Active Problems ICD-10 Type 2 diabetes mellitus with foot ulcer Non-pressure chronic ulcer of other part of right foot with fat layer exposed Essential (primary) hypertension Procedures Wound #1 Pre-procedure diagnosis of Wound #1 is a Diabetic Wound/Ulcer of the Lower Extremity located on the Right [name] Great .Severity of Tissue Pre Debridement is: oe Fat layer exposed. There was a Excisional Skin/Subcutaneous Tissue Debridement with a total area of 0.16 sq cm performed by Natalie OZELL MATSU, MD. With the following instrument(s): Curette to remove Viable and Non-Viable tissue/material. Material removed includes Callus, Subcutaneous Tissue, Slough, Skin: Dermis, and Skin: Epidermis after  achieving pain control using Lidocaine 4% [name] opical Solution. No specimens were taken. A time out was conducted at 11:28,  prior to the start of the procedure. A Moderate amount of bleeding was controlled with Silver Nitrate. The procedure was tolerated well. Post Debridement Measurements: 0.5cm length x 0.4cm width x 0.2cm depth; 0.031cm^3 volume. Character of Wound/Ulcer Post Debridement is stable. Severity of Tissue Post Debridement is: Fat layer exposed. Post procedure Diagnosis Wound #1: Same as Pre-Procedure Plan Follow-up Appointments: Return Appointment in 1 week. Bathing/ Shower/ Hygiene: Natalie [name], Natalie [name] ([id number]) [id number] Page 6 of 7 May shower; gently cleanse wound with antibacterial soap, rinse and pat dry prior to dressing wounds - Shower on days you change the dressing, take your shower with dressing on, then at end of shower take dressing off, clean with Dial original GOLD soap, dry and apply a new dressing. May shower with wound dressing protected with water repellent cover or cast protector. - If showering on days you are not changing dressing, you will need to protect area from becoming wet. No tub bath. Anesthetic (Use 'Patient Medications' Section for Anesthetic Order Entry): Lidocaine applied to wound bed Edema Control - Orders / Instructions: Elevate, Exercise Daily and Avoid Standing for Long Periods of Time. Elevate legs to the level of the heart and pump ankles as often as possible Elevate leg(s) parallel to the floor when sitting. DO YOUR BEST to sleep in the bed at night. DO NOT sleep in your recliner. Long hours of sitting in a recliner leads to swelling of the legs and/or potential wounds on your backside. WOUND #1: - [name] Great Wound Laterality: Right oe Cleanser: Vashe 5.8 (oz) 3 x Per Week/15 Days Discharge Instructions: Use vashe 5.8 (oz) as directed Cleanser: Wound Cleanser 3 x Per Week/15 Days Discharge Instructions: Wash your hands with soap and water. Remove old dressing, discard into plastic bag and place into trash. Cleanse the wound with  Wound Cleanser prior to applying a clean dressing using gauze sponges, not tissues or cotton balls. Do not scrub or use excessive force. Pat dry using gauze sponges, not tissue or cotton balls. Prim Dressing: Prisma 4.34 (in) 3 x Per Week/15 Days ary Discharge Instructions: Moisten w/normal saline or sterile water; Cover wound as directed. Do not remove from wound bed. Prim Dressing: Xeroform-HBD 2x2 (in/in) 3 x Per Week/15 Days ary Discharge Instructions: Apply Xeroform-HBD 2x2 (in/in) as directed Secondary Dressing: Gauze 3 x Per Week/15 Days Discharge Instructions: As directed: dry Secured With: Stretch Net Dressing, Latex-free, Size 5, Small-Head / Shoulder / Thigh 3 x Per Week/15 Days Discharge Instructions: size 2 1. X-ray done but not reported. I will see if I can look at this later today. 2. No evidence of infection nothing was cultured 3. She was cautioned not to put any pressure on this and she promises me she is using the surgical shoe religiously which should suffice. 4. No need for antibiotics at this point. I explained this carefully to the patient. This is of course pending review of the x-ray Electronic Signature(s) Signed: 05/17/2023 4:49:51 PM By: Natalie Sharper MD Entered By: Natalie Sharper on 05/17/2023 12:19:49 -------------------------------------------------------------------------------- SuperBill Details Patient Name: Date of Service: Natalie [name], Natalie [name]. 05/17/2023 Medical Record Number: [id number] Patient Account Number: [id number] Date of Birth/Sex: Treating RN: April 01, 1933 (89 y.o. Natalie [name] Natalie [name] Primary Care Provider: Amon [name] Other Clinician: Referring Provider: Treating Provider/Extender: Natalie [name], Natalie [name] EL Natalie [name] Natalie, [place] Natalie [name] in Treatment: 2 Diagnosis Coding ICD-10 Codes  Code Description E11.621 Type 2 diabetes mellitus with foot ulcer L97.512 Non-pressure chronic ulcer of other part of right foot with fat layer exposed I10 Essential (primary)  hypertension Facility Procedures Physician Procedures : CPT4 Code Description Modifier 11042 11042 - WC PHYS SUBQ TISS 20 SQ CM ICD-10 Diagnosis Description L97.512 Non-pressure chronic ulcer of other part of right foot with fat layer exposed Quantity: 1 Electronic Signature(s) Signed: 05/17/2023 4:49:51 PM By: Natalie Sharper MD Entered By: Natalie Sharper on 05/17/2023 12:20:06

## 2023-05-18 NOTE — Progress Notes (Signed)
 Natalie, ORTEZ [name] ([id number]) [id number] Page 1 of 9 Visit Report for 05/17/2023 Arrival Information Details Patient Name: Date of Service: Natalie [name]. 05/17/2023 10:30 A M Medical Record Number: [id number] Patient Account Number: [id number] Date of Birth/Sex: Treating RN: Nov 25, 1932 (89 y.o. Natalie [name] Vaughn Mora Primary Care Sable Knoles: Amon Schanz Other Clinician: Referring Mekiah Cambridge: Treating Sharra Cayabyab/Extender: RO BSO N, MICHA EL KANDICE Amon, Jose Weeks in Treatment: 2 Visit Information History Since Last Visit Added or deleted any medications: No Patient Arrived: [name] Any new allergies or adverse reactions: No Arrival Time: 10:42 Had a fall or experienced change in No Accompanied By: family activities of daily living that may affect Transfer Assistance: None risk of falls: Patient Identification Verified: Yes Hospitalized since last visit: No Secondary Verification Process Completed: Yes Has Dressing in Place as Prescribed: Yes Patient Has Alerts: Yes Pain Present Now: No Patient Alerts: Type 2 diabetic Electronic Signature(s) Signed: 05/17/2023 3:54:15 PM By: Vaughn Mora Entered By: Vaughn Mora on 05/17/2023 10:42:27 -------------------------------------------------------------------------------- Clinic Level of Care Assessment Details Patient Name: Date of Service: Natalie [name]. 05/17/2023 10:30 A M Medical Record Number: [id number] Patient Account Number: [id number] Date of Birth/Sex: Treating RN: Mar 27, 1933 (89 y.o. Natalie [name] Vaughn Mora Primary Care Taziyah Iannuzzi: Amon Schanz Other Clinician: Referring Denicia Pagliarulo: Treating Paul Trettin/Extender: RO BSO N, MICHA EL KANDICE Amon, Jose Weeks in Treatment: 2 Clinic Level of Care Assessment Items TOOL 1 Quantity Score []  - 0 Use when EandM and Procedure is performed on INITIAL visit ASSESSMENTS - Nursing Assessment / Reassessment []  - 0 General Physical Exam (combine w/ comprehensive assessment  (listed just below) when performed on new pt. evals) []  - 0 Comprehensive Assessment (HX, ROS, Risk Assessments, Wounds Hx, etc.) ASSESSMENTS - Wound and Skin Assessment / Reassessment []  - 0 Dermatologic / Skin Assessment (not related to wound area) ASSESSMENTS - Ostomy and/or Continence Assessment and Care Natalie, [name] ([id number]) [id number] Page 2 of 9 []  - 0 Incontinence Assessment and Management []  - 0 Ostomy Care Assessment and Management (repouching, etc.) PROCESS - Coordination of Care []  - 0 Simple Patient / Family Education for ongoing care []  - 0 Complex (extensive) Patient / Family Education for ongoing care []  - 0 Staff obtains Chiropractor, Records, [name] Results / Process Orders est []  - 0 Staff telephones HHA, Nursing Homes / Clarify orders / etc []  - 0 Routine Transfer to another Facility (non-emergent condition) []  - 0 Routine Hospital Admission (non-emergent condition) []  - 0 New Admissions / Manufacturing Engineer / Ordering NPWT Apligraf, etc. , []  - 0 Emergency Hospital Admission (emergent condition) PROCESS - Special Needs []  - 0 Pediatric / Minor Patient Management []  - 0 Isolation Patient Management []  - 0 Hearing / Language / Visual special needs []  - 0 Assessment of Community assistance (transportation, D/C planning, etc.) []  - 0 Additional assistance / Altered mentation []  - 0 Support Surface(s) Assessment (bed, cushion, seat, etc.) INTERVENTIONS - Miscellaneous []  - 0 External ear exam []  - 0 Patient Transfer (multiple staff / Nurse, Adult / Similar devices) []  - 0 Simple Staple / Suture removal (25 or less) []  - 0 Complex Staple / Suture removal (26 or more) []  - 0 Hypo/Hyperglycemic Management (do not check if billed separately) []  - 0 Ankle / Brachial Index (ABI) - do not check if billed separately Has the patient been seen at the hospital within the last three years: Yes Total Score: 0 Level Of Care:  ____ Electronic Signature(s) Signed: 05/17/2023 3:54:15 PM By: Vaughn,  Caitlin Entered By: Vaughn Mora on 05/17/2023 12:02:13 -------------------------------------------------------------------------------- Encounter Discharge Information Details Patient Name: Date of Service: Natalie [name]. 05/17/2023 10:30 A M Medical Record Number: [id number] Patient Account Number: [id number] Date of Birth/Sex: Treating RN: 1933-03-31 (89 y.o. Natalie [name] Vaughn Mora Primary Care Quanell Loughney: Amon Schanz Other Clinician: Referring Aprille Sawhney: Treating Lyndzie Zentz/Extender: RO BSO N, MICHA EL KANDICE Amon, Jose Weeks in Treatment: 2 Encounter Discharge Information Items Post Procedure Vitals Discharge Condition: Stable Temperature (F): 97.8 Natalie, [name] ([id number]) [id number] Page 3 of 9 Ambulatory Status: Walker Pulse (bpm): 83 Discharge Destination: Home Respiratory Rate (breaths/min): 18 Transportation: Private Auto Blood Pressure (mmHg): 149/76 Accompanied By: family Schedule Follow-up Appointment: Yes Clinical Summary of Care: Electronic Signature(s) Signed: 05/17/2023 12:03:29 PM By: Vaughn Mora Entered By: Vaughn Mora on 05/17/2023 12:03:28 -------------------------------------------------------------------------------- Lower Extremity Assessment Details Patient Name: Date of Service: Natalie [name]. 05/17/2023 10:30 A M Medical Record Number: [id number] Patient Account Number: [id number] Date of Birth/Sex: Treating RN: 1933/01/27 (89 y.o. Natalie [name] Vaughn Mora Primary Care Ladarrious Kirksey: Amon Schanz Other Clinician: Referring Seve Monette: Treating Issabella Rix/Extender: RO BSO N, MICHA EL KANDICE Amon, Jose Weeks in Treatment: 2 Edema Assessment Assessed: [Left: No] [Right: No] Edema: [Left: Ye] [Right: s] Calf Left: Right: Point of Measurement: 35 cm From Medial Instep 37.5 cm Ankle Left: Right: Point of Measurement: 9 cm From Medial Instep 27 cm Vascular  Assessment Pulses: Dorsalis Pedis Palpable: [Right:Yes] Extremity colors, hair growth, and conditions: Extremity Color: [Right:Normal] Hair Growth on Extremity: [Right:Yes] Temperature of Extremity: [Right:Warm < 3 seconds] Toe Nail Assessment Left: Right: Thick: Yes Discolored: Yes Deformed: No Improper Length and Hygiene: No Electronic Signature(s) Signed: 05/17/2023 3:54:15 PM By: Vaughn Mora Entered By: Vaughn Mora on 05/17/2023 10:46:25 Natalie [name] ([id number]) [id number] Page 4 of 9 -------------------------------------------------------------------------------- Multi Wound Chart Details Patient Name: Date of Service: Natalie [name]. 05/17/2023 10:30 A M Medical Record Number: [id number] Patient Account Number: [id number] Date of Birth/Sex: Treating RN: 1932/06/07 (89 y.o. Natalie [name] Vaughn Mora Primary Care Zakhi Dupre: Amon Schanz Other Clinician: Referring Eriel Dunckel: Treating Kiyanna Biegler/Extender: RO BSO N, MICHA EL KANDICE Amon, [place] in Treatment: 2 Vital Signs Height(in): 66 Pulse(bpm): 83 Weight(lbs): 176 Blood Pressure(mmHg): 149/76 Body Mass Index(BMI): 28.4 Temperature(F): 97.8 Respiratory Rate(breaths/min): 18 [1:Photos:] [N/A:N/A] Right [name] Great oe N/A N/A Wound Location: Gradually Appeared N/A N/A Wounding Event: Diabetic Wound/Ulcer of the Lower N/A N/A Primary Etiology: Extremity Chronic sinus problems/congestion, N/A N/A Comorbid History: Asthma, Hypertension, Type II Diabetes 12/15/2022 N/A N/A Date Acquired: 2 N/A N/A Weeks of Treatment: Open N/A N/A Wound Status: No N/A N/A Wound Recurrence: 0.4x0.3x0.2 N/A N/A Measurements L x W x D (cm) 0.094 N/A N/A A (cm) : rea 0.019 N/A N/A Volume (cm) : 20.30% N/A N/A % Reduction in A rea: 59.60% N/A N/A % Reduction in Volume: Grade 2 N/A N/A Classification: Medium N/A N/A Exudate A mount: Serosanguineous N/A N/A Exudate Type: red, brown N/A  N/A Exudate Color: Medium (34-66%) N/A N/A Granulation A mount: Red, Pink N/A N/A Granulation Quality: Medium (34-66%) N/A N/A Necrotic A mount: Fat Layer (Subcutaneous Tissue): Yes N/A N/A Exposed Structures: None N/A N/A Epithelialization: Treatment Notes Electronic Signature(s) Signed: 05/17/2023 3:54:15 PM By: Vaughn Mora Entered By: Vaughn Mora on 05/17/2023 10:53:32 Natalie [name] ([id number]) [id number] Page 5 of 9 -------------------------------------------------------------------------------- Multi-Disciplinary Care Plan Details Patient Name: Date of Service: Natalie [name]. 05/17/2023 10:30 A M Medical Record Number: [id number] Patient Account Number: [id number] Date of Birth/Sex: Treating RN:  November 02, 1932 (89 y.o. Natalie [name] Vaughn Mora Primary Care Vienne Corcoran: Amon Schanz Other Clinician: Referring Shanterica Biehler: Treating Adena Sima/Extender: RO BSO N, MICHA EL KANDICE Amon, Jose Weeks in Treatment: 2 Active Inactive Necrotic Tissue Nursing Diagnoses: Impaired tissue integrity related to necrotic/devitalized tissue Knowledge deficit related to management of necrotic/devitalized tissue Goals: Necrotic/devitalized tissue will be minimized in the wound bed Date Initiated: 05/02/2023 Target Resolution Date: 06/27/2023 Goal Status: Active Patient/caregiver will verbalize understanding of reason and process for debridement of necrotic tissue Date Initiated: 05/02/2023 Date Inactivated: 05/02/2023 Target Resolution Date: 05/02/2023 Goal Status: Met Interventions: Assess patient pain level pre-, during and post procedure and prior to discharge Provide education on necrotic tissue and debridement process Treatment Activities: Apply topical anesthetic as ordered : 05/02/2023 Excisional debridement : 05/02/2023 Notes: Wound/Skin Impairment Nursing Diagnoses: Impaired tissue integrity Goals: Ulcer/skin breakdown will have a volume reduction of  30% by week 4 Date Initiated: 05/02/2023 Target Resolution Date: 05/30/2023 Goal Status: Active Ulcer/skin breakdown will have a volume reduction of 50% by week 8 Date Initiated: 05/02/2023 Target Resolution Date: 06/27/2023 Goal Status: Active Ulcer/skin breakdown will have a volume reduction of 80% by week 12 Date Initiated: 05/02/2023 Target Resolution Date: 07/25/2023 Goal Status: Active Ulcer/skin breakdown will heal within 14 weeks Date Initiated: 05/02/2023 Target Resolution Date: 08/08/2023 Goal Status: Active Interventions: Assess patient/caregiver ability to obtain necessary supplies Assess patient/caregiver ability to perform ulcer/skin care regimen upon admission and as needed Assess ulceration(s) every visit Provide education on ulcer and skin care Notes: Natalie [name], Natalie [name] ([id number]) [phone].pdf Page 6 of 9 Electronic Signature(s) Signed: 05/17/2023 12:02:28 PM By: Vaughn Mora Entered By: Vaughn Mora on 05/17/2023 12:02:28 -------------------------------------------------------------------------------- Pain Assessment Details Patient Name: Date of Service: Natalie [name]. 05/17/2023 10:30 A M Medical Record Number: [id number] Patient Account Number: [id number] Date of Birth/Sex: Treating RN: September 14, 1932 (89 y.o. Natalie [name] Vaughn Mora Primary Care Anna Livers: Amon Schanz Other Clinician: Referring Tiarra Anastacio: Treating Tailer Volkert/Extender: RO BSO N, MICHA EL KANDICE Amon, Jose Weeks in Treatment: 2 Active Problems Location of Pain Severity and Description of Pain Patient Has Paino No Site Locations Pain Management and Medication Current Pain Management: Electronic Signature(s) Signed: 05/17/2023 3:54:15 PM By: Vaughn Mora Entered By: Vaughn Mora on 05/17/2023 10:42:50 -------------------------------------------------------------------------------- Patient/Caregiver Education Details Patient Name: Date of Service: Natalie LYNDA, Natalie NNIE  [name]. 12/31/2024andnbsp10:30 Natalie [name] Natalie LIONEL [name] ([id number]) [id number] Page 7 of 9 Medical Record Number: [id number] Patient Account Number: [id number] Date of Birth/Gender: Treating RN: January 16, 1933 (89 y.o. Natalie [name] Vaughn Mora Primary Care Physician: Amon Schanz Other Clinician: Referring Physician: Treating Physician/Extender: RO BSO N, MICHA EL KANDICE Amon, Jose Weeks in Treatment: 2 Education Assessment Education Provided To: Patient Education Topics Provided Wound Debridement: Handouts: Wound Debridement Methods: Explain/Verbal Responses: State content correctly Wound/Skin Impairment: Handouts: Caring for Your Ulcer Methods: Explain/Verbal Responses: State content correctly Electronic Signature(s) Signed: 05/17/2023 3:54:15 PM By: Vaughn Mora Entered By: Vaughn Mora on 05/17/2023 12:02:43 -------------------------------------------------------------------------------- Wound Assessment Details Patient Name: Date of Service: Natalie [name]. 05/17/2023 10:30 A M Medical Record Number: [id number] Patient Account Number: [id number] Date of Birth/Sex: Treating RN: [birth year] (89 y.o. Natalie [name] Vaughn Mora Primary Care Tavio Biegel: Amon Schanz Other Clinician: Referring Marieta Markov: Treating Adelma Bowdoin/Extender: RO BSO N, MICHA EL KANDICE Amon, Jose Weeks in Treatment: 2 Wound Status Wound Number: 1 Primary Diabetic Wound/Ulcer of the Lower Extremity Etiology: Wound Location: Right [name] Great oe Wound Status: Open Wounding Event: Gradually Appeared Comorbid Chronic sinus problems/congestion, Asthma, Hypertension, Date Acquired: 12/15/2022 History: Type II Diabetes Weeks Of  Treatment: 2 Clustered Wound: No Photos Wound Measurements Natalie [name], Natalie [name] ([id number]) Length: (cm) 0.4 Width: (cm) 0.3 Depth: (cm) 0.2 Area: (cm) 0.094 Volume: (cm) 0.019 [id number] Page 8 of 9 % Reduction in Area: 20.3% % Reduction in Volume:  59.6% Epithelialization: None Wound Description Classification: Grade 2 Exudate Amount: Medium Exudate Type: Serosanguineous Exudate Color: red, brown Foul Odor After Cleansing: No Slough/Fibrino Yes Wound Bed Granulation Amount: Medium (34-66%) Exposed Structure Granulation Quality: Red, Pink Fat Layer (Subcutaneous Tissue) Exposed: Yes Necrotic Amount: Medium (34-66%) Treatment Notes Wound #1 (Toe Great) Wound Laterality: Right Cleanser Vashe 5.8 (oz) Discharge Instruction: Use vashe 5.8 (oz) as directed Wound Cleanser Discharge Instruction: Wash your hands with soap and water. Remove old dressing, discard into plastic bag and place into trash. Cleanse the wound with Wound Cleanser prior to applying a clean dressing using gauze sponges, not tissues or cotton balls. Do not scrub or use excessive force. Pat dry using gauze sponges, not tissue or cotton balls. Peri-Wound Care Topical Primary Dressing Prisma 4.34 (in) Discharge Instruction: Moisten w/normal saline or sterile water; Cover wound as directed. Do not remove from wound bed. Xeroform-HBD 2x2 (in/in) Discharge Instruction: Apply Xeroform-HBD 2x2 (in/in) as directed Secondary Dressing Gauze Discharge Instruction: As directed: dry Secured With Stretch Net Dressing, Latex-free, Size 5, Small-Head / Shoulder / Thigh Discharge Instruction: size 2 Compression Wrap Compression Stockings Add-Ons Electronic Signature(s) Signed: 05/17/2023 3:54:15 PM By: Vaughn Mora Entered By: Vaughn Mora on 05/17/2023 10:53:14 -------------------------------------------------------------------------------- Vitals Details Patient Name: Date of Service: Natalie [name], Natalie NNIE [name]. 05/17/2023 10:30 A M Medical Record Number: [id number] Patient Account Number: [id number] Natalie [name], Natalie [name] ([id number]) [phone].pdf Page 9 of 9 Date of Birth/Sex: Treating RN: 1932/06/22 (89 y.o. Natalie [name] Vaughn Mora Primary Care  TRUE Garciamartinez: Other Clinician: Amon Schanz Referring Clovis Mankins: Treating Karol Skarzynski/Extender: RO BSO N, MICHA EL KANDICE Amon, Jose Weeks in Treatment: 2 Vital Signs Time Taken: 10:42 Temperature (F): 97.8 Height (in): 66 Pulse (bpm): 83 Weight (lbs): 176 Respiratory Rate (breaths/min): 18 Body Mass Index (BMI): 28.4 Blood Pressure (mmHg): 149/76 Reference Range: 80 - 120 mg / dl Electronic Signature(s) Signed: 05/17/2023 3:54:15 PM By: Vaughn Mora Entered By: Vaughn Mora on 05/17/2023 10:42:43

## 2023-05-22 ENCOUNTER — Other Ambulatory Visit: Payer: Self-pay | Admitting: Internal Medicine

## 2023-05-24 ENCOUNTER — Ambulatory Visit: Payer: Medicare PPO | Admitting: Podiatry

## 2023-05-26 ENCOUNTER — Other Ambulatory Visit: Payer: Self-pay | Admitting: Internal Medicine

## 2023-05-27 ENCOUNTER — Encounter: Payer: Medicare PPO | Attending: Physician Assistant | Admitting: Physician Assistant

## 2023-05-27 DIAGNOSIS — E1151 Type 2 diabetes mellitus with diabetic peripheral angiopathy without gangrene: Secondary | ICD-10-CM | POA: Insufficient documentation

## 2023-05-27 DIAGNOSIS — I10 Essential (primary) hypertension: Secondary | ICD-10-CM | POA: Insufficient documentation

## 2023-05-27 DIAGNOSIS — L97512 Non-pressure chronic ulcer of other part of right foot with fat layer exposed: Secondary | ICD-10-CM | POA: Insufficient documentation

## 2023-05-27 DIAGNOSIS — J45909 Unspecified asthma, uncomplicated: Secondary | ICD-10-CM | POA: Diagnosis not present

## 2023-05-27 DIAGNOSIS — E11621 Type 2 diabetes mellitus with foot ulcer: Secondary | ICD-10-CM | POA: Insufficient documentation

## 2023-05-27 NOTE — Progress Notes (Addendum)
 BRALEY, LUCKENBAUGH [name] ([id number]) [id number] Page 1 of 7 Visit Report for 05/27/2023 Chief Complaint Document Details Patient Name: Date of Service: Natalie [name] [name]. 05/27/2023 9:45 A M Medical Record Number: [id number] Patient Account Number: [id number] Date of Birth/Sex: Treating RN: [birth year] (89 y.o. Natalie [name] Primary Care Provider: Amon Schanz Other Clinician: Referring Provider: Treating Provider/Extender: Bethena Andre Amon Schanz Weeks in Treatment: 3 Information Obtained from: Patient Chief Complaint Right great toe ulcer Electronic Signature(s) Signed: 05/27/2023 9:36:02 AM By: Bethena Andre PA-C Entered By: Bethena Andre on 05/27/2023 09:36:02 -------------------------------------------------------------------------------- Debridement Details Patient Name: Date of Service: Natalie [name], Natalie [name] [name]. 05/27/2023 9:45 A M Medical Record Number: [id number] Patient Account Number: [id number] Date of Birth/Sex: Treating RN: 1933/02/10 (89 y.o. Natalie [name] Primary Care Provider: Amon Schanz Other Clinician: Referring Provider: Treating Provider/Extender: Bethena Andre Amon Schanz Weeks in Treatment: 3 Debridement Performed for Assessment: Wound #1 Right [name] Great oe Performed By: Physician Bethena Andre, PA-C The following information was scribed by: [name] The information was scribed for: Bethena Andre Debridement Type: Debridement Severity of Tissue Pre Debridement: Fat layer exposed Level of Consciousness (Pre-procedure): Awake and Alert Pre-procedure Verification/Time Out Yes - 10:19 Taken: Start Time: 10:19 Percent of Wound Bed Debrided: 100% [name] Area Debrided (cm): otal 0.03 Tissue and other material debrided: Viable, Non-Viable, Callus, Slough, Subcutaneous, Slough Level: Skin/Subcutaneous Tissue Debridement Description: Excisional Instrument: Curette Bleeding: Minimum Hemostasis Achieved: Pressure Natalie [name], Natalie [name] ([id number])  [phone].pdf Page 2 of 7 Procedural Pain: 0 Post Procedural Pain: 0 Response to Treatment: Procedure was tolerated well Level of Consciousness (Post- Awake and Alert procedure): Post Debridement Measurements of Total Wound Length: (cm) 0.2 Width: (cm) 0.2 Depth: (cm) 0.2 Volume: (cm) 0.006 Character of Wound/Ulcer Post Debridement: Stable Severity of Tissue Post Debridement: Fat layer exposed Post Procedure Diagnosis Same as Pre-procedure Electronic Signature(s) Signed: 05/27/2023 12:05:56 PM By: [name] MSN RN CNS WTA Signed: 05/27/2023 12:58:57 PM By: Bethena Andre PA-C Entered By: [name] on 05/27/2023 10:21:59 -------------------------------------------------------------------------------- HPI Details Patient Name: Date of Service: Natalie [name], Natalie [name] [name]. 05/27/2023 9:45 A M Medical Record Number: [id number] Patient Account Number: [id number] Date of Birth/Sex: Treating RN: 03-28-1933 (89 y.o. Natalie [name] Primary Care Provider: Amon Schanz Other Clinician: Referring Provider: Treating Provider/Extender: Bethena Andre Amon Schanz Weeks in Treatment: 3 History of Present Illness HPI Description: 05-02-2023 upon evaluation today patient presents for initial evaluation in the clinic although this is an individual whom I am seeing previously when I was working with pain management although that has been about 8 years ago. Fortunately I do not see any signs of active infection at this time which is great news. With that being said this wound has been present for about 4 months. She is not sure if she had an ingrown toenail that she was clipping in that area when something happened or not it has honestly been so far removed she is unsure. She has been seeing podiatry at [facility] and subsequently they have been treating this although she tells me that most recently they have been using mupirocin  and they have been trying to keep her from getting  this wet. Subsequently she has been on doxycycline  though I do not believe she is on anything right now she tells me this has been infected a couple times with the course of treatment thus far. Her most notable change that she is seeing is when she is wearing the offloading postop shoe this seems to be  better for her than using just a regular shoe which is when it reoccurred when she thought she had it healed although I think this may have just been more of a callusing over. I am unsure however as I was not involved in her care during that time. Podiatry has ordered new custom shoes but they are not in yet. Patient does have a history of diabetes mellitus type 2 most recent hemoglobin A1c on 02-21-2023 was 6.1. She also tells me that she has a history with hypertension this seems to be very well-controlled for the most part. Otherwise no major medical problems in general. 05-09-2023 upon evaluation today patient actually appears to be doing better in regard to her toe to some degree. I am actually very pleased with where where we stand I think the endoform however is drying out too much and not being productive for her as far as the toe is concerned. I think we may want to switch to Prisma this may be a better option. 12/31; patient has a punched-out wound on the tip of her right great toe. She is a diabetic. An x-ray is been done but not reported as of yet. I have not looked at this. We have been using Prisma Xeroform and gauze wrap. I believe the patient's daughter-in-law's changing the dressing 05-27-23 upon evaluation today patient appears to be doing well currently in regard to her wound. She has been tolerating the dressing changes she is going require some debridement today and I discussed that with her it is also noted that she is having a lot of issues with the collagen drying out. I think there may be some techniques that can be initiated here to prevent that from happening. Electronic  Signature(s) Signed: 05/27/2023 12:41:46 PM By: Bethena Ferraris PA-C Entered By: Bethena Ferraris on 05/27/2023 12:41:46 Natalie [name] ([id number]) [id number] Page 3 of 7 -------------------------------------------------------------------------------- Physical Exam Details Patient Name: Date of Service: Natalie [name] [name]. 05/27/2023 9:45 A M Medical Record Number: [id number] Patient Account Number: [id number] Date of Birth/Sex: Treating RN: 1932/11/29 (89 y.o. Natalie [name] Primary Care Provider: Amon Schanz Other Clinician: Referring Provider: Treating Provider/Extender: Bethena Ferraris Amon Schanz Weeks in Treatment: 3 Constitutional Well-nourished and well-hydrated in no acute distress. Respiratory normal breathing without difficulty. Psychiatric this patient is able to make decisions and demonstrates good insight into disease process. Alert and Oriented x 3. pleasant and cooperative. Notes Upon inspection patient's wound bed did require sharp debridement clearway necrotic debris I did perform debridement today and postdebridement wound bed appears to be doing significantly better which is great news I do not see any evidence of infection at this time. Electronic Signature(s) Signed: 05/27/2023 12:41:59 PM By: Bethena Ferraris PA-C Entered By: Bethena Ferraris on 05/27/2023 12:41:59 -------------------------------------------------------------------------------- Physician Orders Details Patient Name: Date of Service: Natalie [name], Natalie [name] [name]. 05/27/2023 9:45 A M Medical Record Number: [id number] Patient Account Number: [id number] Date of Birth/Sex: Treating RN: [birth year] (89 y.o. Natalie [name] Primary Care Provider: Amon Schanz Other Clinician: Referring Provider: Treating Provider/Extender: Bethena Ferraris Amon Schanz Weeks in Treatment: 3 The following information was scribed by: [name] The information was scribed for: Bethena Ferraris Verbal / Phone Orders: No Diagnosis  Coding ICD-10 Coding Code Description E11.621 Type 2 diabetes mellitus with foot ulcer L97.512 Non-pressure chronic ulcer of other part of right foot with fat layer exposed I10 Essential (primary) hypertension Follow-up Appointments Return Appointment in 1 week. Natalie [name], Natalie [name] ([id number]) [id number] Page 4 of 7  Bathing/ Shower/ Hygiene May shower; gently cleanse wound with antibacterial soap, rinse and pat dry prior to dressing wounds - Shower on days you change the dressing, take your shower with dressing on, then at end of shower take dressing off, clean with Dial original GOLD soap, dry and apply a new dressing. May shower with wound dressing protected with water repellent cover or cast protector. - If showering on days you are not changing dressing, you will need to protect area from becoming wet. No tub bath. Anesthetic (Use 'Patient Medications' Section for Anesthetic Order Entry) Lidocaine applied to wound bed Edema Control - Orders / Instructions Elevate, Exercise Daily and A void Standing for Long Periods of Time. Elevate legs to the level of the heart and pump ankles as often as possible Elevate leg(s) parallel to the floor when sitting. DO YOUR BEST to sleep in the bed at night. DO NOT sleep in your recliner. Long hours of sitting in a recliner leads to swelling of the legs and/or potential wounds on your backside. Wound Treatment Wound #1 - [name] Great oe Wound Laterality: Right Cleanser: Vashe 5.8 (oz) 3 x Per Week/15 Days Discharge Instructions: Use vashe 5.8 (oz) as directed Cleanser: Wound Cleanser 3 x Per Week/15 Days Discharge Instructions: Wash your hands with soap and water. Remove old dressing, discard into plastic bag and place into trash. Cleanse the wound with Wound Cleanser prior to applying a clean dressing using gauze sponges, not tissues or cotton balls. Do not scrub or use excessive force. Pat dry using gauze sponges, not tissue  or cotton balls. Prim Dressing: Prisma 4.34 (in) 3 x Per Week/15 Days ary Discharge Instructions: Moisten w/normal saline or sterile water; Cover wound as directed. Do not remove from wound bed. Prim Dressing: Xeroform-HBD 2x2 (in/in) 3 x Per Week/15 Days ary Discharge Instructions: Apply Xeroform-HBD 2x2 (in/in) as directed Secondary Dressing: Coverlet Latex-Free Fabric Adhesive Dressings 3 x Per Week/15 Days Discharge Instructions: Knuckle Electronic Signature(s) Signed: 05/27/2023 12:05:56 PM By: [name] MSN RN CNS WTA Signed: 05/27/2023 12:58:57 PM By: Bethena Ferraris PA-C Entered By: [name] on 05/27/2023 10:26:50 -------------------------------------------------------------------------------- Problem List Details Patient Name: Date of Service: Natalie [name], Natalie [name] [name]. 05/27/2023 9:45 A M Medical Record Number: [id number] Patient Account Number: [id number] Date of Birth/Sex: Treating RN: [birth year] (89 y.o. Natalie [name] Primary Care Provider: Amon Schanz Other Clinician: Referring Provider: Treating Provider/Extender: Bethena Ferraris Amon Schanz Weeks in Treatment: 3 Active Problems ICD-10 Encounter Code Description Active Date MDM Diagnosis E11.621 Type 2 diabetes mellitus with foot ulcer 05/02/2023 No Yes L97.512 Non-pressure chronic ulcer of other part of right foot with fat layer exposed 05/02/2023 No Yes Natalie [name], Natalie [name] ([id number]) [phone].pdf Page 5 of 7 I10 Essential (primary) hypertension 05/02/2023 No Yes Inactive Problems Resolved Problems Electronic Signature(s) Signed: 05/27/2023 9:35:54 AM By: Bethena Ferraris PA-C Entered By: Bethena Ferraris on 05/27/2023 09:35:54 -------------------------------------------------------------------------------- Progress Note Details Patient Name: Date of Service: A [name], Natalie [name] [name]. 05/27/2023 9:45 A M Medical Record Number: [id number] Patient Account Number: [id number] Date of Birth/Sex: Treating  RN: [birth year] (89 y.o. Natalie [name] Primary Care Provider: Amon Schanz Other Clinician: Referring Provider: Treating Provider/Extender: Bethena Ferraris Amon Schanz Weeks in Treatment: 3 Subjective Chief Complaint Information obtained from Patient Right great toe ulcer History of Present Illness (HPI) 05-02-2023 upon evaluation today patient presents for initial evaluation in the clinic although this is an individual whom I am seeing previously when I was working with pain management although that has been  about 8 years ago. Fortunately I do not see any signs of active infection at this time which is great news. With that being said this wound has been present for about 4 months. She is not sure if she had an ingrown toenail that she was clipping in that area when something happened or not it has honestly been so far removed she is unsure. She has been seeing podiatry at [facility] and subsequently they have been treating this although she tells me that most recently they have been using mupirocin  and they have been trying to keep her from getting this wet. Subsequently she has been on doxycycline  though I do not believe she is on anything right now she tells me this has been infected a couple times with the course of treatment thus far. Her most notable change that she is seeing is when she is wearing the offloading postop shoe this seems to be better for her than using just a regular shoe which is when it reoccurred when she thought she had it healed although I think this may have just been more of a callusing over. I am unsure however as I was not involved in her care during that time. Podiatry has ordered new custom shoes but they are not in yet. Patient does have a history of diabetes mellitus type 2 most recent hemoglobin A1c on 02-21-2023 was 6.1. She also tells me that she has a history with hypertension this seems to be very well-controlled for the most part. Otherwise no major  medical problems in general. 05-09-2023 upon evaluation today patient actually appears to be doing better in regard to her toe to some degree. I am actually very pleased with where where we stand I think the endoform however is drying out too much and not being productive for her as far as the toe is concerned. I think we may want to switch to Prisma this may be a better option. 12/31; patient has a punched-out wound on the tip of her right great toe. She is a diabetic. An x-ray is been done but not reported as of yet. I have not looked at this. We have been using Prisma Xeroform and gauze wrap. I believe the patient's daughter-in-law's changing the dressing 05-27-23 upon evaluation today patient appears to be doing well currently in regard to her wound. She has been tolerating the dressing changes she is going require some debridement today and I discussed that with her it is also noted that she is having a lot of issues with the collagen drying out. I think there may be some techniques that can be initiated here to prevent that from happening. [address] RANELLE, Natalie [name] ([id number]) [id number] Page 6 of 7 Constitutional Well-nourished and well-hydrated in no acute distress. Vitals Time Taken: 9:34 AM, Height: 66 in, Weight: 176 lbs, BMI: 28.4, Temperature: 97.8 F, Pulse: 84 bpm, Respiratory Rate: 18 breaths/min, Blood Pressure: 163/75 mmHg. Respiratory normal breathing without difficulty. Psychiatric this patient is able to make decisions and demonstrates good insight into disease process. Alert and Oriented x 3. pleasant and cooperative. General Notes: Upon inspection patient's wound bed did require sharp debridement clearway necrotic debris I did perform debridement today and postdebridement wound bed appears to be doing significantly better which is great news I do not see any evidence of infection at this time. Integumentary (Hair, Skin) Wound #1 status is  Open. Original cause of wound was Gradually Appeared. The date acquired was: 12/15/2022. The wound has been  in treatment 3 weeks. The wound is located on the Right [name] Great. The wound measures 0.2cm length x 0.2cm width x 0.2cm depth; 0.031cm^2 area and 0.006cm^3 volume. There is Fat oe Layer (Subcutaneous Tissue) exposed. There is a medium amount of serosanguineous drainage noted. There is medium (34-66%) red, pink granulation within the wound bed. There is a medium (34-66%) amount of necrotic tissue within the wound bed. Assessment Active Problems ICD-10 Type 2 diabetes mellitus with foot ulcer Non-pressure chronic ulcer of other part of right foot with fat layer exposed Essential (primary) hypertension Procedures Wound #1 Pre-procedure diagnosis of Wound #1 is a Diabetic Wound/Ulcer of the Lower Extremity located on the Right [name] Great .Severity of Tissue Pre Debridement is: oe Fat layer exposed. There was a Excisional Skin/Subcutaneous Tissue Debridement with a total area of 0.03 sq cm performed by Bethena Ferraris, PA-C. With the following instrument(s): Curette to remove Viable and Non-Viable tissue/material. Material removed includes Callus, Subcutaneous Tissue, and Slough. No specimens were taken. A time out was conducted at 10:19, prior to the start of the procedure. A Minimum amount of bleeding was controlled with Pressure. The procedure was tolerated well with a pain level of 0 throughout and a pain level of 0 following the procedure. Post Debridement Measurements: 0.2cm length x 0.2cm width x 0.2cm depth; 0.006cm^3 volume. Character of Wound/Ulcer Post Debridement is stable. Severity of Tissue Post Debridement is: Fat layer exposed. Post procedure Diagnosis Wound #1: Same as Pre-Procedure Plan Follow-up Appointments: Return Appointment in 1 week. Bathing/ Shower/ Hygiene: May shower; gently cleanse wound with antibacterial soap, rinse and pat dry prior to dressing wounds - Shower on days  you change the dressing, take your shower with dressing on, then at end of shower take dressing off, clean with Dial original GOLD soap, dry and apply a new dressing. May shower with wound dressing protected with water repellent cover or cast protector. - If showering on days you are not changing dressing, you will need to protect area from becoming wet. No tub bath. Anesthetic (Use 'Patient Medications' Section for Anesthetic Order Entry): Lidocaine applied to wound bed Edema Control - Orders / Instructions: Elevate, Exercise Daily and Avoid Standing for Long Periods of Time. Elevate legs to the level of the heart and pump ankles as often as possible Elevate leg(s) parallel to the floor when sitting. DO YOUR BEST to sleep in the bed at night. DO NOT sleep in your recliner. Long hours of sitting in a recliner leads to swelling of the legs and/or potential wounds on your backside. WOUND #1: - [name] Great Wound Laterality: Right oe Cleanser: Vashe 5.8 (oz) 3 x Per Week/15 Days Discharge Instructions: Use vashe 5.8 (oz) as directed Cleanser: Wound Cleanser 3 x Per Week/15 Days Discharge Instructions: Wash your hands with soap and water. Remove old dressing, discard into plastic bag and place into trash. Cleanse the wound with Wound Cleanser prior to applying a clean dressing using gauze sponges, not tissues or cotton balls. Do not scrub or use excessive force. Pat dry using gauze sponges, not tissue or cotton balls. Prim Dressing: Prisma 4.34 (in) 3 x Per Week/15 Days ary Discharge Instructions: Moisten w/normal saline or sterile water; Cover wound as directed. Do not remove from wound bed. Prim Dressing: Xeroform-HBD 2x2 (in/in) 3 x Per Week/15 Days ary Discharge Instructions: Apply Xeroform-HBD 2x2 (in/in) as directed Secondary Dressing: Coverlet Latex-Free Fabric Adhesive Dressings 3 x Per Week/15 Days Natalie [name], Natalie [name] ([id number]) [phone].pdf Page 7 of  7  Discharge Instructions: Knuckle 1. I would recommend that we have the patient going to continue to monitor for any evidence of infection or worsening. Overall I do believe that we are making good headway here towards closure which is great news. 2. We will use the Prisma to make sure there moistened in this very well before putting and then and then subsequently the Xeroform followed by the coverlet to try to maintain this from drying out I think will be a much better way to go. Will see her back for a follow-up visit in 1 week's time. Electronic Signature(s) Signed: 05/27/2023 12:42:36 PM By: Bethena Ferraris PA-C Entered By: Bethena Ferraris on 05/27/2023 12:42:35 -------------------------------------------------------------------------------- SuperBill Details Patient Name: Date of Service: Natalie [name], Natalie [name] [name]. 05/27/2023 Medical Record Number: [id number] Patient Account Number: [id number] Date of Birth/Sex: Treating RN: Jan 30, 1933 (89 y.o. Natalie [name] Primary Care Provider: Amon Schanz Other Clinician: Referring Provider: Treating Provider/Extender: Bethena Ferraris Amon Schanz Weeks in Treatment: 3 Diagnosis Coding ICD-10 Codes Code Description [phone] Type 2 diabetes mellitus with foot ulcer L97.512 Non-pressure chronic ulcer of other part of right foot with fat layer exposed I10 Essential (primary) hypertension Facility Procedures : CPT4 Code: [id number] Description: 11042 - DEB SUBQ TISSUE 20 SQ CM/< ICD-10 Diagnosis Description L97.512 Non-pressure chronic ulcer of other part of right foot with fat layer exposed Modifier: Quantity: 1 Physician Procedures : CPT4 Code Description Modifier 11042 11042 - WC PHYS SUBQ TISS 20 SQ CM ICD-10 Diagnosis Description L97.512 Non-pressure chronic ulcer of other part of right foot with fat layer exposed Quantity: 1 Electronic Signature(s) Signed: 05/27/2023 12:42:53 PM By: Bethena Ferraris PA-C Entered By: Bethena Ferraris on 05/27/2023 12:42:53

## 2023-05-27 NOTE — Progress Notes (Signed)
 Natalie [name] [name] ([id number]) [id number] Page 1 of 8 Visit Report for 05/27/2023 Arrival Information Details Patient Name: Date of Service: Natalie [name]. 05/27/2023 9:45 A M Medical Record Number: [id number] Patient Account Number: [id number] Date of Birth/Sex: Treating RN: 1932/12/15 (89 y.o. Natalie [name] Primary Care Jamariah Tony: Amon Schanz Other Clinician: Referring Lilian Fuhs: Treating Tashara Suder/Extender: Bethena Andre Amon Schanz Weeks in Treatment: 3 Visit Information History Since Last Visit Added or deleted any medications: No Patient Arrived: [name] Any new allergies or adverse reactions: No Arrival Time: 09:23 Had a fall or experienced change in No Accompanied By: daughter in law activities of daily living that may affect Transfer Assistance: None risk of falls: Patient Identification Verified: Yes Signs or symptoms of abuse/neglect since last visito No Secondary Verification Process Completed: Yes Hospitalized since last visit: No Patient Requires Transmission-Based Precautions: No Has Dressing in Place as Prescribed: Yes Patient Has Alerts: Yes Pain Present Now: No Patient Alerts: Type 2 diabetic Electronic Signature(s) Signed: 05/27/2023 12:05:56 PM By: [name] MSN RN CNS WTA Entered By: [name] on 05/27/2023 06:30:00 -------------------------------------------------------------------------------- Clinic Level of Care Assessment Details Patient Name: Date of Service: Natalie [name]. 05/27/2023 9:45 A M Medical Record Number: [id number] Patient Account Number: [id number] Date of Birth/Sex: Treating RN: 20-Jul-1932 (89 y.o. Natalie [name] Primary Care Jana Swartzlander: Amon Schanz Other Clinician: Referring Crit Obremski: Treating Peityn Payton/Extender: Bethena Andre Amon Schanz Weeks in Treatment: 3 Clinic Level of Care Assessment Items TOOL 1 Quantity Score []  - 0 Use when EandM and Procedure is performed on INITIAL visit ASSESSMENTS -  Nursing Assessment / Reassessment []  - 0 General Physical Exam (combine w/ comprehensive assessment (listed just below) when performed on new pt. evals) []  - 0 Comprehensive Assessment (HX, ROS, Risk Assessments, Wounds Hx, etc.) ASSESSMENTS - Wound and Skin Assessment / Reassessment []  - 0 Dermatologic / Skin Assessment (not related to wound area) Natalie [name] [name] ([id number]) [id number] Page 2 of 8 ASSESSMENTS - Ostomy and/or Continence Assessment and Care []  - 0 Incontinence Assessment and Management []  - 0 Ostomy Care Assessment and Management (repouching, etc.) PROCESS - Coordination of Care []  - 0 Simple Patient / Family Education for ongoing care []  - 0 Complex (extensive) Patient / Family Education for ongoing care []  - 0 Staff obtains Chiropractor, Records, [name] Results / Process Orders est []  - 0 Staff telephones HHA, Nursing Homes / Clarify orders / etc []  - 0 Routine Transfer to another Facility (non-emergent condition) []  - 0 Routine Hospital Admission (non-emergent condition) []  - 0 New Admissions / Manufacturing Engineer / Ordering NPWT Apligraf, etc. , []  - 0 Emergency Hospital Admission (emergent condition) PROCESS - Special Needs []  - 0 Pediatric / Minor Patient Management []  - 0 Isolation Patient Management []  - 0 Hearing / Language / Visual special needs []  - 0 Assessment of Community assistance (transportation, D/C planning, etc.) []  - 0 Additional assistance / Altered mentation []  - 0 Support Surface(s) Assessment (bed, cushion, seat, etc.) INTERVENTIONS - Miscellaneous []  - 0 External ear exam []  - 0 Patient Transfer (multiple staff / Nurse, Adult / Similar devices) []  - 0 Simple Staple / Suture removal (25 or less) []  - 0 Complex Staple / Suture removal (26 or more) []  - 0 Hypo/Hyperglycemic Management (do not check if billed separately) []  - 0 Ankle / Brachial Index (ABI) - do not check if billed separately Has  the patient been seen at the hospital within the last three years: Yes Total Score: 0  Level Of Care: ____ Electronic Signature(s) Signed: 05/27/2023 12:05:56 PM By: [name] MSN RN CNS WTA Entered By: [name] on 05/27/2023 07:26:58 -------------------------------------------------------------------------------- Encounter Discharge Information Details Patient Name: Date of Service: Natalie [name], Natalie [name]. 05/27/2023 9:45 A M Medical Record Number: [id number] Patient Account Number: [id number] Date of Birth/Sex: Treating RN: [birth year] (89 y.o. Natalie [name] Primary Care Nixon Sparr: Amon Schanz Other Clinician: Referring Janequa Kipnis: Treating Layal Javid/Extender: Bethena Andre Amon Schanz Weeks in Treatment: 3 Encounter Discharge Information Items Post Procedure Natalie [name], Natalie [name] ([id number]) [id number] Page 3 of 8 Discharge Condition: Stable Temperature (F): 97.8 Ambulatory Status: Walker Pulse (bpm): 84 Discharge Destination: Home Respiratory Rate (breaths/min): 18 Transportation: Private Auto Blood Pressure (mmHg): 163/75 Accompanied By: daughter in law Schedule Follow-up Appointment: Yes Clinical Summary of Care: Electronic Signature(s) Signed: 05/27/2023 12:05:24 PM By: [name] MSN RN CNS WTA Entered By: [name] on 05/27/2023 09:05:24 -------------------------------------------------------------------------------- Lower Extremity Assessment Details Patient Name: Date of Service: Natalie [name] Natalie [name] [name]. 05/27/2023 9:45 A M Medical Record Number: [id number] Patient Account Number: [id number] Date of Birth/Sex: Treating RN: 1932/08/07 (89 y.o. Natalie [name] Primary Care Nareh Matzke: Amon Schanz Other Clinician: Referring Eulalia Ellerman: Treating Camy Leder/Extender: Bethena Andre Amon Schanz Weeks in Treatment: 3 Electronic Signature(s) Signed: 05/27/2023 12:05:56 PM By: [name] MSN RN CNS WTA Entered By: [name] on 05/27/2023  06:38:13 -------------------------------------------------------------------------------- Multi Wound Chart Details Patient Name: Date of Service: Natalie [name], Natalie [name]. 05/27/2023 9:45 A M Medical Record Number: [id number] Patient Account Number: [id number] Date of Birth/Sex: Treating RN: 03/27/1933 (89 y.o. Natalie [name] Primary Care Renatta Shrieves: Amon Schanz Other Clinician: Referring Tai Skelly: Treating Krishay Faro/Extender: Bethena Andre Amon Schanz Weeks in Treatment: 3 Vital Signs Height(in): 66 Pulse(bpm): 84 Weight(lbs): 176 Blood Pressure(mmHg): 163/75 Body Mass Index(BMI): 28.4 Temperature(F): 97.8 Respiratory Rate(breaths/min): 18 [1:Photos:] [N/A:N/A] Right [name] Great oe N/A N/A Wound Location: Gradually Appeared N/A N/A Wounding Event: Diabetic Wound/Ulcer of the Lower N/A N/A Primary Etiology: Extremity Chronic sinus problems/congestion, N/A N/A Comorbid History: Asthma, Hypertension, Type II Diabetes 12/15/2022 N/A N/A Date Acquired: 3 N/A N/A Weeks of Treatment: Open N/A N/A Wound Status: No N/A N/A Wound Recurrence: 0.5x0.4x0.1 N/A N/A Measurements L x W x D (cm) 0.157 N/A N/A A (cm) : rea 0.016 N/A N/A Volume (cm) : -33.10% N/A N/A % Reduction in A rea: 66.00% N/A N/A % Reduction in Volume: Grade 2 N/A N/A Classification: Medium N/A N/A Exudate A mount: Serosanguineous N/A N/A Exudate Type: red, brown N/A N/A Exudate Color: Medium (34-66%) N/A N/A Granulation A mount: Red, Pink N/A N/A Granulation Quality: Medium (34-66%) N/A N/A Necrotic A mount: Fat Layer (Subcutaneous Tissue): Yes N/A N/A Exposed Structures: None N/A N/A Epithelialization: Treatment Notes Electronic Signature(s) Signed: 05/27/2023 12:05:56 PM By: [name] MSN RN CNS WTA Entered By: [name] on 05/27/2023 07:18:07 -------------------------------------------------------------------------------- Multi-Disciplinary Care Plan Details Patient Name: Date of Service: Natalie [name], Natalie [name]. 05/27/2023 9:45 A M Medical Record Number: [id number] Patient Account Number: [id number] Date of Birth/Sex: Treating RN: Oct 20, 1932 (89 y.o. Natalie [name] Primary Care Gregary Blackard: Amon Schanz Other Clinician: Referring Ozan Maclay: Treating Joyell Emami/Extender: Bethena Andre Amon Schanz Weeks in Treatment: 3 Active Inactive Necrotic Tissue Nursing Diagnoses: Impaired tissue integrity related to necrotic/devitalized tissue Knowledge deficit related to management of necrotic/devitalized tissue Goals: Necrotic/devitalized tissue will be minimized in the wound bed Date Initiated: 05/02/2023 Target Resolution Date: 06/27/2023 Goal Status: Active Patient/caregiver will verbalize understanding of reason and process for debridement of necrotic tissue Date Initiated: 05/02/2023  Date Inactivated: 05/02/2023 Target Resolution Date: 05/02/2023 Natalie [name], Natalie [name] ([id number]) [phone].pdf Page 5 of 8 Goal Status: Met Interventions: Assess patient pain level pre-, during and post procedure and prior to discharge Provide education on necrotic tissue and debridement process Treatment Activities: Apply topical anesthetic as ordered : 05/02/2023 Excisional debridement : 05/02/2023 Notes: Wound/Skin Impairment Nursing Diagnoses: Impaired tissue integrity Goals: Ulcer/skin breakdown will have a volume reduction of 30% by week 4 Date Initiated: 05/02/2023 Date Inactivated: 05/27/2023 Target Resolution Date: 05/30/2023 Goal Status: Met Ulcer/skin breakdown will have a volume reduction of 50% by week 8 Date Initiated: 05/02/2023 Target Resolution Date: 06/27/2023 Goal Status: Active Ulcer/skin breakdown will have a volume reduction of 80% by week 12 Date Initiated: 05/02/2023 Target Resolution Date: 07/25/2023 Goal Status: Active Ulcer/skin breakdown will heal within 14 weeks Date Initiated: 05/02/2023 Target Resolution Date: 08/08/2023 Goal Status:  Active Interventions: Assess patient/caregiver ability to obtain necessary supplies Assess patient/caregiver ability to perform ulcer/skin care regimen upon admission and as needed Assess ulceration(s) every visit Provide education on ulcer and skin care Notes: Electronic Signature(s) Signed: 05/27/2023 9:47:10 AM By: [name] MSN RN CNS WTA Entered By: [name] on 05/27/2023 06:47:10 -------------------------------------------------------------------------------- Pain Assessment Details Patient Name: Date of Service: Natalie [name]. 05/27/2023 9:45 A M Medical Record Number: [id number] Patient Account Number: [id number] Date of Birth/Sex: Treating RN: Sep 12, 1932 (89 y.o. Natalie [name] Primary Care Dilynn Munroe: Amon Schanz Other Clinician: Referring Angelino Rumery: Treating Kwynn Schlotter/Extender: Bethena Andre Amon Schanz Weeks in Treatment: 3 Active Problems Location of Pain Severity and Description of Pain Patient Has Paino No Site Locations Natalie [name], Natalie [name] ([id number]) [id number] Page 6 of 8 Pain Management and Medication Current Pain Management: Electronic Signature(s) Signed: 05/27/2023 12:05:56 PM By: [name] MSN RN CNS WTA Entered By: [name] on 05/27/2023 06:34:34 -------------------------------------------------------------------------------- Patient/Caregiver Education Details Patient Name: Date of Service: Natalie LYNDA, Natalie [name]. 1/10/2025andnbsp9:45 A M Medical Record Number: [id number] Patient Account Number: [id number] Date of Birth/Gender: Treating RN: 1932/09/23 (89 y.o. Natalie [name] Primary Care Physician: Amon Schanz Other Clinician: Referring Physician: Treating Physician/Extender: Bethena Andre Amon Schanz Devra in Treatment: 3 Education Assessment Education Provided To: Patient Education Topics Provided Wound Debridement: Handouts: Wound Debridement Methods: Explain/Verbal Responses: State content correctly Wound/Skin  Impairment: Handouts: Caring for Your Ulcer Methods: Explain/Verbal Responses: State content correctly Electronic Signature(s) Signed: 05/27/2023 12:05:56 PM By: [name] MSN RN CNS WTA Entered By: [name] on 05/27/2023 06:47:38 Natalie [name] ([id number]) [id number] Page 7 of 8 -------------------------------------------------------------------------------- Wound Assessment Details Patient Name: Date of Service: Natalie [name]. 05/27/2023 9:45 A M Medical Record Number: [id number] Patient Account Number: [id number] Date of Birth/Sex: Treating RN: June 10, 1932 (89 y.o. Natalie [name] Primary Care Warda Mcqueary: Amon Schanz Other Clinician: Referring Apolonia Ellwood: Treating Kaylynne Andres/Extender: Bethena Andre Amon Schanz Weeks in Treatment: 3 Wound Status Wound Number: 1 Primary Diabetic Wound/Ulcer of the Lower Extremity Etiology: Wound Location: Right [name] Great oe Wound Status: Open Wounding Event: Gradually Appeared Comorbid Chronic sinus problems/congestion, Asthma, Hypertension, Date Acquired: 12/15/2022 History: Type II Diabetes Weeks Of Treatment: 3 Clustered Wound: No Photos Wound Measurements Length: (cm) 0.2 Width: (cm) 0.2 Depth: (cm) 0.2 Area: (cm) 0.031 Volume: (cm) 0.006 % Reduction in Area: 73.7% % Reduction in Volume: 87.2% Epithelialization: None Wound Description Classification: Grade 2 Exudate Amount: Medium Exudate Type: Serosanguineous Exudate Color: red, brown Foul Odor After Cleansing: No Slough/Fibrino Yes Wound Bed Granulation Amount: Medium (34-66%) Exposed Structure Granulation Quality: Red, Pink Fat Layer (Subcutaneous Tissue) Exposed:  Yes Necrotic Amount: Medium (34-66%) Treatment Notes Wound #1 (Toe Great) Wound Laterality: Right Cleanser Vashe 5.8 (oz) Discharge Instruction: Use vashe 5.8 (oz) as directed Wound Cleanser Discharge Instruction: Wash your hands with soap and water. Remove old dressing, discard  into plastic bag and place into trash. Cleanse the wound with Wound Cleanser prior to applying a clean dressing using gauze sponges, not tissues or cotton balls. Do not scrub or use excessive force. Pat dry using gauze sponges, not tissue or cotton balls. Natalie [name], Natalie [name] ([id number]) [id number] Page 8 of 8 Peri-Wound Care Topical Primary Dressing Prisma 4.34 (in) Discharge Instruction: Moisten w/normal saline or sterile water; Cover wound as directed. Do not remove from wound bed. Xeroform-HBD 2x2 (in/in) Discharge Instruction: Apply Xeroform-HBD 2x2 (in/in) as directed Secondary Dressing Coverlet Latex-Free Fabric Adhesive Dressings Discharge Instruction: Knuckle Secured With Compression Wrap Compression Stockings Add-Ons Electronic Signature(s) Signed: 05/27/2023 12:05:56 PM By: [name] MSN RN CNS WTA Entered By: [name] on 05/27/2023 09:04:58 -------------------------------------------------------------------------------- Vitals Details Patient Name: Date of Service: Natalie [name], Natalie [name]. 05/27/2023 9:45 A M Medical Record Number: [id number] Patient Account Number: [id number] Date of Birth/Sex: Treating RN: 1932-11-01 (89 y.o. Natalie [name] Primary Care Soyla Bainter: Amon Schanz Other Clinician: Referring Raliyah Montella: Treating Jacquiline Zurcher/Extender: Bethena Andre Amon Schanz Weeks in Treatment: 3 Vital Signs Time Taken: 09:34 Temperature (F): 97.8 Height (in): 66 Pulse (bpm): 84 Weight (lbs): 176 Respiratory Rate (breaths/min): 18 Body Mass Index (BMI): 28.4 Blood Pressure (mmHg): 163/75 Reference Range: 80 - 120 mg / dl Electronic Signature(s) Signed: 05/27/2023 12:05:56 PM By: [name] MSN RN CNS WTA Entered By: [name] on 05/27/2023 93:65:75

## 2023-05-30 DIAGNOSIS — M48061 Spinal stenosis, lumbar region without neurogenic claudication: Secondary | ICD-10-CM | POA: Diagnosis not present

## 2023-05-30 DIAGNOSIS — Z79891 Long term (current) use of opiate analgesic: Secondary | ICD-10-CM | POA: Diagnosis not present

## 2023-05-30 DIAGNOSIS — M51362 Other intervertebral disc degeneration, lumbar region with discogenic back pain and lower extremity pain: Secondary | ICD-10-CM | POA: Diagnosis not present

## 2023-06-02 ENCOUNTER — Ambulatory Visit: Payer: Medicare PPO | Admitting: Physician Assistant

## 2023-06-03 ENCOUNTER — Encounter: Payer: Medicare PPO | Admitting: Physician Assistant

## 2023-06-03 DIAGNOSIS — L97512 Non-pressure chronic ulcer of other part of right foot with fat layer exposed: Secondary | ICD-10-CM | POA: Diagnosis not present

## 2023-06-03 DIAGNOSIS — J45909 Unspecified asthma, uncomplicated: Secondary | ICD-10-CM | POA: Diagnosis not present

## 2023-06-03 DIAGNOSIS — I10 Essential (primary) hypertension: Secondary | ICD-10-CM | POA: Diagnosis not present

## 2023-06-03 DIAGNOSIS — E11621 Type 2 diabetes mellitus with foot ulcer: Secondary | ICD-10-CM | POA: Diagnosis not present

## 2023-06-03 DIAGNOSIS — E1151 Type 2 diabetes mellitus with diabetic peripheral angiopathy without gangrene: Secondary | ICD-10-CM | POA: Diagnosis not present

## 2023-06-03 NOTE — Progress Notes (Signed)
Natalie [name] ([id number]) [id number] Page 1 of 7 Visit Report for 06/03/2023 Chief Complaint Document Details Patient Name: Date of Service: Natalie [name] NNIE [name]. 06/03/2023 9:45 A M Medical Record Number: [id number] Patient Account Number: [id number] Date of Birth/Sex: Treating RN: 1932-09-20 (89 y.o. Natalie [name] Primary Care Provider: Willow Ora Other Clinician: Referring Provider: Treating Provider/Extender: Oneita Kras Weeks in Treatment: 4 Information Obtained from: Patient Chief Complaint Right great toe ulcer Electronic Signature(s) Signed: 06/03/2023 10:11:00 AM By: Allen Derry PA-C Entered By: Allen Derry on 06/03/2023 10:11:00 -------------------------------------------------------------------------------- HPI Details Patient Name: Date of Service: Natalie [name], FA NNIE [name]. 06/03/2023 9:45 A M Medical Record Number: [id number] Patient Account Number: [id number] Date of Birth/Sex: Treating RN: 1932-06-14 (89 y.o. Natalie [name] Primary Care Provider: Willow Ora Other Clinician: Referring Provider: Treating Provider/Extender: Oneita Kras Weeks in Treatment: 4 History of Present Illness HPI Description: 05-02-2023 upon evaluation today patient presents for initial evaluation in the clinic although this is an individual whom I am seeing previously when I was working with pain management although that has been about 8 years ago. Fortunately I do not see any signs of active infection at this time which is great news. With that being said this wound has been present for about 4 months. She is not sure if she had an ingrown toenail that she was clipping in that area when something happened or not it has honestly been so far removed she is unsure. She has been seeing podiatry at [facility] and subsequently they have been treating this although she tells me that most recently they have been using mupirocin and they have been  trying to keep her from getting this wet. Subsequently she has been on doxycycline though I do not believe she is on anything right now she tells me this has been infected a couple times with the course of treatment thus far. Her most notable change that she is seeing is when she is wearing the offloading postop shoe this seems to be better for her than using just a regular shoe which is when it reoccurred when she thought she had it healed although I think this may have just been more of a callusing over. I am unsure however as I was not involved in her care during that time. Podiatry has ordered new custom shoes but they are not in yet. Patient does have a history of diabetes mellitus type 2 most recent hemoglobin A1c on 02-21-2023 was 6.1. She also tells me that she has a history with hypertension this seems to be very well-controlled for the most part. Otherwise no major medical problems in general. 05-09-2023 upon evaluation today patient actually appears to be doing better in regard to her toe to some degree. I am actually very pleased with where where we stand I think the endoform however is drying out too much and not being productive for her as far as the toe is concerned. I think we may want to switch to Prisma this may be a better option. LISABELLA, PAO [name] ([id number]) [id number] Page 2 of 7 12/31; patient has a punched-out wound on the tip of her right great toe. She is a diabetic. An x-ray is been done but not reported as of yet. I have not looked at this. We have been using Prisma Xeroform and gauze wrap. I believe the patient's daughter-in-law's changing the dressing 05-27-23 upon evaluation today patient appears to be doing well currently in regard  to her wound. She has been tolerating the dressing changes she is going require some debridement today and I discussed that with her it is also noted that she is having a lot of issues with the collagen drying out. I  think there may be some techniques that can be initiated here to prevent that from happening. 06-03-2023 upon evaluation today patient appears to be doing well currently in regard to her toe ulcer. This is a little bit wet compared to what I would like to see. I do believe that she likely needs to be avoiding the Xeroform at this point I did recommend that the patient go get her let her caregiver know he was taking care of the wound. She was not here today but nonetheless will relay the message that she wrote it down for her. Otherwise we will continue to put the dressing on exactly as we were doing other than the Xeroform. Electronic Signature(s) Signed: 06/03/2023 12:42:28 PM By: Allen Derry PA-C Entered By: Allen Derry on 06/03/2023 12:42:28 -------------------------------------------------------------------------------- Physical Exam Details Patient Name: Date of Service: A Natalie [name], FA NNIE [name]. 06/03/2023 9:45 A M Medical Record Number: [id number] Patient Account Number: [id number] Date of Birth/Sex: Treating RN: [birth year] (89 y.o. Natalie [name] Primary Care Provider: Willow Ora Other Clinician: Referring Provider: Treating Provider/Extender: Oneita Kras Weeks in Treatment: 4 Constitutional Well-nourished and well-hydrated in no acute distress. Respiratory normal breathing without difficulty. Psychiatric this patient is able to make decisions and demonstrates good insight into disease process. Alert and Oriented x 3. pleasant and cooperative. Notes Patient's wound currently did not require any sharp debridement. She seems to be doing quite well I do not see any signs of infection which is great news and overall I am very pleased with where we stand. Electronic Signature(s) Signed: 06/03/2023 12:42:42 PM By: Allen Derry PA-C Entered By: Allen Derry on 06/03/2023 12:42:42 -------------------------------------------------------------------------------- Physician Orders  Details Patient Name: Date of Service: Natalie [name], FA NNIE [name]. 06/03/2023 9:45 A M Medical Record Number: [id number] Patient Account Number: [id number] Date of Birth/Sex: Treating RN: 1932/10/12 (89 y.o. Natalie [name] Primary Care Provider: Willow Ora Other Clinician: [name] ([id number]) [id number] Page 3 of 7 Referring Provider: Treating Provider/Extender: Rojelio Brenner, Elita Quick Weeks in Treatment: 4 The following information was scribed by: Midge Aver The information was scribed for: Allen Derry Verbal / Phone Orders: No Diagnosis Coding ICD-10 Coding Code Description E11.621 Type 2 diabetes mellitus with foot ulcer L97.512 Non-pressure chronic ulcer of other part of right foot with fat layer exposed I10 Essential (primary) hypertension Follow-up Appointments Return Appointment in 1 week. Bathing/ Shower/ Hygiene May shower; gently cleanse wound with antibacterial soap, rinse and pat dry prior to dressing wounds - Shower on days you change the dressing, take your shower with dressing on, then at end of shower take dressing off, clean with Dial original GOLD soap, dry and apply a new dressing. May shower with wound dressing protected with water repellent cover or cast protector. - If showering on days you are not changing dressing, you will need to protect area from becoming wet. No tub bath. Anesthetic (Use 'Patient Medications' Section for Anesthetic Order Entry) Lidocaine applied to wound bed Edema Control - Orders / Instructions Elevate, Exercise Daily and A void Standing for Long Periods of Time. Elevate legs to the level of the heart and pump ankles as often as possible Elevate leg(s) parallel to the floor when sitting. DO YOUR BEST to sleep in the  bed at night. DO NOT sleep in your recliner. Long hours of sitting in a recliner leads to swelling of the legs and/or potential wounds on your backside. Wound Treatment Wound #1 - [name]  Great oe Wound Laterality: Right Cleanser: Vashe 5.8 (oz) 3 x Per Week/15 Days Discharge Instructions: Use vashe 5.8 (oz) as directed Cleanser: Wound Cleanser 3 x Per Week/15 Days Discharge Instructions: Wash your hands with soap and water. Remove old dressing, discard into plastic bag and place into trash. Cleanse the wound with Wound Cleanser prior to applying a clean dressing using gauze sponges, not tissues or cotton balls. Do not scrub or use excessive force. Pat dry using gauze sponges, not tissue or cotton balls. Prim Dressing: Prisma 4.34 (in) 3 x Per Week/15 Days ary Discharge Instructions: Moisten w/normal saline or sterile water; Cover wound as directed. Do not remove from wound bed. Secondary Dressing: Coverlet Latex-Free Fabric Adhesive Dressings 3 x Per Week/15 Days Discharge Instructions: Knuckle Electronic Signature(s) Signed: 06/03/2023 12:54:04 PM By: Allen Derry PA-C Signed: 06/07/2023 5:12:31 PM By: Midge Aver MSN RN CNS WTA Entered By: Midge Aver on 06/03/2023 11:03:04 -------------------------------------------------------------------------------- Problem List Details Patient Name: Date of Service: Natalie [name], FA NNIE [name]. 06/03/2023 9:45 A M Medical Record Number: [id number] Patient Account Number: [id number] Date of Birth/Sex: Treating RN: 1932/10/15 (89 y.o. Syesha, [place], [place] [name] ([id number]) [id number] Page 4 of 7 Primary Care Provider: Willow Ora Other Clinician: Referring Provider: Treating Provider/Extender: Oneita Kras Weeks in Treatment: 4 Active Problems ICD-10 Encounter Code Description Active Date MDM Diagnosis E11.621 Type 2 diabetes mellitus with foot ulcer 05/02/2023 No Yes L97.512 Non-pressure chronic ulcer of other part of right foot with fat layer exposed 05/02/2023 No Yes I10 Essential (primary) hypertension 05/02/2023 No Yes Inactive Problems Resolved Problems Electronic  Signature(s) Signed: 06/03/2023 10:10:55 AM By: Allen Derry PA-C Entered By: Allen Derry on 06/03/2023 10:10:55 -------------------------------------------------------------------------------- Progress Note Details Patient Name: Date of Service: Natalie [name], FA NNIE [name]. 06/03/2023 9:45 A M Medical Record Number: [id number] Patient Account Number: [id number] Date of Birth/Sex: Treating RN: 1932-11-24 (89 y.o. Natalie [name] Primary Care Provider: Willow Ora Other Clinician: Referring Provider: Treating Provider/Extender: Oneita Kras Weeks in Treatment: 4 Subjective Chief Complaint Information obtained from Patient Right great toe ulcer History of Present Illness (HPI) 05-02-2023 upon evaluation today patient presents for initial evaluation in the clinic although this is an individual whom I am seeing previously when I was working with pain management although that has been about 8 years ago. Fortunately I do not see any signs of active infection at this time which is great news. With that being said this wound has been present for about 4 months. She is not sure if she had an ingrown toenail that she was clipping in that area when something happened or not it has honestly been so far removed she is unsure. She has been seeing podiatry at [facility] and subsequently they have been treating this although she tells me that most recently they have been using mupirocin and they have been trying to keep her from getting this wet. Subsequently she has been on doxycycline though I do not believe she is on anything right now she tells me this has been infected a couple times with the course of treatment thus far. Her most notable change that she is seeing is when she is wearing the offloading postop shoe this seems to be better for her than using just a regular  shoe which is when it reoccurred when she thought she had it healed although I think this may have just been more of a callusing  over. I am unsure however as I was not involved in her care during that time. Podiatry has ordered new custom shoes but they are not in yet. Patient does have a history of diabetes mellitus type 2 most recent hemoglobin A1c on 02-21-2023 was 6.1. She also tells me that she has a history with hypertension this seems to be very well-controlled for the most part. Otherwise no major medical problems in general. 05-09-2023 upon evaluation today patient actually appears to be doing better in regard to her toe to some degree. I am actually very pleased with where where we stand I think the endoform however is drying out too much and not being productive for her as far as the toe is concerned. I think we may want to switch to Prisma this may be a better option. TELENA, SCHNAPP [name] ([id number]) [id number] Page 5 of 7 12/31; patient has a punched-out wound on the tip of her right great toe. She is a diabetic. An x-ray is been done but not reported as of yet. I have not looked at this. We have been using Prisma Xeroform and gauze wrap. I believe the patient's daughter-in-law's changing the dressing 05-27-23 upon evaluation today patient appears to be doing well currently in regard to her wound. She has been tolerating the dressing changes she is going require some debridement today and I discussed that with her it is also noted that she is having a lot of issues with the collagen drying out. I think there may be some techniques that can be initiated here to prevent that from happening. 06-03-2023 upon evaluation today patient appears to be doing well currently in regard to her toe ulcer. This is a little bit wet compared to what I would like to see. I do believe that she likely needs to be avoiding the Xeroform at this point I did recommend that the patient go get her let her caregiver know he was taking care of the wound. She was not here today but nonetheless will relay the message that  she wrote it down for her. Otherwise we will continue to put the dressing on exactly as we were doing other than the Xeroform. Objective Constitutional Well-nourished and well-hydrated in no acute distress. Vitals Time Taken: 10:35 AM, Height: 66 in, Weight: 176 lbs, BMI: 28.4, Temperature: 98.1 F, Pulse: 83 bpm, Respiratory Rate: 18 breaths/min, Blood Pressure: 165/85 mmHg. Respiratory normal breathing without difficulty. Psychiatric this patient is able to make decisions and demonstrates good insight into disease process. Alert and Oriented x 3. pleasant and cooperative. General Notes: Patient's wound currently did not require any sharp debridement. She seems to be doing quite well I do not see any signs of infection which is great news and overall I am very pleased with where we stand. Integumentary (Hair, Skin) Wound #1 status is Open. Original cause of wound was Gradually Appeared. The date acquired was: 12/15/2022. The wound has been in treatment 4 weeks. The wound is located on the Right [name] Great. The wound measures 0.2cm length x 0.2cm width x 0.1cm depth; 0.031cm^2 area and 0.003cm^3 volume. There is Fat oe Layer (Subcutaneous Tissue) exposed. There is a medium amount of serosanguineous drainage noted. There is medium (34-66%) red, pink granulation within the wound bed. There is a medium (34-66%) amount of necrotic tissue within the wound bed. Assessment  Active Problems ICD-10 Type 2 diabetes mellitus with foot ulcer Non-pressure chronic ulcer of other part of right foot with fat layer exposed Essential (primary) hypertension Plan Follow-up Appointments: Return Appointment in 1 week. Bathing/ Shower/ Hygiene: May shower; gently cleanse wound with antibacterial soap, rinse and pat dry prior to dressing wounds - Shower on days you change the dressing, take your shower with dressing on, then at end of shower take dressing off, clean with Dial original GOLD soap, dry and apply a new  dressing. May shower with wound dressing protected with water repellent cover or cast protector. - If showering on days you are not changing dressing, you will need to protect area from becoming wet. No tub bath. Anesthetic (Use 'Patient Medications' Section for Anesthetic Order Entry): Lidocaine applied to wound bed Edema Control - Orders / Instructions: Elevate, Exercise Daily and Avoid Standing for Long Periods of Time. Elevate legs to the level of the heart and pump ankles as often as possible Elevate leg(s) parallel to the floor when sitting. DO YOUR BEST to sleep in the bed at night. DO NOT sleep in your recliner. Long hours of sitting in a recliner leads to swelling of the legs and/or potential wounds on your backside. WOUND #1: - [name] Great Wound Laterality: Right oe Cleanser: Vashe 5.8 (oz) 3 x Per Week/15 Days Discharge Instructions: Use vashe 5.8 (oz) as directed Cleanser: Wound Cleanser 3 x Per Week/15 Days Discharge Instructions: Wash your hands with soap and water. Remove old dressing, discard into plastic bag and place into trash. Cleanse the wound with Wound Cleanser prior to applying a clean dressing using gauze sponges, not tissues or cotton balls. Do not scrub or use excessive force. Pat dry using gauze sponges, not tissue or cotton balls. Prim Dressing: Prisma 4.34 (in) 3 x Per Week/15 Days ary ADISEN, HUTLEY [name] ([id number]) [id number] Page 6 of 7 Discharge Instructions: Moisten w/normal saline or sterile water; Cover wound as directed. Do not remove from wound bed. Secondary Dressing: Coverlet Latex-Free Fabric Adhesive Dressings 3 x Per Week/15 Days Discharge Instructions: Knuckle 1. I would recommend that we have the patient continue to utilize the collagen moistened packed into the base of the wound. 2. I am going to recommend continue with the Band-Aid to cover. 3. And also can recommend the patient should continue with the Band-Aid to  cover which I think is doing well without the Xeroform I think it should keep it from becoming too wet. We will see patient back for reevaluation in 1 week here in the clinic. If anything worsens or changes patient will contact our office for additional recommendations. Electronic Signature(s) Signed: 06/03/2023 12:43:22 PM By: Allen Derry PA-C Entered By: Allen Derry on 06/03/2023 12:43:22 -------------------------------------------------------------------------------- SuperBill Details Patient Name: Date of Service: Natalie [name], FA NNIE [name]. 06/03/2023 Medical Record Number: [id number] Patient Account Number: [id number] Date of Birth/Sex: Treating RN: Jun 04, 1932 (89 y.o. Natalie [name] Primary Care Provider: Willow Ora Other Clinician: Referring Provider: Treating Provider/Extender: Oneita Kras Weeks in Treatment: 4 Diagnosis Coding ICD-10 Codes Code Description [phone] Type 2 diabetes mellitus with foot ulcer L97.512 Non-pressure chronic ulcer of other part of right foot with fat layer exposed I10 Essential (primary) hypertension Facility Procedures : CPT4 Code: [id number] Description: 99213 - WOUND CARE VISIT-LEV 3 EST PT Modifier: Quantity: 1 Physician Procedures : CPT4: Description Modifier Code [id number] 99213 - WC PHYS LEVEL 3 - EST PT ICD-10 Diagnosis Description E11.621 Type 2 diabetes mellitus with foot ulcer L97.512 Non-pressure  chronic ulcer of other part of right foot with fat layer exposed I10 Essential  (primary) hypertension Quantity: 1 : CPT4: [id number] Visit complexity inherent to [facility] w/medical care services that serve as the continuing focal point for ongoing care related to a patient's condition ICD-10 Diagnosis Description E11.621 Type 2 diabetes mellitus with foot ulcer L97.512  Non-pressure chronic ulcer of other part of right foot with fat layer exposed I10 Essential (primary) hypertension Quantity: 1 Electronic Signature(s) PHUNG, CHILDRESS [name]  ([id number]) [id number] Page 7 of 7 Signed: 06/03/2023 12:43:58 PM By: Allen Derry PA-C Entered By: Allen Derry on 06/03/2023 12:43:58

## 2023-06-03 NOTE — Progress Notes (Addendum)
HELMI, LASCALA [name] ([id number]) [id number] Page 1 of 9 Visit Report for 06/03/2023 Arrival Information Details Patient Name: Date of Service: Natalie [name]. 06/03/2023 9:45 A M Medical Record Number: [id number] Patient Account Number: [id number] Date of Birth/Sex: Treating RN: [birth year] (89 y.o. Natalie [name] Primary Care Jessika Rothery: Willow Ora Other Clinician: Referring Kaidyn Javid: Treating Darryl Blumenstein/Extender: Oneita Kras Weeks in Treatment: 4 Visit Information History Since Last Visit Added or deleted any medications: No Patient Arrived: Natalie [name] Any new allergies or adverse reactions: No Arrival Time: 10:29 Had a fall or experienced change in No Accompanied By: son activities of daily living that may affect Transfer Assistance: None risk of falls: Patient Identification Verified: Yes Signs or symptoms of abuse/neglect since last visito No Secondary Verification Process Completed: Yes Hospitalized since last visit: No Patient Requires Transmission-Based Precautions: No Has Dressing in Place as Prescribed: Yes Patient Has Alerts: Yes Pain Present Now: No Patient Alerts: Type 2 diabetic Electronic Signature(s) Signed: 06/07/2023 5:12:31 PM By: Midge Aver MSN RN CNS WTA Entered By: Midge Aver on 06/03/2023 10:32:43 -------------------------------------------------------------------------------- Clinic Level of Care Assessment Details Patient Name: Date of Service: Natalie [name]. 06/03/2023 9:45 A M Medical Record Number: [id number] Patient Account Number: [id number] Date of Birth/Sex: Treating RN: [birth year] (89 y.o. Natalie [name] Primary Care Alvon Nygaard: Willow Ora Other Clinician: Referring Stevenson Windmiller: Treating Ellanie Oppedisano/Extender: Oneita Kras Weeks in Treatment: 4 Clinic Level of Care Assessment Items TOOL 4 Quantity Score X- 1 0 Use when only an EandM is performed on FOLLOW-UP visit ASSESSMENTS - Nursing Assessment /  Reassessment X- 1 10 Reassessment of Co-morbidities (includes updates in patient status) X- 1 5 Reassessment of Adherence to Treatment Plan ASSESSMENTS - Wound and Skin A ssessment / Reassessment X - Simple Wound Assessment / Reassessment - one wound 1 5 CAMREY, CAIRNES [name] ([id number]) [id number] Page 2 of 9 []  - 0 Complex Wound Assessment / Reassessment - multiple wounds []  - 0 Dermatologic / Skin Assessment (not related to wound area) ASSESSMENTS - Focused Assessment []  - 0 Circumferential Edema Measurements - multi extremities []  - 0 Nutritional Assessment / Counseling / Intervention []  - 0 Lower Extremity Assessment (monofilament, tuning fork, pulses) []  - 0 Peripheral Arterial Disease Assessment (using hand held doppler) ASSESSMENTS - Ostomy and/or Continence Assessment and Care []  - 0 Incontinence Assessment and Management []  - 0 Ostomy Care Assessment and Management (repouching, etc.) PROCESS - Coordination of Care X - Simple Patient / Family Education for ongoing care 1 15 []  - 0 Complex (extensive) Patient / Family Education for ongoing care X- 1 10 Staff obtains Chiropractor, Records, [name] Results / Process Orders est []  - 0 Staff telephones HHA, Nursing Homes / Clarify orders / etc []  - 0 Routine Transfer to another Facility (non-emergent condition) []  - 0 Routine Hospital Admission (non-emergent condition) []  - 0 New Admissions / Manufacturing engineer / Ordering NPWT Apligraf, etc. , []  - 0 Emergency Hospital Admission (emergent condition) X- 1 10 Simple Discharge Coordination []  - 0 Complex (extensive) Discharge Coordination PROCESS - Special Needs []  - 0 Pediatric / Minor Patient Management []  - 0 Isolation Patient Management []  - 0 Hearing / Language / Visual special needs []  - 0 Assessment of Community assistance (transportation, D/C planning, etc.) []  - 0 Additional assistance / Altered mentation []  - 0 Support  Surface(s) Assessment (bed, cushion, seat, etc.) INTERVENTIONS - Wound Cleansing / Measurement X - Simple Wound Cleansing - one wound 1 5 []  - 0  Complex Wound Cleansing - multiple wounds X- 1 5 Wound Imaging (photographs - any number of wounds) []  - 0 Wound Tracing (instead of photographs) X- 1 5 Simple Wound Measurement - one wound []  - 0 Complex Wound Measurement - multiple wounds INTERVENTIONS - Wound Dressings X - Small Wound Dressing one or multiple wounds 1 10 []  - 0 Medium Wound Dressing one or multiple wounds []  - 0 Large Wound Dressing one or multiple wounds []  - 0 Application of Medications - topical []  - 0 Application of Medications - injection INTERVENTIONS - Miscellaneous []  - 0 External ear exam []  - 0 Specimen Collection (cultures, biopsies, blood, body fluids, etc.) []  - 0 Specimen(s) / Culture(s) sent or taken to Lab for analysis Natalie [name], Natalie [name] ([id number]) [id number] Page 3 of 9 []  - 0 Patient Transfer (multiple staff / Nurse, adult / Similar devices) []  - 0 Simple Staple / Suture removal (25 or less) []  - 0 Complex Staple / Suture removal (26 or more) []  - 0 Hypo / Hyperglycemic Management (close monitor of Blood Glucose) []  - 0 Ankle / Brachial Index (ABI) - do not check if billed separately X- 1 5 Vital Signs Has the patient been seen at the hospital within the last three years: Yes Total Score: 85 Level Of Care: New/Established - Level 3 Electronic Signature(s) Signed: 06/07/2023 5:12:31 PM By: Midge Aver MSN RN CNS WTA Entered By: Midge Aver on 06/03/2023 11:03:34 -------------------------------------------------------------------------------- Encounter Discharge Information Details Patient Name: Date of Service: Natalie [name], Natalie [name]. 06/03/2023 9:45 A M Medical Record Number: [id number] Patient Account Number: [id number] Date of Birth/Sex: Treating RN: [birth year] (89 y.o. Natalie [name] Primary Care Malisha Mabey:  Willow Ora Other Clinician: Referring Vivan Vanderveer: Treating Chemeka Filice/Extender: Oneita Kras Weeks in Treatment: 4 Encounter Discharge Information Items Discharge Condition: Stable Ambulatory Status: Walker Discharge Destination: Home Transportation: Private Auto Accompanied By: son Schedule Follow-up Appointment: Yes Clinical Summary of Care: Electronic Signature(s) Signed: 06/07/2023 5:12:31 PM By: Midge Aver MSN RN CNS WTA Entered By: Midge Aver on 06/03/2023 11:07:02 -------------------------------------------------------------------------------- Lower Extremity Assessment Details Patient Name: Date of Service: Natalie [name]. 06/03/2023 9:45 A M Medical Record Number: [id number] Patient Account Number: [id number] Date of Birth/Sex: Treating RN: [birth year] (89 y.o. Natalie [name] Primary Care Ryatt Corsino: Willow Ora Other Clinician: [name] ([id number]) [id number] Page 4 of 9 Referring Feliciano Wynter: Treating Jessalynn Mccowan/Extender: Oneita Kras Weeks in Treatment: 4 Electronic Signature(s) Signed: 06/07/2023 5:12:31 PM By: Midge Aver MSN RN CNS WTA Entered By: Midge Aver on 06/03/2023 10:40:17 -------------------------------------------------------------------------------- Multi Wound Chart Details Patient Name: Date of Service: Natalie [name], Natalie [name]. 06/03/2023 9:45 A M Medical Record Number: [id number] Patient Account Number: [id number] Date of Birth/Sex: Treating RN: 03/26/1933 (89 y.o. Natalie [name] Primary Care Shambhavi Salley: Willow Ora Other Clinician: Referring Jara Feider: Treating Jeri Jeanbaptiste/Extender: Oneita Kras Weeks in Treatment: 4 Vital Signs Height(in): 66 Pulse(bpm): 83 Weight(lbs): 176 Blood Pressure(mmHg): 165/85 Body Mass Index(BMI): 28.4 Temperature(F): 98.1 Respiratory Rate(breaths/min): 18 [1:Photos:] [N/A:N/A] Right [name] Great oe N/A N/A Wound Location: Gradually Appeared N/A N/A Wounding  Event: Diabetic Wound/Ulcer of the Lower N/A N/A Primary Etiology: Extremity Chronic sinus problems/congestion, N/A N/A Comorbid History: Asthma, Hypertension, Type II Diabetes 12/15/2022 N/A N/A Date Acquired: 4 N/A N/A Weeks of Treatment: Open N/A N/A Wound Status: No N/A N/A Wound Recurrence: 0.2x0.2x0.1 N/A N/A Measurements L x W x D (cm) 0.031 N/A N/A A (cm) : rea 0.003 N/A N/A Volume (cm) : 73.70% N/A  N/A % Reduction in A rea: 93.60% N/A N/A % Reduction in Volume: Grade 2 N/A N/A Classification: Medium N/A N/A Exudate A mount: Serosanguineous N/A N/A Exudate Type: red, brown N/A N/A Exudate Color: Medium (34-66%) N/A N/A Granulation A mount: Red, Pink N/A N/A Granulation Quality: Medium (34-66%) N/A N/A Necrotic A mount: Fat Layer (Subcutaneous Tissue): Yes N/A N/A Exposed Structures: None N/A N/A Epithelialization: Treatment Notes Natalie [name], Natalie [name] ([id number]) [id number] Page 5 of 9 Electronic Signature(s) Signed: 06/07/2023 5:12:31 PM By: Midge Aver MSN RN CNS WTA Entered By: Midge Aver on 06/03/2023 10:42:02 -------------------------------------------------------------------------------- Multi-Disciplinary Care Plan Details Patient Name: Date of Service: Natalie [name], Natalie [name]. 06/03/2023 9:45 A M Medical Record Number: [id number] Patient Account Number: [id number] Date of Birth/Sex: Treating RN: 21-Sep-1932 (89 y.o. Natalie [name] Primary Care Trenyce Loera: Willow Ora Other Clinician: Referring Crimson Dubberly: Treating Shailee Foots/Extender: Oneita Kras Weeks in Treatment: 4 Active Inactive Necrotic Tissue Nursing Diagnoses: Impaired tissue integrity related to necrotic/devitalized tissue Knowledge deficit related to management of necrotic/devitalized tissue Goals: Necrotic/devitalized tissue will be minimized in the wound bed Date Initiated: 05/02/2023 Target Resolution Date: 06/27/2023 Goal Status:  Active Patient/caregiver will verbalize understanding of reason and process for debridement of necrotic tissue Date Initiated: 05/02/2023 Date Inactivated: 05/02/2023 Target Resolution Date: 05/02/2023 Goal Status: Met Interventions: Assess patient pain level pre-, during and post procedure and prior to discharge Provide education on necrotic tissue and debridement process Treatment Activities: Apply topical anesthetic as ordered : 05/02/2023 Excisional debridement : 05/02/2023 Notes: Wound/Skin Impairment Nursing Diagnoses: Impaired tissue integrity Goals: Ulcer/skin breakdown will have a volume reduction of 30% by week 4 Date Initiated: 05/02/2023 Date Inactivated: 05/27/2023 Target Resolution Date: 05/30/2023 Goal Status: Met Ulcer/skin breakdown will have a volume reduction of 50% by week 8 Date Initiated: 05/02/2023 Target Resolution Date: 06/27/2023 Goal Status: Active Ulcer/skin breakdown will have a volume reduction of 80% by week 12 Date Initiated: 05/02/2023 Target Resolution Date: 07/25/2023 Goal Status: Active Ulcer/skin breakdown will heal within 14 weeks Date Initiated: 05/02/2023 Target Resolution Date: 08/08/2023 Goal Status: Active Interventions: Assess patient/caregiver ability to obtain necessary supplies Natalie [name], Natalie [name] ([id number]) [id number] Page 6 of 9 Assess patient/caregiver ability to perform ulcer/skin care regimen upon admission and as needed Assess ulceration(s) every visit Provide education on ulcer and skin care Notes: Electronic Signature(s) Signed: 06/07/2023 5:12:31 PM By: Midge Aver MSN RN CNS WTA Entered By: Midge Aver on 06/03/2023 11:03:46 -------------------------------------------------------------------------------- Pain Assessment Details Patient Name: Date of Service: Natalie [name]. 06/03/2023 9:45 A M Medical Record Number: [id number] Patient Account Number: [id number] Date of Birth/Sex:  Treating RN: 08/28/1932 (89 y.o. Natalie [name] Primary Care Aleighya Mcanelly: Willow Ora Other Clinician: Referring Anthone Prieur: Treating Koury Roddy/Extender: Oneita Kras Weeks in Treatment: 4 Active Problems Location of Pain Severity and Description of Pain Patient Has Paino No Site Locations Pain Management and Medication Current Pain Management: Electronic Signature(s) Signed: 06/07/2023 5:12:31 PM By: Midge Aver MSN RN CNS WTA Entered By: Midge Aver on 06/03/2023 10:34:39 [name] ([id number]) [id number] Page 7 of 9 -------------------------------------------------------------------------------- Patient/Caregiver Education Details Patient Name: Date of Service: Natalie [name]. 1/17/2025andnbsp9:45 A M Medical Record Number: [id number] Patient Account Number: [id number] Date of Birth/Gender: Treating RN: 16-Dec-1932 (89 y.o. Natalie [name] Primary Care Physician: Willow Ora Other Clinician: Referring Physician: Treating Physician/Extender: Porfirio Oar in Treatment: 4 Education Assessment Education Provided To: Patient Education Topics Provided Wound/Skin Impairment: Handouts: Caring for Your Ulcer Methods: Explain/Verbal Responses: State  content correctly Electronic Signature(s) Signed: 06/07/2023 5:12:31 PM By: Midge Aver MSN RN CNS WTA Entered By: Midge Aver on 06/03/2023 11:03:56 -------------------------------------------------------------------------------- Wound Assessment Details Patient Name: Date of Service: Natalie [name]. 06/03/2023 9:45 A M Medical Record Number: [id number] Patient Account Number: [id number] Date of Birth/Sex: Treating RN: June 05, 1932 (89 y.o. Natalie [name] Primary Care Suren Payne: Willow Ora Other Clinician: Referring Ernestine Rohman: Treating Cambell Stanek/Extender: Oneita Kras Weeks in Treatment: 4 Wound Status Wound Number: 1 Primary Diabetic Wound/Ulcer of the Lower  Extremity Etiology: Wound Location: Right [name] Great oe Wound Status: Open Wounding Event: Gradually Appeared Comorbid Chronic sinus problems/congestion, Asthma, Hypertension, Date Acquired: 12/15/2022 History: Type II Diabetes Weeks Of Treatment: 4 Clustered Wound: No Photos Natalie [name], Natalie [name] ([id number]) [id number].pdf Page 8 of 9 Wound Measurements Length: (cm) 0.2 Width: (cm) 0.2 Depth: (cm) 0.1 Area: (cm) 0.031 Volume: (cm) 0.003 % Reduction in Area: 73.7% % Reduction in Volume: 93.6% Epithelialization: None Wound Description Classification: Grade 2 Exudate Amount: Medium Exudate Type: Serosanguineous Exudate Color: red, brown Foul Odor After Cleansing: No Slough/Fibrino Yes Wound Bed Granulation Amount: Medium (34-66%) Exposed Structure Granulation Quality: Red, Pink Fat Layer (Subcutaneous Tissue) Exposed: Yes Necrotic Amount: Medium (34-66%) Treatment Notes Wound #1 (Toe Great) Wound Laterality: Right Cleanser Vashe 5.8 (oz) Discharge Instruction: Use vashe 5.8 (oz) as directed Wound Cleanser Discharge Instruction: Wash your hands with soap and water. Remove old dressing, discard into plastic bag and place into trash. Cleanse the wound with Wound Cleanser prior to applying a clean dressing using gauze sponges, not tissues or cotton balls. Do not scrub or use excessive force. Pat dry using gauze sponges, not tissue or cotton balls. Peri-Wound Care Topical Primary Dressing Prisma 4.34 (in) Discharge Instruction: Moisten w/normal saline or sterile water; Cover wound as directed. Do not remove from wound bed. Secondary Dressing Coverlet Latex-Free Fabric Adhesive Dressings Discharge Instruction: Knuckle Secured With Compression Wrap Compression Stockings Add-Ons Electronic Signature(s) Signed: 06/07/2023 5:12:31 PM By: Midge Aver MSN RN CNS WTA Entered By: Midge Aver on 06/03/2023 10:38:40 [name] ([id number])  [id number].pdf Page 9 of 9 -------------------------------------------------------------------------------- Vitals Details Patient Name: Date of Service: Natalie [name]. 06/03/2023 9:45 A M Medical Record Number: [id number] Patient Account Number: [id number] Date of Birth/Sex: Treating RN: [birth year] (89 y.o. Natalie [name] Primary Care Ninnie Fein: Willow Ora Other Clinician: Referring Ahmya Bernick: Treating Justice Aguirre/Extender: Oneita Kras Weeks in Treatment: 4 Vital Signs Time Taken: 10:35 Temperature (F): 98.1 Height (in): 66 Pulse (bpm): 83 Weight (lbs): 176 Respiratory Rate (breaths/min): 18 Body Mass Index (BMI): 28.4 Blood Pressure (mmHg): 165/85 Reference Range: 80 - 120 mg / dl Electronic Signature(s) Signed: 06/07/2023 5:12:31 PM By: Midge Aver MSN RN CNS WTA Entered By: Midge Aver on 06/03/2023 10:34:29

## 2023-06-06 ENCOUNTER — Ambulatory Visit: Payer: Medicare PPO | Admitting: Podiatry

## 2023-06-07 ENCOUNTER — Ambulatory Visit: Payer: Medicare PPO | Admitting: Podiatry

## 2023-06-10 ENCOUNTER — Encounter: Payer: Medicare PPO | Admitting: Physician Assistant

## 2023-06-10 DIAGNOSIS — E1151 Type 2 diabetes mellitus with diabetic peripheral angiopathy without gangrene: Secondary | ICD-10-CM | POA: Diagnosis not present

## 2023-06-10 DIAGNOSIS — J45909 Unspecified asthma, uncomplicated: Secondary | ICD-10-CM | POA: Diagnosis not present

## 2023-06-10 DIAGNOSIS — L97512 Non-pressure chronic ulcer of other part of right foot with fat layer exposed: Secondary | ICD-10-CM | POA: Diagnosis not present

## 2023-06-10 DIAGNOSIS — E11621 Type 2 diabetes mellitus with foot ulcer: Secondary | ICD-10-CM | POA: Diagnosis not present

## 2023-06-10 DIAGNOSIS — I10 Essential (primary) hypertension: Secondary | ICD-10-CM | POA: Diagnosis not present

## 2023-06-20 ENCOUNTER — Encounter: Payer: Medicare PPO | Attending: Physician Assistant | Admitting: Physician Assistant

## 2023-06-20 DIAGNOSIS — L97512 Non-pressure chronic ulcer of other part of right foot with fat layer exposed: Secondary | ICD-10-CM | POA: Diagnosis not present

## 2023-06-20 DIAGNOSIS — E11621 Type 2 diabetes mellitus with foot ulcer: Secondary | ICD-10-CM | POA: Diagnosis not present

## 2023-06-20 DIAGNOSIS — I10 Essential (primary) hypertension: Secondary | ICD-10-CM | POA: Insufficient documentation

## 2023-06-28 ENCOUNTER — Ambulatory Visit: Payer: Medicare PPO | Admitting: Physician Assistant

## 2023-06-29 ENCOUNTER — Other Ambulatory Visit: Payer: Medicare PPO

## 2023-07-07 ENCOUNTER — Ambulatory Visit: Payer: Medicare PPO | Admitting: Physician Assistant

## 2023-07-12 DIAGNOSIS — R300 Dysuria: Secondary | ICD-10-CM | POA: Diagnosis not present

## 2023-07-12 DIAGNOSIS — R3989 Other symptoms and signs involving the genitourinary system: Secondary | ICD-10-CM | POA: Diagnosis not present

## 2023-07-18 ENCOUNTER — Encounter: Payer: Medicare PPO | Attending: Physician Assistant | Admitting: Physician Assistant

## 2023-07-18 DIAGNOSIS — I10 Essential (primary) hypertension: Secondary | ICD-10-CM | POA: Insufficient documentation

## 2023-07-18 DIAGNOSIS — E119 Type 2 diabetes mellitus without complications: Secondary | ICD-10-CM | POA: Diagnosis not present

## 2023-07-18 DIAGNOSIS — L97512 Non-pressure chronic ulcer of other part of right foot with fat layer exposed: Secondary | ICD-10-CM | POA: Insufficient documentation

## 2023-07-18 DIAGNOSIS — E11621 Type 2 diabetes mellitus with foot ulcer: Secondary | ICD-10-CM | POA: Insufficient documentation

## 2023-07-20 ENCOUNTER — Other Ambulatory Visit: Payer: Medicare PPO

## 2023-07-20 DIAGNOSIS — N952 Postmenopausal atrophic vaginitis: Secondary | ICD-10-CM | POA: Diagnosis not present

## 2023-07-20 DIAGNOSIS — Z87440 Personal history of urinary (tract) infections: Secondary | ICD-10-CM | POA: Diagnosis not present

## 2023-07-26 ENCOUNTER — Ambulatory Visit: Payer: Medicare PPO | Admitting: Internal Medicine

## 2023-07-26 ENCOUNTER — Encounter: Payer: Self-pay | Admitting: Internal Medicine

## 2023-07-26 VITALS — BP 126/86 | HR 99 | Temp 98.1°F | Resp 18 | Ht 66.0 in | Wt 173.4 lb

## 2023-07-26 DIAGNOSIS — E785 Hyperlipidemia, unspecified: Secondary | ICD-10-CM | POA: Diagnosis not present

## 2023-07-26 DIAGNOSIS — Z7984 Long term (current) use of oral hypoglycemic drugs: Secondary | ICD-10-CM

## 2023-07-26 DIAGNOSIS — I10 Essential (primary) hypertension: Secondary | ICD-10-CM

## 2023-07-26 DIAGNOSIS — K5900 Constipation, unspecified: Secondary | ICD-10-CM | POA: Diagnosis not present

## 2023-07-26 DIAGNOSIS — G3184 Mild cognitive impairment, so stated: Secondary | ICD-10-CM | POA: Diagnosis not present

## 2023-07-26 DIAGNOSIS — E1140 Type 2 diabetes mellitus with diabetic neuropathy, unspecified: Secondary | ICD-10-CM | POA: Diagnosis not present

## 2023-07-26 NOTE — Patient Instructions (Addendum)
 Stop vitamins containing calcium Stop milk of magnesia Stop Aleve Okay continue taking one or two over-the-counter Tylenol capsules.  For constipation: Recommend MiraLAX 17 g mixed with water daily as needed     GO TO THE LAB : Get the blood work     Please go to the front desk: Arrange a follow-up in 4 to 5 months     Per our records you are soon due for your diabetic eye exam. Please contact your eye doctor to schedule an appointment. Please have them send copies of your office visit notes to [place]. Our fax number is [phone]. If you need a referral to an eye doctor please let us know.

## 2023-07-26 NOTE — Progress Notes (Unsigned)
 Subjective:    Patient ID: Natalie [name], female    DOB: 10/01/1932, 89 y.o.   MRN: [id number]  DOS:  07/26/2023 Type of visit - description: Routine visit  No new concerns other than she wound at her right foot, seen at the wound care center. Also complaining of some constipation without nausea or vomiting.  No blood in the stools.  No diarrhea except for a single episode 1 day. Was a talk about pain management.   Review of Systems See above   Past Medical History:  Diagnosis Date   Back pain, chronic    BCC (basal cell carcinoma of skin)    several, nose , 2012 L forehead   Degeneration of lumbar intervertebral disc    Dr. Ethelene Hal   Diabetes mellitus    HTN (hypertension)    Hyperlipidemia    Hyperparathyroidism    Lumbago    Lumbar epidural steroid injection at L5-S1 midline by Dr. Ethelene Hal   Pelvic pain in female 2005    back and pelvic pain, s/p w-u, CT (-), u/s showed a thick endometrium, s/p Bx per gyn   Ureteric obstruction    hydronephrosis,sees urology    Past Surgical History:  Procedure Laterality Date   CYSTECTOMY  1989   thyriod   LOBECTOMY  1989   s/p  RML lung lobectomy, biopsy showed precancerous changes    POLYPECTOMY     colon    Current Outpatient Medications  Medication Instructions   ACCU-CHEK GUIDE test strip USE TO CHECK BLOOD GLUCOSE ONCE DAILY.   acetaminophen (TYLENOL) 1,250 mg, Every 6 hours PRN   albuterol (PROVENTIL HFA;VENTOLIN HFA) 108 (90 BASE) MCG/ACT inhaler 2 puffs, Every 6 hours PRN   amLODipine (NORVASC) 10 mg, Oral, Daily   azelastine (ASTELIN) 0.1 % nasal spray 2 sprays, Each Nare, 2 times daily   BIOTIN PO Take by mouth.   blood glucose meter kit and supplies Dispense based on patient and insurance preference. Check blood sugars once daily   calcium carbonate (TUMS - DOSED IN MG ELEMENTAL CALCIUM) 500 MG chewable tablet 1 tablet, Daily   CRANBERRY PO Take by mouth.   D-Mannose 500 MG CAPS Take by mouth.   EPIPEN 2-PAK  0.3 MG/0.3ML SOAJ injection Reported on 11/27/2015   fluticasone (FLONASE) 50 MCG/ACT nasal spray 2 sprays, Each Nare, Daily   fluticasone (FLOVENT HFA) 110 MCG/ACT inhaler 1-2 puffs, 2 times daily   furosemide (LASIX) 20 MG tablet TAKE 1 TABLET(20 MG) BY MOUTH DAILY   HYDROcodone-acetaminophen (NORCO) 10-325 MG per tablet 1 tablet, Every 8 hours PRN   Lido-Menthol-Methyl Sal-Camph (CBD KINGS EX) Apply topically. (Roll on salve)   lisinopril (ZESTRIL) 20 mg, Oral, Daily   loratadine (CLARITIN) 10 mg, Daily   metFORMIN (GLUCOPHAGE) 500 mg, Oral, 2 times daily with meals   Multiple Vitamins-Minerals (MULTIVITAMIN,TX-MINERALS) tablet 1 tablet, Daily   mupirocin ointment (BACTROBAN) 2 % 1 Application, Topical, Daily   naproxen sodium (ALEVE) 110 mg, Daily   OneTouch Delica Lancets 33G MISC Check blood sugar once daily   Simethicone (MAALOX ANTI-GAS PO) As needed       Objective:   Physical Exam BP 126/86   Pulse 99   Temp 98.1 F (36.7 C) (Oral)   Resp 18   Ht 5\' 6"  (1.676 m)   Wt 173 lb 6 oz (78.6 kg)   SpO2 95%   BMI 27.98 kg/m  General:   Well developed, NAD, BMI noted. HEENT:  Normocephalic . Face symmetric,  atraumatic Lungs:  CTA B Normal respiratory effort, no intercostal retractions, no accessory muscle use. Heart: RRR, soft systolic murmur.  Lower extremities: +/+++ pretibial edema bilaterally.  Right foot is in a postop shoe. Skin: Not pale. Not jaundice Neurologic:  alert & oriented X3.  Speech normal, gait assisted by walker.  Psych--  Cognition and judgment appear intact.  Cooperative with normal attention span and concentration.  Behavior appropriate. No anxious or depressed appearing.      Assessment     Assessment DM, complicated by neuropathy and callus HTN Hyperlipidemia, intolerant to medications , not checking labs Allergies, B-spasm, Dr Fullerton Callas  Fatigue, chronic GU:  Hydronephrosis  dx ~ 2007, follow-up with serial USs. DX was right UPJ  obstruction. Had a Lasix renogram 2013 ( R 46%, L 54%) Dr. Margarita Grizzle, rx to RTC PRN.  [place] 11-2016 : no obstruction MSK: --DJD --Chronic back pain, dr Ethelene Hal , he rx hydrocodone BCC H/o pelvic pain, 2005, w/u per gyn H/o hyperparathyroidism H/o RML Lobectomy, 1989, bx precancerous cells Heart murmur: Echocardiogram 04/2017 and 02-2023: Essentially normal  PLAN BMP A1c RPR homocystine level B12 DM: Continue metformin, check A1c. HTN: BP seems well-controlled on Lasix, lisinopril, amlodipine.  Check BMP. MCI: See LOV, check labs. Constipation: In the context of hydrocodone, I do not believe she will be able to stop it.  Taking Milk of Magnesia.  Recommend MiraLAX daily as needed instead.  Also stop any calcium supplements that can aggravate the problem. Chronic back pain:  On chronic hydrocodone.  Also on low-dose of Aleve daily. Recommend to stop Aleve due to risk of GI and kidney side effects. Okay to take a extra 1 or 2 Tylenol's a day.  (Only take 2 hydrocodone's daily)  RTC 4 to 5 months      === MCI: The patient is forgetful, her MMSE score 26, mostly because she had a hard time spelling backwards [place].  Overall, she is doing well, we agreed to do some specific blood work at the next opportunity (RPR,Homocystine level and B12.  Last TSH normal). We talk about possible medication, Aricept.  Advised that she will have to take for years, has potential side effects. At this point I am not sure that these are her best interest and she agreed.  Will revisit the issue periodically.  Encouraged to stay active mentally. UTI: Urine culture +, received ciprofloxacin, was recommended to see urology if symptoms continue. Next visit: 4 months

## 2023-07-27 LAB — BASIC METABOLIC PANEL
BUN: 19 mg/dL (ref 6–23)
CO2: 27 meq/L (ref 19–32)
Calcium: 10.4 mg/dL (ref 8.4–10.5)
Chloride: 101 meq/L (ref 96–112)
Creatinine, Ser: 0.76 mg/dL (ref 0.40–1.20)
GFR: 69.99 mL/min (ref >60.00)
Glucose, Bld: 87 mg/dL (ref 70–99)
Potassium: 4.7 meq/L (ref 3.5–5.1)
Sodium: 136 meq/L (ref 135–145)

## 2023-07-27 LAB — HOMOCYSTEINE: Homocysteine: 14.1 umol/L — ABNORMAL HIGH (ref <10.4)

## 2023-07-27 LAB — RPR: RPR Ser Ql: NONREACTIVE

## 2023-07-27 LAB — B12 AND FOLATE PANEL
Folate: >25.2 ng/mL (ref >5.9)
Vitamin B-12: 456 pg/mL (ref 211–911)

## 2023-07-27 LAB — HEMOGLOBIN A1C: Hgb A1c MFr Bld: 6.1 % (ref 4.6–6.5)

## 2023-07-27 NOTE — Assessment & Plan Note (Addendum)
 DM: Continue metformin, check A1c. HTN: BP seems well-controlled on Lasix, lisinopril, amlodipine.  Check BMP. MCI: See LOV, check labs. Constipation: In the context of hydrocodone, I do not believe she will be able to stop it.  Taking Milk of Magnesia.  Recommend MiraLAX daily as needed instead.  Also stop any calcium supplements that can aggravate the problem. Chronic back pain:  On chronic hydrocodone.  Also on low-dose of Aleve daily. Recommend to stop Aleve due to risk of GI and kidney side effects. Okay to take a extra 1 or 2 Tylenols a day.  (Only take 2 hydrocodones daily) Injury/wound right foot: Follow-up at the wound care center, doing better per patient  RTC 4 to 5 months

## 2023-07-28 ENCOUNTER — Encounter: Payer: Self-pay | Admitting: Internal Medicine

## 2023-08-10 ENCOUNTER — Telehealth: Payer: Self-pay

## 2023-08-10 NOTE — Telephone Encounter (Signed)
 Results mailed

## 2023-08-10 NOTE — Telephone Encounter (Signed)
 Copied from CRM [id number]. Topic: Clinical - Lab/Test Results >> Aug 10, 2023  4:01 PM Fredrich Romans wrote: Reason for CRM: Patient would like to know if she could have her lab results from 07/26/2023 mailed to her home.

## 2023-08-24 ENCOUNTER — Ambulatory Visit (INDEPENDENT_AMBULATORY_CARE_PROVIDER_SITE_OTHER)

## 2023-08-24 DIAGNOSIS — M2142 Flat foot [pes planus] (acquired), left foot: Secondary | ICD-10-CM | POA: Diagnosis not present

## 2023-08-24 DIAGNOSIS — M216X2 Other acquired deformities of left foot: Secondary | ICD-10-CM

## 2023-08-24 DIAGNOSIS — E11621 Type 2 diabetes mellitus with foot ulcer: Secondary | ICD-10-CM | POA: Diagnosis not present

## 2023-08-24 DIAGNOSIS — M2141 Flat foot [pes planus] (acquired), right foot: Secondary | ICD-10-CM

## 2023-08-24 DIAGNOSIS — M2041 Other hammer toe(s) (acquired), right foot: Secondary | ICD-10-CM | POA: Diagnosis not present

## 2023-08-25 NOTE — Progress Notes (Signed)
 Patient was here and "fit " with shoes and inserts however ppw has expired since patient had great toe wound and has been waiting to get Wound care approval to now get shoes  Ppw re-faxed to Dr. Drue Novel once received patient can receive shoes    Patient was fit with 1 pair of diabetic shoes and 3 pairs of foam casted diabetic insoles. Fit was satisfactory. Instructions for break-in and wear was reviewed and a copy was given to the patient.   Re-appointment for regularly scheduled diabetic foot care visits or if they should experience any trouble with the shoes or insoles.   I will call patient when ppwk is received  Natalie [name] Cped, CFo, CFm

## 2023-09-09 NOTE — Progress Notes (Signed)
 09/09/2023 delivery of shoes patient here to pick up  New paperwork recevd and dated 4.11.2025

## 2023-09-26 ENCOUNTER — Telehealth: Payer: Self-pay

## 2023-09-26 NOTE — Telephone Encounter (Signed)
 Pt LM asking about the status of her DM shoes

## 2023-11-08 ENCOUNTER — Other Ambulatory Visit: Payer: Self-pay | Admitting: Internal Medicine

## 2023-11-10 ENCOUNTER — Other Ambulatory Visit: Payer: Self-pay | Admitting: Internal Medicine

## 2023-11-10 DIAGNOSIS — M48061 Spinal stenosis, lumbar region without neurogenic claudication: Secondary | ICD-10-CM | POA: Diagnosis not present

## 2023-11-10 DIAGNOSIS — M51362 Other intervertebral disc degeneration, lumbar region with discogenic back pain and lower extremity pain: Secondary | ICD-10-CM | POA: Diagnosis not present

## 2023-11-10 DIAGNOSIS — T402X5A Adverse effect of other opioids, initial encounter: Secondary | ICD-10-CM | POA: Diagnosis not present

## 2023-11-10 DIAGNOSIS — K5903 Drug induced constipation: Secondary | ICD-10-CM | POA: Diagnosis not present

## 2023-11-17 ENCOUNTER — Ambulatory Visit: Admitting: Physician Assistant

## 2023-11-19 ENCOUNTER — Other Ambulatory Visit: Payer: Self-pay | Admitting: Internal Medicine

## 2023-11-20 ENCOUNTER — Other Ambulatory Visit: Payer: Self-pay | Admitting: Internal Medicine

## 2023-11-20 DIAGNOSIS — M549 Dorsalgia, unspecified: Secondary | ICD-10-CM | POA: Diagnosis not present

## 2023-11-20 DIAGNOSIS — N3000 Acute cystitis without hematuria: Secondary | ICD-10-CM | POA: Diagnosis not present

## 2023-12-15 ENCOUNTER — Ambulatory Visit: Payer: Self-pay

## 2023-12-15 NOTE — Telephone Encounter (Signed)
 FYI Only or Action Required?: Action required by provider: referral request.  Patient was last seen in primary care on 07/26/2023 by Amon Aloysius BRAVO, MD.  Called Nurse Triage reporting Skin Problem.  Symptoms began several weeks ago.  Interventions attempted: Nothing.  Symptoms are: gradually worsening.  Triage Disposition: See PCP Within 2 Weeks  Patient/caregiver understands and will follow disposition?: No, wishes to speak with PCP       Copied from CRM [id number]. Topic: Clinical - Medical Advice >> Dec 15, 2023  4:43 PM Rea BROCKS wrote: Reason for CRM: Patient asks if Dr. Amon has any recommendations for skin issues or should she get a referral for a Dermatologists. She knows she see's Dr. Amon mid next month but was just wondering before seeing him.  [phone] (H) Reason for Disposition  Caller is uncertain what lesion is  [1] Dry skin AND [2] no improvement using Care Advice  Answer Assessment - Initial Assessment Questions 1. APPEARANCE of LESION: What does it look like?      Scaly spot on face 2. SIZE: How big is it? (e.g., inches, cm; or compare to size of pinhead, tip of pen, eraser, coin, pea, grape, ping pong ball)      Smaller than eraser 3. COLOR: What color is it? Is there more than one color?     It's getting darker 4. SHAPE: What shape is it? (e.g., round, irregular)     Unknown, pt unable to see clearly 5. RAISED: Does it stick up above the skin or is it flat? (e.g., raised or elevated)     raise 6. TENDER: Does it hurt when you touch it?  (Scale 1-10; or mild, moderate, severe)     N/a 7. LOCATION: Where is it located?      face 8. ONSET: When did it first appear?      A few weeks 9. NUMBER: Is there just one? or Are there others?     1 10. CAUSE: What do you think it is?       unknown 11. OTHER SYMPTOMS: Do you have any other symptoms? (e.g., fever)       N/a 12. PREGNANCY: Is there any chance you are pregnant? When was  your last menstrual period?       N/a    Pt reports wanting a dermatology referral for worsening scaly spot on face. [facility] will forward encounter for Dr. Amon 's office to review and advise. Patient verbalized understanding and is expecting call back from office for next steps/referral confirmation.  Protocols used: Skin Lesion - Moles or Growths-A-AH, Cracked or Dry Skin-A-AH

## 2023-12-16 ENCOUNTER — Telehealth: Payer: Self-pay

## 2023-12-16 DIAGNOSIS — L989 Disorder of the skin and subcutaneous tissue, unspecified: Secondary | ICD-10-CM

## 2023-12-16 NOTE — Telephone Encounter (Signed)
 Copied from CRM [id number]. Topic: Clinical - Medical Advice >> Dec 15, 2023  4:43 PM Rea BROCKS wrote: Reason for CRM: Patient asks if Dr. Amon has any recommendations for skin issues or should she get a referral for a Dermatologists. She knows she see's Dr. Amon mid next month but was just wondering before seeing him.  [phone] (H) >> Dec 16, 2023  9:13 AM Emylou G wrote: Patient called back and wanted to add - she doesn't have to wait.. she is okay to be seen somewhere else

## 2023-12-16 NOTE — Telephone Encounter (Signed)
 Pt called advised her to keep her appointment for the 28th with [facility] , still wanted the message sent to PCP.[name] was advised he is out of the office and his assistant will reach out next week in regards to the derm referral also advised this issue could be discussed at her upcoming appointment on the 12th >> Dec 16, 2023 10:45 AM Natalie [name] wrote: Patient is calling in to let Dr. Amon know she found a dermatology appointment at [facility] dermatology, August 28, Patient is asking if Dr. Amon is okay with her waiting that long. Please advise the patient.

## 2023-12-19 NOTE — Addendum Note (Signed)
 Addended by: Krystie Leiter D on: 12/19/2023 07:51 AM   Modules accepted: Orders

## 2023-12-19 NOTE — Telephone Encounter (Signed)
 Derm referral placed

## 2023-12-27 ENCOUNTER — Encounter: Payer: Self-pay | Admitting: Internal Medicine

## 2023-12-27 ENCOUNTER — Ambulatory Visit: Admitting: Internal Medicine

## 2023-12-27 VITALS — BP 138/88 | HR 99 | Temp 98.3°F | Resp 18 | Ht 66.0 in | Wt 172.2 lb

## 2023-12-27 DIAGNOSIS — E1140 Type 2 diabetes mellitus with diabetic neuropathy, unspecified: Secondary | ICD-10-CM

## 2023-12-27 DIAGNOSIS — R600 Localized edema: Secondary | ICD-10-CM | POA: Diagnosis not present

## 2023-12-27 DIAGNOSIS — Z0100 Encounter for examination of eyes and vision without abnormal findings: Secondary | ICD-10-CM | POA: Diagnosis not present

## 2023-12-27 DIAGNOSIS — I10 Essential (primary) hypertension: Secondary | ICD-10-CM | POA: Diagnosis not present

## 2023-12-27 DIAGNOSIS — E119 Type 2 diabetes mellitus without complications: Secondary | ICD-10-CM

## 2023-12-27 DIAGNOSIS — Z7984 Long term (current) use of oral hypoglycemic drugs: Secondary | ICD-10-CM

## 2023-12-27 DIAGNOSIS — R2689 Other abnormalities of gait and mobility: Secondary | ICD-10-CM

## 2023-12-27 NOTE — Patient Instructions (Signed)
 Vaccines I recommend: Flu shot every fall Consider COVID booster  Continue checking your blood pressure regularly Blood pressure goal:  between 110/65 and  135/85. If it is consistently higher or lower, let me know     GO TO THE LAB :  Get the blood work   Your results will be posted on MyChart with my comments  Next office visit for a checkup in 4 months Please make an appointment before you leave today

## 2023-12-27 NOTE — Progress Notes (Signed)
 Subjective:    Patient ID: Natalie [name], female    DOB: 05-18-1932, 89 y.o.   MRN: [id number]  DOS:  12/27/2023 Type of visit - description: Follow-up, here with her son [name].  3 days ago had a single episode of dizziness described as spinning, vertigo. It happened at when she got up from bed, and improve quickly when she laid down. No associated nausea vomiting.  No diplopia, slurred speech or motor deficits.  Chronic medical problems addressed.  Review of Systems See above   Past Medical History:  Diagnosis Date   Back pain, chronic    BCC (basal cell carcinoma of skin)    several, nose , 2012 L forehead   Degeneration of lumbar intervertebral disc    Dr. Bonner   Diabetes mellitus    HTN (hypertension)    Hyperlipidemia    Hyperparathyroidism    Lumbago    Lumbar epidural steroid injection at L5-S1 midline by Dr. Bonner   Pelvic pain in female 2005    back and pelvic pain, s/p w-u, CT (-), u/s showed a thick endometrium, s/p Bx per gyn   Ureteric obstruction    hydronephrosis,sees urology    Past Surgical History:  Procedure Laterality Date   CYSTECTOMY  1989   thyriod   LOBECTOMY  1989   s/p  RML lung lobectomy, biopsy showed precancerous changes    POLYPECTOMY     colon    Current Outpatient Medications  Medication Instructions   ACCU-CHEK GUIDE TEST test strip Daily   acetaminophen  (TYLENOL ) 1,250 mg, Every 6 hours PRN   albuterol  (PROVENTIL  HFA;VENTOLIN  HFA) 108 (90 BASE) MCG/ACT inhaler 2 puffs, Every 6 hours PRN   amLODipine  (NORVASC ) 10 mg, Oral, Daily   azelastine  (ASTELIN ) 0.1 % nasal spray 2 sprays, Each Nare, 2 times daily   BIOTIN PO Take by mouth.   blood glucose meter kit and supplies Dispense based on patient and insurance preference. Check blood sugars once daily   CRANBERRY PO Take by mouth.   D-Mannose 500 MG CAPS Take by mouth.   EPIPEN 2-PAK 0.3 MG/0.3ML SOAJ injection Reported on 11/27/2015   fluticasone  (FLONASE ) 50 MCG/ACT nasal  spray 2 sprays, Each Nare, Daily   fluticasone  (FLOVENT  HFA) 110 MCG/ACT inhaler 1-2 puffs, 2 times daily   furosemide  (LASIX ) 10 mg, Oral, Daily   HYDROcodone -acetaminophen  (NORCO) 10-325 MG per tablet 1 tablet, Every 8 hours PRN   Lido-Menthol-Methyl Sal-Camph (CBD KINGS EX) Apply topically. (Roll on salve)   lisinopril  (ZESTRIL ) 20 mg, Oral, Daily   loratadine (CLARITIN) 10 mg, Daily   metFORMIN  (GLUCOPHAGE ) 500 mg, Oral, 2 times daily with meals   Multiple Vitamins-Minerals (MULTIVITAMIN,TX-MINERALS) tablet 1 tablet, Daily   OneTouch Delica Lancets 33G MISC Check blood sugar once daily   polyethylene glycol (MIRALAX / GLYCOLAX) 17 g, Daily PRN   Simethicone (MAALOX ANTI-GAS PO) As needed       Objective:   Physical Exam BP 138/88   Pulse 99   Temp 98.3 F (36.8 C) (Oral)   Resp 18   Ht 5' 6 (1.676 m)   Wt 172 lb 4 oz (78.1 kg)   SpO2 97%   BMI 27.80 kg/m  General:   Well developed, NAD, BMI noted. HEENT:  Normocephalic . Face symmetric, atraumatic. TMs and ear canals normal EOMI. Lungs:  CTA B Normal respiratory effort, no intercostal retractions, no accessory muscle use. Heart: RRR,  no murmur.  Lower extremities: no pretibial edema bilaterally  Skin: Not  pale. Not jaundice Neurologic:  alert & oriented X3.  Speech normal, gait assisted by walker. Motor exam: Symmetric.   Psych--  Cognition and judgment appear intact.  Cooperative with normal attention span and concentration.  Behavior appropriate. No anxious or depressed appearing.      Assessment    Assessment DM, complicated by neuropathy and callus HTN Hyperlipidemia, intolerant to medications , not checking labs MCI: MMSE 26 (November 2024) B12, folic acid , TSH, RPR: WNL. Allergies, B-spasm, used to see Dr Frutoso  Fatigue, chronic GU:  Hydronephrosis  dx ~ 2007, follow-up with serial USs. DX was right UPJ obstruction. Had a Lasix  renogram 2013 ( R 46%, L 54%) Dr. Eliza, rx to RTC PRN.  US   11-2016 : no obstruction MSK: --DJD --Chronic back pain, dr Bonner , he rx hydrocodone  BCC H/o pelvic pain, 2005, w/u per gyn H/o hyperparathyroidism H/o RML Lobectomy, 1989, bx precancerous cells Heart murmur: Echocardiogram 04/2017 and 02-2023: Essentially normal  PLAN Dizziness: Single episode as described above, on clinical grounds likely peripheral vertigo, low suspicion for stroke.  We talked about possible further evaluation (MRI) but again we agreed on observation.  She will seek attention if she has more dizziness or different symptoms. DM: Last A1c very good.  On metformin , ambulatory CBGs typically around 113, 112.  Will check A1c. HTN: On amlodipine , lisinopril , Lasix  (1/2 tab every day, intolerant to full dose due to excessive urination). Ambulatory BPs in the 130s.  Plan: Check BMP, CBC.  Continue monitoring BPs MCI: See LOV, B12, folic acid  normal.  Slight increase homocystine levels.  Mentally, she seems to be doing very well. Edema: Again reports lower extremity edema, at baseline, is more noticeable on the right leg, ultrasound a year ago was negative for DVT bilaterally.  No change for now. HOH: As reported by the patient's son, the patient states her hearing is fine.  Refer to audiologist if patient request. Decreased mobility: Patient's son would like her to be more mobile, we had a long discussion about the issue, offered PT, declined. Vaccine advice provided RTC 4 months

## 2023-12-28 LAB — CBC WITH DIFFERENTIAL/PLATELET
Basophils Absolute: 0.1 K/uL (ref 0.0–0.1)
Basophils Relative: 0.7 % (ref 0.0–3.0)
Eosinophils Absolute: 0.2 K/uL (ref 0.0–0.7)
Eosinophils Relative: 2.7 % (ref 0.0–5.0)
HCT: 38.2 % (ref 36.0–46.0)
Hemoglobin: 12.7 g/dL (ref 12.0–15.0)
Lymphocytes Relative: 34.4 % (ref 12.0–46.0)
Lymphs Abs: 3.1 K/uL (ref 0.7–4.0)
MCHC: 33.3 g/dL (ref 30.0–36.0)
MCV: 87.7 fl (ref 78.0–100.0)
Monocytes Absolute: 0.9 K/uL (ref 0.1–1.0)
Monocytes Relative: 10.5 % (ref 3.0–12.0)
Neutro Abs: 4.7 K/uL (ref 1.4–7.7)
Neutrophils Relative %: 51.7 % (ref 43.0–77.0)
Platelets: 314 K/uL (ref 150.0–400.0)
RBC: 4.36 Mil/uL (ref 3.87–5.11)
RDW: 14.3 % (ref 11.5–15.5)
WBC: 9 K/uL (ref 4.0–10.5)

## 2023-12-28 LAB — BASIC METABOLIC PANEL WITH GFR
BUN: 21 mg/dL (ref 6–23)
CO2: 27 meq/L (ref 19–32)
Calcium: 10.4 mg/dL (ref 8.4–10.5)
Chloride: 98 meq/L (ref 96–112)
Creatinine, Ser: 0.81 mg/dL (ref 0.40–1.20)
GFR: 64.64 mL/min (ref >60.00)
Glucose, Bld: 94 mg/dL (ref 70–99)
Potassium: 4.9 meq/L (ref 3.5–5.1)
Sodium: 135 meq/L (ref 135–145)

## 2023-12-28 LAB — HEMOGLOBIN A1C: Hgb A1c MFr Bld: 6.3 % (ref 4.6–6.5)

## 2023-12-28 NOTE — Assessment & Plan Note (Signed)
 Dizziness: Single episode as described above, on clinical grounds likely peripheral vertigo, low suspicion for stroke.  We talked about possible further evaluation (MRI) but again we agreed on observation.  She will seek attention if she has more dizziness or different symptoms. DM: Last A1c very good.  On metformin , ambulatory CBGs typically around 113, 112.  Will check A1c. HTN: On amlodipine , lisinopril , Lasix  (1/2 tab every day, intolerant to full dose due to excessive urination). Ambulatory BPs in the 130s.  Plan: Check BMP, CBC.  Continue monitoring BPs MCI: See LOV, B12, folic acid  normal.  Slight increase homocystine levels.  Mentally, she seems to be doing very well. Edema: Again reports lower extremity edema, at baseline, is more noticeable on the right leg, ultrasound a year ago was negative for DVT bilaterally.  No change for now. HOH: As reported by the patient's son, the patient states her hearing is fine.  Refer to audiologist if patient request. Decreased mobility: Patient's son would like her to be more mobile, we had a long discussion about the issue, offered PT, declined. Vaccine advice provided RTC 4 months

## 2023-12-29 ENCOUNTER — Ambulatory Visit: Payer: Self-pay | Admitting: Internal Medicine

## 2024-01-12 DIAGNOSIS — L821 Other seborrheic keratosis: Secondary | ICD-10-CM | POA: Diagnosis not present

## 2024-01-12 DIAGNOSIS — L820 Inflamed seborrheic keratosis: Secondary | ICD-10-CM | POA: Diagnosis not present

## 2024-01-15 ENCOUNTER — Other Ambulatory Visit: Payer: Self-pay | Admitting: Internal Medicine

## 2024-01-31 ENCOUNTER — Encounter: Payer: Self-pay | Admitting: Podiatry

## 2024-01-31 ENCOUNTER — Ambulatory Visit: Admitting: Podiatry

## 2024-01-31 ENCOUNTER — Ambulatory Visit

## 2024-01-31 DIAGNOSIS — E11621 Type 2 diabetes mellitus with foot ulcer: Secondary | ICD-10-CM | POA: Diagnosis not present

## 2024-01-31 DIAGNOSIS — R601 Generalized edema: Secondary | ICD-10-CM | POA: Diagnosis not present

## 2024-01-31 DIAGNOSIS — L97509 Non-pressure chronic ulcer of other part of unspecified foot with unspecified severity: Secondary | ICD-10-CM

## 2024-01-31 MED ORDER — GENTAMICIN SULFATE 0.1 % EX OINT: 1.0000 | TOPICAL_OINTMENT | Freq: Every day | CUTANEOUS | 1 refills | Status: AC

## 2024-01-31 NOTE — Progress Notes (Unsigned)
 Chief Complaint  Patient presents with   Toe Pain    Right foot distal great toe plantar scab. 1 pain x 3 weeks. Antibiotic cream to treat. NIDDM A1C 6.3.   HPI: 89 y.o. female presents today, with her son-in-law, for concern of a recurring scab/wound on the tip of the right great toe.  Her son-in-law stated that the patient insists on wearing her surgical shoe at all of the time.  He pointed out that the surgical shoe is too short for her foot and her toe is hitting the front edge of the device.  There are a lot of Band-Aids and pads inside of her surgical shoe.  Denies any current drainage.  She has some pain to the area.  She got her diabetic shoes earlier this year but states that she has not been able to wear them because she has dealt with increased swelling to her legs and feet.  She has an appointment after our encounter, with our pedorthist, to see if she can have an exchange for a wider width to accommodate her swelling.  Patient keeps requesting another surgical shoe but in the same size.  That is until she was informed that these are not covered by insurance and there is a $30-$45 charge for the surgical shoe.  Past Medical History:  Diagnosis Date   Back pain, chronic    BCC (basal cell carcinoma of skin)    several, nose , 2012 L forehead   Degeneration of lumbar intervertebral disc    Dr. Bonner   Diabetes mellitus    HTN (hypertension)    Hyperlipidemia    Hyperparathyroidism    Lumbago    Lumbar epidural steroid injection at L5-S1 midline by Dr. Bonner   Pelvic pain in female 2005    back and pelvic pain, s/p w-u, CT (-), u/s showed a thick endometrium, s/p Bx per gyn   Ureteric obstruction    hydronephrosis,sees urology   Past Surgical History:  Procedure Laterality Date   CYSTECTOMY  1989   thyriod   LOBECTOMY  1989   s/p  RML lung lobectomy, biopsy showed precancerous changes    POLYPECTOMY     colon   Allergies  Allergen Reactions   Cefixime     unknown    Cefprozil Nausea And Vomiting   Cefuroxime Axetil Nausea And Vomiting   Nitrofurantoin     unknown   Penicillins     unknown   Sulfonamide Derivatives     unknown    Physical Exam: Trace palpable pedal pulses.  +1 pitting edema bilateral legs and ankles and feet.  There are multiple telangiectasias in the legs.  There is a dry, dark eschar on the distal aspect of the right great toe along the plantar/distal portion of the tip of the toe.  This is approximately 4 to 5 mm in diameter.  Upon debridement of the eschar, skin underlying the eschar is intact.  There are no clinical signs of infection with no erythema or calor present.  No active drainage is noted.  Her surgical shoe was placed onto her foot and the front of the surgical shoe is putting pressure directly on the area of the prior wound.  Assessment/Plan of Care: 1. 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 Generalized edema      Meds ordered this encounter  Medications   gentamicin  ointment (GARAMYCIN ) 0.1 %    Sig: Apply 1 Application topically daily. Apply to  wound daily at tip of toe.    Dispense:  30 g    Refill:  1    Patient instructed to stop wearing the surgical shoe.  She has open toed sandals today and she can continue with these until she can get the exchange sorted out with our pedorthist for her diabetic shoes.  As she would have to go a size larger on the surgical shoe but this would not be covered by her insurance either.  Her son-in-law strongly discouraged the patient from getting it because he states that when she gets the surgical shoe that is the only shoe she will wear.  Will renew her gentamicin  ointment at her request.  She can put a very small amount of few times a week to the area at the distal tip of the toe but no further care is actually needed at this time.  Follow-up as scheduled for diabetic at risk footcare.  Awanda CHARM Imperial, DPM, FACFAS [facility]     [address] [place], [place] [address]                Office [phone]  Fax [phone]

## 2024-02-01 ENCOUNTER — Other Ambulatory Visit: Payer: Self-pay | Admitting: Internal Medicine

## 2024-02-07 ENCOUNTER — Ambulatory Visit: Admitting: Podiatry

## 2024-02-15 ENCOUNTER — Other Ambulatory Visit (HOSPITAL_BASED_OUTPATIENT_CLINIC_OR_DEPARTMENT_OTHER): Payer: Self-pay

## 2024-02-15 MED ORDER — COMIRNATY 30 MCG/0.3ML IM SUSY
0.3000 mL | PREFILLED_SYRINGE | Freq: Once | INTRAMUSCULAR | 0 refills | Status: AC
  Filled 2024-02-15: qty 0.3, 1d supply, fill #0

## 2024-02-15 MED ORDER — FLUZONE HIGH-DOSE 0.5 ML IM SUSY
0.5000 mL | PREFILLED_SYRINGE | Freq: Once | INTRAMUSCULAR | 0 refills | Status: AC
Start: 2024-02-15 — End: 2024-02-16
  Filled 2024-02-15: qty 0.5, 1d supply, fill #0

## 2024-02-21 NOTE — Progress Notes (Signed)
 Adjusted inserts to give more room inside shoes patient was happy son in law was with her  Instructions give for break in  Patient will call if any other problems arise

## 2024-03-01 ENCOUNTER — Ambulatory Visit

## 2024-03-01 VITALS — Ht 66.0 in | Wt 172.0 lb

## 2024-03-01 DIAGNOSIS — Z0000 Encounter for general adult medical examination without abnormal findings: Secondary | ICD-10-CM | POA: Diagnosis not present

## 2024-03-01 NOTE — Progress Notes (Signed)
 Subjective:   Natalie [name] is a 89 y.o. who presents for a Medicare Wellness preventive visit.  As a reminder, Annual Wellness Visits don't include a physical exam, and some assessments may be limited, especially if this visit is performed virtually. We may recommend an in-person follow-up visit with your provider if needed.  Visit Complete: Virtual I connected with  Natalie [name] on 03/01/24 by a audio enabled telemedicine application and verified that I am speaking with the correct person using two identifiers.  Patient Location: Home  Provider Location: Home Office  I discussed the limitations of evaluation and management by telemedicine. The patient expressed understanding and agreed to proceed.  Vital Signs: Because this visit was a virtual/telehealth visit, some criteria may be missing or patient reported. Any vitals not documented were not able to be obtained and vitals that have been documented are patient reported.    Persons Participating in Visit: Patient.  AWV Questionnaire: No: Patient Medicare AWV questionnaire was not completed prior to this visit.  Cardiac Risk Factors include: advanced age (>65men, >63 women);diabetes mellitus;hypertension     Objective:    Today's Vitals   03/01/24 1411  Weight: 172 lb (78 kg)  Height: 5' 6 (1.676 m)   Body mass index is 27.76 kg/m.     03/01/2024    2:23 PM 11/19/2022    1:09 PM 11/10/2021    2:07 PM 07/29/2020    2:53 PM 02/09/2018    2:50 PM  Advanced Directives  Does Patient Have a Medical Advance Directive? Yes Yes Yes Yes No   Type of Estate agent of [place];Living will Living will;Healthcare Power of Attorney Living will    Does patient want to make changes to medical advance directive?    No - Patient declined   Copy of Healthcare Power of Attorney in Chart? No - copy requested No - copy requested        Data saved with a previous flowsheet row definition    Current Medications  (verified) Outpatient Encounter Medications as of 03/01/2024  Medication Sig   ACCU-CHEK GUIDE TEST test strip USE TO CHECK BLOOD SUGAR ONCE DAILY   acetaminophen  (TYLENOL ) 500 MG tablet Take 1,250 mg by mouth every 6 (six) hours as needed for mild pain.   albuterol  (PROVENTIL  HFA;VENTOLIN  HFA) 108 (90 BASE) MCG/ACT inhaler Inhale 2 puffs into the lungs every 6 (six) hours as needed for wheezing.   amLODipine  (NORVASC ) 10 MG tablet Take 1 tablet (10 mg total) by mouth daily.   azelastine  (ASTELIN ) 0.1 % nasal spray USE 2 SPRAYS IN EACH NOSTRIL TWICE DAILY   BIOTIN PO Take by mouth.   blood glucose meter kit and supplies Dispense based on patient and insurance preference. Check blood sugars once daily   CRANBERRY PO Take by mouth.   D-Mannose 500 MG CAPS Take by mouth.   EPIPEN 2-PAK 0.3 MG/0.3ML SOAJ injection Reported on 11/27/2015 (Patient not taking: Reported on 12/27/2023)   fluticasone  (FLONASE ) 50 MCG/ACT nasal spray Place 2 sprays into both nostrils daily.   fluticasone  (FLOVENT  HFA) 110 MCG/ACT inhaler Inhale 1-2 puffs into the lungs 2 (two) times daily.   furosemide  (LASIX ) 20 MG tablet Take 0.5 tablets (10 mg total) by mouth daily.   gentamicin  ointment (GARAMYCIN ) 0.1 % Apply 1 Application topically daily. Apply to wound daily at tip of toe.   HYDROcodone -acetaminophen  (NORCO) 10-325 MG per tablet Take 1 tablet by mouth every 8 (eight) hours as needed.  Lido-Menthol-Methyl Sal-Camph (CBD KINGS EX) Apply topically. (Roll on salve)   lisinopril  (ZESTRIL ) 20 MG tablet TAKE 1 TABLET(20 MG) BY MOUTH DAILY   loratadine (CLARITIN) 10 MG tablet Take 10 mg by mouth daily.   metFORMIN  (GLUCOPHAGE ) 500 MG tablet Take 1 tablet (500 mg total) by mouth 2 (two) times daily with a meal.   Multiple Vitamins-Minerals (MULTIVITAMIN,TX-MINERALS) tablet Take 1 tablet by mouth daily.   OneTouch Delica Lancets 33G MISC Check blood sugar once daily   polyethylene glycol (MIRALAX / GLYCOLAX) 17 g packet  Take 17 g by mouth daily as needed for moderate constipation.   Simethicone (MAALOX ANTI-GAS PO) Take by mouth as needed. Any anti-gas medication[tablet as needed].   No facility-administered encounter medications on file as of 03/01/2024.    Allergies (verified) Cefixime, Cefprozil, Cefuroxime axetil, Nitrofurantoin, Penicillins, and Sulfonamide derivatives   History: Past Medical History:  Diagnosis Date   Back pain, chronic    BCC (basal cell carcinoma of skin)    several, nose , 2012 L forehead   Degeneration of lumbar intervertebral disc    Dr. Bonner   Diabetes mellitus    HTN (hypertension)    Hyperlipidemia    Hyperparathyroidism    Lumbago    Lumbar epidural steroid injection at L5-S1 midline by Dr. Bonner   Pelvic pain in female 2005    back and pelvic pain, s/p w-u, CT (-), u/s showed a thick endometrium, s/p Bx per gyn   Ureteric obstruction    hydronephrosis,sees urology   Past Surgical History:  Procedure Laterality Date   CYSTECTOMY  1989   thyriod   LOBECTOMY  1989   s/p  RML lung lobectomy, biopsy showed precancerous changes    POLYPECTOMY     colon   Family History  Problem Relation Age of Onset   Diabetes Brother        X 2   Hypertension Brother    Hypertension Mother    Brain cancer Mother        tumor   CAD Father        MI at 78   Breast cancer Neg Hx    Colon cancer Neg Hx    Social History   Socioeconomic History   Marital status: Married    Spouse name: Not on file   Number of children: 4   Years of education: Not on file   Highest education level: Not on file  Occupational History   Occupation: retired Leisure centre manager: [employer]  Tobacco Use   Smoking status: Never   Smokeless tobacco: Never  Vaping Use   Vaping status: Never Used  Substance and Sexual Activity   Alcohol use: Not Currently    Comment: Rarely   Drug use: No   Sexual activity: Not Currently  Other Topics Concern   Not on file   Social History Narrative   Lives w/ husband (he is unable to do much)   2 of her 4 children in [place]            Social Drivers of Health   Financial Resource Strain: Low Risk  (03/01/2024)   Overall Financial Resource Strain (CARDIA)    Difficulty of Paying Living Expenses: Not hard at all  Food Insecurity: No Food Insecurity (03/01/2024)   Hunger Vital Sign    Worried About Running Out of Food in the Last Year: Never true    Ran Out of Food in the Last Year: Never  true  Transportation Needs: No Transportation Needs (03/01/2024)   PRAPARE - Administrator, Civil Service (Medical): No    Lack of Transportation (Non-Medical): No  Physical Activity: Insufficiently Active (03/01/2024)   Exercise Vital Sign    Days of Exercise per Week: 7 days    Minutes of Exercise per Session: 10 min  Stress: No Stress Concern Present (03/01/2024)   [employer] of Occupational Health - Occupational Stress Questionnaire    Feeling of Stress: Not at all  Social Connections: Socially Integrated (03/01/2024)   Social Connection and Isolation Panel    Frequency of Communication with Friends and Family: More than three times a week    Frequency of Social Gatherings with Friends and Family: More than three times a week    Attends Religious Services: More than 4 times per year    Active Member of [employer] or Organizations: Yes    Attends Engineer, structural: More than 4 times per year    Marital Status: Married    Tobacco Counseling Counseling given: Not Answered    Clinical Intake:  Pre-visit preparation completed: Yes  Pain : No/denies pain     BMI - recorded: 27.76 Nutritional Status: BMI 25 -29 Overweight Nutritional Risks: None Diabetes: Yes CBG done?: Yes (CBG 110 Per patient) CBG resulted in Enter/ Edit results?: Yes Did pt. bring in CBG monitor from home?: No  Lab Results  Component Value Date   HGBA1C 6.3 12/27/2023   HGBA1C 6.1 07/26/2023   HGBA1C  6.1 02/21/2023     How often do you need to have someone help you when you read instructions, pamphlets, or other written materials from your doctor or pharmacy?: 1 - Never  Interpreter Needed?: No  Information entered by :: Rojelio Blush LPN   Activities of Daily Living     03/01/2024    2:20 PM 12/27/2023    3:55 PM  In your present state of health, do you have any difficulty performing the following activities:  Hearing? 0 0  Vision? 0 0  Difficulty concentrating or making decisions? 0 0  Walking or climbing stairs? 1 1  Comment Uses a Walker   Dressing or bathing? 0 1  Doing errands, shopping? 1 1  Comment Family Advice worker and eating ? N   Using the Toilet? N   In the past six months, have you accidently leaked urine? Y   Comment Wears Pads. Followed by Urologist   Do you have problems with loss of bowel control? N   Managing your Medications? N   Managing your Finances? N   Housekeeping or managing your Housekeeping? Y   Comment Aide assist     Patient Care Team: Amon Aloysius BRAVO, MD as PCP - Diedre Bonner Ade, MD as Consulting Physician (Physical Medicine and Rehabilitation) Lazaro Glatter, MD as Consulting Physician (Otolaryngology) Roz Anes, MD as Consulting Physician (Ophthalmology) Frutoso Luz, MD as Referring Physician (Allergy)  I have updated your Care Teams any recent Medical Services you may have received from other providers in the past year.     Assessment:   This is a routine wellness examination for [facility].  Hearing/Vision screen Hearing Screening - Comments:: Denies hearing difficulties   Vision Screening - Comments:: Wears rx glasses - up to date with routine eye exams with  Dr Abigail   Goals Addressed               This Visit's Progress  Increase physical activity (pt-stated)        Remain active.       Depression Screen     03/01/2024    2:19 PM 12/27/2023    3:55 PM 07/26/2023    3:48 PM 02/21/2023     1:33 PM 11/19/2022    1:09 PM 10/27/2022   11:26 AM 05/31/2022    2:39 PM  PHQ 2/9 Scores  PHQ - 2 Score 0 1 0 0 0 0 0    Fall Risk     03/01/2024    2:22 PM 12/27/2023    3:55 PM 07/26/2023    3:48 PM 02/21/2023    1:33 PM 11/19/2022    1:05 PM  Fall Risk   Falls in the past year? 1 1 0 0 1  Number falls in past yr: 0 0 0 0 0  Injury with Fall? 0 0 0 0 0  Comment Followed by medical attention      Risk for fall due to : No Fall Risks    Impaired balance/gait  Follow up Falls evaluation completed Falls evaluation completed;Education provided Falls evaluation completed;Education provided Falls evaluation completed Falls evaluation completed    MEDICARE RISK AT HOME:  Medicare Risk at Home Any stairs in or around the home?: No If so, are there any without handrails?: No Home free of loose throw rugs in walkways, pet beds, electrical cords, etc?: Yes Adequate lighting in your home to reduce risk of falls?: Yes Life alert?: Yes Use of a cane, walker or w/c?: Yes Grab bars in the bathroom?: Yes Shower chair or bench in shower?: Yes Elevated toilet seat or a handicapped toilet?: Yes  TIMED UP AND GO:  Was the test performed?  No  Cognitive Function: 6CIT completed    03/28/2023    4:16 PM  MMSE - Mini Mental State Exam  Orientation to time 5  Orientation to Place 5  Registration 3  Attention/ Calculation 3  Recall 2  Language- name 2 objects 2  Language- repeat 1  Language- follow 3 step command 2  Language- read & follow direction 1  Write a sentence 1  Copy design 1  Total score 26        03/01/2024    2:24 PM 11/19/2022    1:10 PM 11/10/2021    2:13 PM  6CIT Screen  What Year? 0 points 0 points 0 points  What month? 0 points 0 points 0 points  What time? 0 points 0 points 0 points  Count back from 20 0 points 0 points 0 points  Months in reverse 0 points 2 points 0 points  Repeat phrase 0 points 0 points 4 points  Total Score 0 points 2 points 4 points     Immunizations Immunization History  Administered Date(s) Administered   Fluad Quad(high Dose 65+) 04/28/2022   Fluad Trivalent(High Dose 65+) 02/21/2023   INFLUENZA, HIGH DOSE SEASONAL PF 02/28/2018, 02/27/2019, 02/15/2024   Influenza Split 02/15/2012, 02/05/2014, 02/15/2020   Influenza Whole 02/02/2010   Influenza,inj,Quad PF,6+ Mos 02/27/2013   Influenza-Unspecified 04/12/2015, 03/17/2016, 03/19/2017, 02/10/2021   PFIZER(Purple Top)SARS-COV-2 Vaccination 06/24/2019, 07/19/2019, 02/14/2020, 09/17/2020, 02/10/2021   PNEUMOCOCCAL CONJUGATE-20 12/01/2021   Pfizer Covid-19 Vaccine Bivalent Booster 45yrs & up 02/26/2021   Pfizer(Comirnaty )Fall Seasonal Vaccine 12 years and older 04/09/2022, 03/11/2023, 02/15/2024   Pneumococcal Conjugate-13 03/19/2014   Pneumococcal Polysaccharide-23 04/26/2005, 06/08/2011   Td 11/20/1997, 05/01/2008   Tdap 02/09/2018   Zoster, Live 02/02/2010    Screening Tests Health  Maintenance  Topic Date Due   Zoster Vaccines- Shingrix  (1 of 2) 03/14/1952   OPHTHALMOLOGY EXAM  08/04/2023   FOOT EXAM  04/10/2024   HEMOGLOBIN A1C  06/28/2024   COVID-19 Vaccine (9 - Pfizer risk 2025-26 season) 08/15/2024   Medicare Annual Wellness (AWV)  03/01/2025   DTaP/Tdap/Td (4 - Td or Tdap) 02/10/2028   Pneumococcal Vaccine: 50+ Years  Completed   Influenza Vaccine  Completed   DEXA SCAN  Completed   Meningococcal B Vaccine  Aged Out   Mammogram  Discontinued    Health Maintenance Items Addressed:   Additional Screening:  Vision Screening: Recommended annual ophthalmology exams for early detection of glaucoma and other disorders of the eye. Is the patient up to date with their annual eye exam?  Yes  Who is the provider or what is the name of the office in which the patient attends annual eye exams? Dr Abigail  Dental Screening: Recommended annual dental exams for proper oral hygiene  Community Resource Referral / Chronic Care Management: CRR required this  visit?  No   CCM required this visit?  No   Plan:    I have personally reviewed and noted the following in the patient's chart:   Medical and social history Use of alcohol, tobacco or illicit drugs  Current medications and supplements including opioid prescriptions. Patient is currently taking opioid prescriptions. Information provided to patient regarding non-opioid alternatives. Patient advised to discuss non-opioid treatment plan with their provider. Functional ability and status Nutritional status Physical activity Advanced directives List of other physicians Hospitalizations, surgeries, and ER visits in previous 12 months Vitals Screenings to include cognitive, depression, and falls Referrals and appointments  In addition, I have reviewed and discussed with patient certain preventive protocols, quality metrics, and best practice recommendations. A written personalized care plan for preventive services as well as general preventive health recommendations were provided to patient.   Rojelio LELON Blush, LPN   [id number]   After Visit Summary: (MyChart) Due to this being a telephonic visit, the after visit summary with patients personalized plan was offered to patient via MyChart   Notes: Nothing significant to report at this time.

## 2024-03-01 NOTE — Patient Instructions (Addendum)
 Natalie [name],  Thank you for taking the time for your Medicare Wellness Visit. I appreciate your continued commitment to your health goals. Please review the care plan we discussed, and feel free to reach out if I can assist you further.  Medicare recommends these wellness visits once per year to help you and your care team stay ahead of potential health issues. These visits are designed to focus on prevention, allowing your provider to concentrate on managing your acute and chronic conditions during your regular appointments.  Please note that Annual Wellness Visits do not include a physical exam. Some assessments may be limited, especially if the visit was conducted virtually. If needed, we may recommend a separate in-person follow-up with your provider.  Ongoing Care Seeing your primary care provider every 3 to 6 months helps us  monitor your health and provide consistent, personalized care.   Referrals If a referral was made during today's visit and you haven't received any updates within two weeks, please contact the referred provider directly to check on the status.  Recommended Screenings:  Health Maintenance  Topic Date Due   Zoster (Shingles) Vaccine (1 of 2) 03/14/1952   Eye exam for diabetics  08/04/2023   Complete foot exam   04/10/2024   Hemoglobin A1C  06/28/2024   COVID-19 Vaccine (9 - Pfizer risk 2025-26 season) 08/15/2024   Medicare Annual Wellness Visit  03/01/2025   DTaP/Tdap/Td vaccine (4 - Td or Tdap) 02/10/2028   Pneumococcal Vaccine for age over 22  Completed   Flu Shot  Completed   DEXA scan (bone density measurement)  Completed   Meningitis B Vaccine  Aged Out   Breast Cancer Screening  Discontinued   Opioid Pain Medicine Management Opioids are powerful medicines that are used to treat moderate to severe pain. When used for short periods of time, they can help you to: Sleep better. Do better in physical or occupational therapy. Feel better in the first few  days after an injury. Recover from surgery. Opioids should be taken with the supervision of a trained health care provider. They should be taken for the shortest period of time possible. This is because opioids can be addictive, and the longer you take opioids, the greater your risk of addiction. This addiction can also be called opioid use disorder. What are the risks? Using opioid pain medicines for longer than 3 days increases your risk of side effects. Side effects include: Constipation. Nausea and vomiting. Breathing difficulties (respiratory depression). Drowsiness. Confusion. Opioid use disorder. Itching. Taking opioid pain medicine for a long period of time can affect your ability to do daily tasks. It also puts you at risk for: Motor vehicle crashes. Depression. Suicide. Heart attack. Overdose, which can be life-threatening. What is a pain treatment plan? A pain treatment plan is an agreement between you and your health care provider. Pain is unique to each person, and treatments vary depending on your condition. To manage your pain, you and your health care provider need to work together. To help you do this: Discuss the goals of your treatment, including how much pain you might expect to have and how you will manage the pain. Review the risks and benefits of taking opioid medicines. Remember that a good treatment plan uses more than one approach and minimizes the chance of side effects. Be honest about the amount of medicines you take and about any drug or alcohol use. Get pain medicine prescriptions from only one health care provider. Pain can be managed with  many types of alternative treatments. Ask your health care provider to refer you to one or more specialists who can help you manage pain through: Physical or occupational therapy. Counseling (cognitive behavioral therapy). Good nutrition. Biofeedback. Massage. Meditation. Non-opioid medicine. Following a gentle  exercise program. How to use opioid pain medicine Taking medicine Take your pain medicine exactly as told by your health care provider. Take it only when you need it. If your pain gets less severe, you may take less than your prescribed dose if your health care provider approves. If you are not having pain, do nottake pain medicine unless your health care provider tells you to take it. If your pain is severe, do nottry to treat it yourself by taking more pills than instructed on your prescription. Contact your health care provider for help. Write down the times when you take your pain medicine. It is easy to become confused while on pain medicine. Writing the time can help you avoid overdose. Take other over-the-counter or prescription medicines only as told by your health care provider. Keeping yourself and others safe  While you are taking opioid pain medicine: Do not drive, use machinery, or power tools. Do not sign legal documents. Do not drink alcohol. Do not take sleeping pills. Do not supervise children by yourself. Do not do activities that require climbing or being in high places. Do not go to a lake, river, ocean, spa, or swimming pool. Do not share your pain medicine with anyone. Keep pain medicine in a locked cabinet or in a secure area where pets and children cannot reach it. Stopping your use of opioids If you have been taking opioid medicine for more than a few weeks, you may need to slowly decrease (taper) how much you take until you stop completely. Tapering your use of opioids can decrease your risk of symptoms of withdrawal, such as: Pain and cramping in the abdomen. Nausea. Sweating. Sleepiness. Restlessness. Uncontrollable shaking (tremors). Cravings for the medicine. Do not attempt to taper your use of opioids on your own. Talk with your health care provider about how to do this. Your health care provider may prescribe a step-down schedule based on how much  medicine you are taking and how long you have been taking it. Getting rid of leftover pills Do not save any leftover pills. Get rid of leftover pills safely by: Taking the medicine to a prescription take-back program. This is usually offered by the county or law enforcement. Bringing them to a pharmacy that has a drug disposal container. Flushing them down the toilet. Check the label or package insert of your medicine to see whether this is safe to do. Throwing them out in the trash. Check the label or package insert of your medicine to see whether this is safe to do. If it is safe to throw it out, remove the medicine from the original container, put it into a sealable bag or container, and mix it with used coffee grounds, food scraps, dirt, or cat litter before putting it in the trash. Follow these instructions at home: Activity Do exercises as told by your health care provider. Avoid activities that make your pain worse. Return to your normal activities as told by your health care provider. Ask your health care provider what activities are safe for you. General instructions You may need to take these actions to prevent or treat constipation: Drink enough fluid to keep your urine pale yellow. Take over-the-counter or prescription medicines. Eat foods that are high  in fiber, such as beans, whole grains, and fresh fruits and vegetables. Limit foods that are high in fat and processed sugars, such as fried or sweet foods. Keep all follow-up visits. This is important. Where to find support If you have been taking opioids for a long time, you may benefit from receiving support for quitting from a local support group or counselor. Ask your health care provider for a referral to these resources in your area. Where to find more information Centers for Disease Control and Prevention (CDC): FootballExhibition.com.br [place] Food and Drug Administration (FDA): PumpkinSearch.com.ee Get help right away if: You may have taken too  much of an opioid (overdosed). Common symptoms of an overdose: Your breathing is slower or more shallow than normal. You have a very slow heartbeat (pulse). You have slurred speech. You have nausea and vomiting. Your pupils become very small. You have other potential symptoms: You are very confused. You faint or feel like you will faint. You have cold, clammy skin. You have blue lips or fingernails. You have thoughts of harming yourself or harming others. These symptoms may represent a serious problem that is an emergency. Do not wait to see if the symptoms will go away. Get medical help right away. Call your local emergency services (911 in the [place] Do not drive yourself to the hospital.  If you ever feel like you may hurt yourself or others, or have thoughts about taking your own life, get help right away. Go to your nearest emergency department or: Call your local emergency services (911 in the [place] Call the [facility] (703-761-4933 in the [place] Call a suicide crisis helpline, such as the National Suicide Prevention Lifeline at (352)235-5669 or 988 in the [place] This is open 24 hours a day in the [place] If you're a Veteran: Call 988 and press 1. This is open 24 hours a day. Text the [employer] at 856-096-9933. Summary Opioid medicines can help you manage moderate to severe pain for a short period of time. A pain treatment plan is an agreement between you and your health care provider. Discuss the goals of your treatment, including how much pain you might expect to have and how you will manage the pain. If you think that you or someone else may have taken too much of an opioid, get medical help right away. This information is not intended to replace advice given to you by your health care provider. Make sure you discuss any questions you have with your health care provider. Document Revised: 02/07/2023 Document Reviewed: 08/13/2020 Elsevier Patient Education   2024 [employer]    03/01/2024    2:23 PM  Advanced Directives  Does Patient Have a Medical Advance Directive? Yes  Type of Estate agent of [place];Living will  Copy of Healthcare Power of Attorney in Chart? No - copy requested   Advance Care Planning is important because it: Ensures you receive medical care that aligns with your values, goals, and preferences. Provides guidance to your family and loved ones, reducing the emotional burden of decision-making during critical moments.  Vision: Annual vision screenings are recommended for early detection of glaucoma, cataracts, and diabetic retinopathy. These exams can also reveal signs of chronic conditions such as diabetes and high blood pressure.  Dental: Annual dental screenings help detect early signs of oral cancer, gum disease, and other conditions linked to overall health, including heart disease and diabetes.  Please see the attached documents for additional preventive care recommendations.

## 2024-03-12 ENCOUNTER — Telehealth: Payer: Self-pay

## 2024-03-12 NOTE — Telephone Encounter (Signed)
 Copied from CRM [id number]. Topic: Clinical - Medication Question >> Mar 12, 2024  4:26 PM Anairis L wrote: Reason for CRM: Patient is wondering if she should schedule a physical with provider, [facility] insurance encourage she did. Please advise.

## 2024-03-13 NOTE — Telephone Encounter (Signed)
 Tried calling Pt, no answer, unable to leave message. Changed her appt w/ Padonda in December to a CPX.

## 2024-03-26 DIAGNOSIS — G894 Chronic pain syndrome: Secondary | ICD-10-CM | POA: Diagnosis not present

## 2024-03-26 DIAGNOSIS — M51362 Other intervertebral disc degeneration, lumbar region with discogenic back pain and lower extremity pain: Secondary | ICD-10-CM | POA: Diagnosis not present

## 2024-03-26 DIAGNOSIS — Z5181 Encounter for therapeutic drug level monitoring: Secondary | ICD-10-CM | POA: Diagnosis not present

## 2024-03-26 DIAGNOSIS — Z79891 Long term (current) use of opiate analgesic: Secondary | ICD-10-CM | POA: Diagnosis not present

## 2024-03-26 DIAGNOSIS — M48061 Spinal stenosis, lumbar region without neurogenic claudication: Secondary | ICD-10-CM | POA: Diagnosis not present

## 2024-03-26 DIAGNOSIS — Z79899 Other long term (current) drug therapy: Secondary | ICD-10-CM | POA: Diagnosis not present

## 2024-03-28 ENCOUNTER — Ambulatory Visit: Admitting: Internal Medicine

## 2024-03-28 ENCOUNTER — Other Ambulatory Visit (HOSPITAL_BASED_OUTPATIENT_CLINIC_OR_DEPARTMENT_OTHER): Payer: Self-pay

## 2024-04-10 DIAGNOSIS — M48061 Spinal stenosis, lumbar region without neurogenic claudication: Secondary | ICD-10-CM | POA: Diagnosis not present

## 2024-04-27 ENCOUNTER — Ambulatory Visit: Admitting: Internal Medicine

## 2024-04-30 ENCOUNTER — Ambulatory Visit: Admitting: Internal Medicine

## 2024-05-02 ENCOUNTER — Other Ambulatory Visit: Payer: Self-pay | Admitting: Internal Medicine

## 2024-05-03 ENCOUNTER — Ambulatory Visit (INDEPENDENT_AMBULATORY_CARE_PROVIDER_SITE_OTHER): Admitting: Family

## 2024-05-03 ENCOUNTER — Encounter: Payer: Self-pay | Admitting: Family

## 2024-05-03 ENCOUNTER — Ambulatory Visit: Admitting: Family

## 2024-05-03 VITALS — BP 136/76 | HR 108 | Temp 98.1°F | Resp 16 | Ht 65.5 in | Wt 174.0 lb

## 2024-05-03 DIAGNOSIS — E785 Hyperlipidemia, unspecified: Secondary | ICD-10-CM | POA: Diagnosis not present

## 2024-05-03 DIAGNOSIS — R600 Localized edema: Secondary | ICD-10-CM | POA: Diagnosis not present

## 2024-05-03 DIAGNOSIS — E1149 Type 2 diabetes mellitus with other diabetic neurological complication: Secondary | ICD-10-CM

## 2024-05-03 DIAGNOSIS — Z0000 Encounter for general adult medical examination without abnormal findings: Secondary | ICD-10-CM | POA: Diagnosis not present

## 2024-05-03 DIAGNOSIS — I10 Essential (primary) hypertension: Secondary | ICD-10-CM

## 2024-05-03 LAB — BRAIN NATRIURETIC PEPTIDE: Pro B Natriuretic peptide (BNP): 99 pg/mL (ref 1.0–100.0)

## 2024-05-03 NOTE — Progress Notes (Signed)
 Complete physical exam  Patient: Natalie [name]   DOB: 03-22-1933   89 y.o. Female  MRN: [id number]  Subjective:    Chief Complaint  Patient presents with   Annual Exam    Patient is here for her annual physical.    Natalie [name] is a 89 y.o. female who presents today for a complete physical exam. She reports consuming a general diet. The patient does not participate in regular exercise at present. She generally feels well. She reports sleeping well. She does not have additional problems to discuss today.    Most recent fall risk assessment:    03/01/2024    2:22 PM  Fall Risk   Falls in the past year? 1  Number falls in past yr: 0  Injury with Fall? 0   Comment Followed by medical attention   Risk for fall due to : No Fall Risks  Follow up Falls evaluation completed     Data saved with a previous flowsheet row definition     Most recent depression screenings:    03/01/2024    2:19 PM 12/27/2023    3:55 PM  PHQ 2/9 Scores  PHQ - 2 Score 0 1        Patient Care Team: Amon Aloysius BRAVO, MD as PCP - Diedre Bonner Ade, MD as Consulting Physician (Physical Medicine and Rehabilitation) Lazaro Glatter, MD as Consulting Physician (Otolaryngology) Roz Anes, MD as Consulting Physician (Ophthalmology) Frutoso Luz, MD as Referring Physician (Allergy)   Show/hide medication list[1]  Review of Systems  Constitutional:  Positive for malaise/fatigue.  Cardiovascular:  Positive for leg swelling. Negative for chest pain and palpitations.  All other systems reviewed and are negative.         Objective:     BP 136/76 (BP Location: Right Arm, Patient Position: Sitting, Cuff Size: Large)   Pulse (!) 108   Temp 98.1 F (36.7 C) (Oral)   Resp 16   Ht 5' 5.5 (1.664 m)   Wt 174 lb (78.9 kg)   SpO2 98%   BMI 28.51 kg/m    Physical Exam Vitals and nursing note reviewed.  Constitutional:      Appearance: Normal appearance. She is obese.  HENT:      Head: Normocephalic and atraumatic.     Right Ear: Tympanic membrane, ear canal and external ear normal.     Left Ear: Tympanic membrane, ear canal and external ear normal.     Nose: Nose normal.     Mouth/Throat:     Mouth: Mucous membranes are dry.     Pharynx: Oropharynx is clear.  Eyes:     Extraocular Movements: Extraocular movements intact.     Conjunctiva/sclera: Conjunctivae normal.     Pupils: Pupils are equal, round, and reactive to light.  Cardiovascular:     Rate and Rhythm: Normal rate and regular rhythm.     Pulses: Normal pulses.     Heart sounds: Normal heart sounds.  Pulmonary:     Effort: Pulmonary effort is normal.     Breath sounds: Normal breath sounds.  Abdominal:     General: Abdomen is flat. Bowel sounds are normal.     Palpations: Abdomen is soft.  Musculoskeletal:     Cervical back: Normal range of motion.     Right lower leg: Edema present.     Left lower leg: Edema present.  Skin:    General: Skin is warm and dry.  Neurological:     General:  No focal deficit present.     Mental Status: She is alert and oriented to person, place, and time. Mental status is at baseline.  Psychiatric:        Mood and Affect: Mood normal.        Behavior: Behavior normal.        Thought Content: Thought content normal.        Judgment: Judgment normal.    Past Medical History:  Diagnosis Date   Back pain, chronic    BCC (basal cell carcinoma of skin)    several, nose , 2012 L forehead   Degeneration of lumbar intervertebral disc    Dr. Bonner   Diabetes mellitus    HTN (hypertension)    Hyperlipidemia    Hyperparathyroidism    Lumbago    Lumbar epidural steroid injection at L5-S1 midline by Dr. Bonner   Pelvic pain in female 2005    back and pelvic pain, s/p w-u, CT (-), u/s showed a thick endometrium, s/p Bx per gyn   Ureteric obstruction    hydronephrosis,sees urology    Social History   Socioeconomic History   Marital status: Married     Spouse name: Not on file   Number of children: 4   Years of education: Not on file   Highest education level: Not on file  Occupational History   Occupation: retired Leisure Centre Manager: [employer]  Tobacco Use   Smoking status: Never   Smokeless tobacco: Never  Vaping Use   Vaping status: Never Used  Substance and Sexual Activity   Alcohol use: Not Currently    Comment: Rarely   Drug use: No   Sexual activity: Not Currently  Other Topics Concern   Not on file  Social History Narrative   Lives w/ husband (he is unable to do much)   2 of her 4 children in [place]            Social Drivers of Health   Tobacco Use: Low Risk (05/03/2024)   Patient History    Smoking Tobacco Use: Never    Smokeless Tobacco Use: Never    Passive Exposure: Not on file  Financial Resource Strain: Low Risk (03/01/2024)   Overall Financial Resource Strain (CARDIA)    Difficulty of Paying Living Expenses: Not hard at all  Food Insecurity: No Food Insecurity (03/01/2024)   Epic    Worried About Radiation Protection Practitioner of Food in the Last Year: Never true    Ran Out of Food in the Last Year: Never true  Transportation Needs: No Transportation Needs (03/01/2024)   Epic    Lack of Transportation (Medical): No    Lack of Transportation (Non-Medical): No  Physical Activity: Insufficiently Active (03/01/2024)   Exercise Vital Sign    Days of Exercise per Week: 7 days    Minutes of Exercise per Session: 10 min  Stress: No Stress Concern Present (03/01/2024)   [employer] of Occupational Health - Occupational Stress Questionnaire    Feeling of Stress: Not at all  Social Connections: Socially Integrated (03/01/2024)   Social Connection and Isolation Panel    Frequency of Communication with Friends and Family: More than three times a week    Frequency of Social Gatherings with Friends and Family: More than three times a week    Attends Religious Services: More  than 4 times per year    Active Member of [employer] or Organizations: Yes    Attends Banker  Meetings: More than 4 times per year    Marital Status: Married  Intimate Partner Violence: Not At Risk (03/01/2024)   Epic    Fear of Current or Ex-Partner: No    Emotionally Abused: No    Physically Abused: No    Sexually Abused: No  Depression (PHQ2-9): Low Risk (03/01/2024)   Depression (PHQ2-9)    PHQ-2 Score: 0  Alcohol Screen: Low Risk (03/01/2024)   Alcohol Screen    Last Alcohol Screening Score (AUDIT): 0  Housing: Unknown (03/01/2024)   Epic    Unable to Pay for Housing in the Last Year: No    Number of Times Moved in the Last Year: Not on file    Homeless in the Last Year: No  Utilities: Not At Risk (03/01/2024)   Epic    Threatened with loss of utilities: No  Health Literacy: Adequate Health Literacy (03/01/2024)   B1300 Health Literacy    Frequency of need for help with medical instructions: Never    Past Surgical History:  Procedure Laterality Date   CYSTECTOMY  1989   thyriod   LOBECTOMY  1989   s/p  RML lung lobectomy, biopsy showed precancerous changes    POLYPECTOMY     colon    Family History  Problem Relation Age of Onset   Diabetes Brother        X 2   Hypertension Brother    Hypertension Mother    Brain cancer Mother        tumor   CAD Father        MI at 40   Breast cancer Neg Hx    Colon cancer Neg Hx     Allergies[2]  Medications Ordered Prior to Encounter[3]  BP 136/76 (BP Location: Right Arm, Patient Position: Sitting, Cuff Size: Large)   Pulse (!) 108   Temp 98.1 F (36.7 C) (Oral)   Resp 16   Ht 5' 5.5 (1.664 m)   Wt 174 lb (78.9 kg)   SpO2 98%   BMI 28.51 kg/m chart  No results found for any visits on 05/03/24.     Assessment & Plan:    Routine Health Maintenance and Physical Exam  Immunization History  Administered Date(s) Administered   Fluad Quad(high Dose 65+) 04/28/2022   Fluad  Trivalent(High Dose 65+) 02/21/2023   INFLUENZA, HIGH DOSE SEASONAL PF 02/28/2018, 02/27/2019, 02/15/2024   Influenza Split 02/15/2012, 02/05/2014, 02/15/2020   Influenza Whole 02/02/2010   Influenza,inj,Quad PF,6+ Mos 02/27/2013   Influenza-Unspecified 04/12/2015, 03/17/2016, 03/19/2017, 02/10/2021   PFIZER(Purple Top)SARS-COV-2 Vaccination 06/24/2019, 07/19/2019, 02/14/2020, 09/17/2020, 02/10/2021   PNEUMOCOCCAL CONJUGATE-20 12/01/2021   Pfizer Covid-19 Vaccine Bivalent Booster 77yrs & up 02/26/2021   Pfizer(Comirnaty )Fall Seasonal Vaccine 12 years and older 04/09/2022, 03/11/2023, 02/15/2024   Pneumococcal Conjugate-13 03/19/2014   Pneumococcal Polysaccharide-23 04/26/2005, 06/08/2011   Td 11/20/1997, 05/01/2008   Tdap 02/09/2018   Zoster, Live 02/02/2010    Health Maintenance  Topic Date Due   Zoster Vaccines- Shingrix  (1 of 2) 03/14/1952   OPHTHALMOLOGY EXAM  08/04/2023   FOOT EXAM  04/10/2024   HEMOGLOBIN A1C  06/28/2024   COVID-19 Vaccine (9 - Pfizer risk 2025-26 season) 08/15/2024   Medicare Annual Wellness (AWV)  03/01/2025   DTaP/Tdap/Td (4 - Td or Tdap) 02/10/2028   Pneumococcal Vaccine: 50+ Years  Completed   Influenza Vaccine  Completed   Bone Density Scan  Completed   Meningococcal B Vaccine  Aged Out   Mammogram  Discontinued    Discussed  health benefits of physical activity, and encouraged her to engage in regular exercise appropriate for her age and condition.  Problem List Items Addressed This Visit     HTN (hypertension)   Relevant Orders   TSH   Lipid panel   Respiratory syncytial virus vaccine, preF, subunit, bivalent,(Abrysvo)   Dyslipidemia --intolerant to medications   Relevant Orders   TSH   Respiratory syncytial virus vaccine, preF, subunit, bivalent,(Abrysvo)   DM (diabetes mellitus) type II controlled, neurological manifestation (HCC)   Relevant Orders   Hemoglobin A1c   TSH   Respiratory syncytial virus vaccine,  preF, subunit, bivalent,(Abrysvo)   Other Visit Diagnoses       Encounter for Medicare annual wellness exam    -  Primary   Relevant Orders   TSH     Peripheral edema       Relevant Orders   Basic Metabolic Panel (BMET)   Hepatic function panel   CBC w/Diff   TSH   B Nat Peptide      No follow-ups on file.    Patient has peripheral edema noted today.  Although she has not taken her Lasix  today, she reports that this edema is normal for her.  Will do a 3-day drug holiday from amlodipine  to see if the swelling gets better in her lower extremities.  Patient advised that the swelling gets better continue to hold amlodipine  and give us  a call on Monday.  We may need to consider a different blood pressure medication.  However, if it is unchanged, resume amlodipine  at your current dose.  Prescription for RSV vaccine has been sent.  Also sent the prescription for amlodipine  as she will run out next week.  Recheck in 6 months and sooner as needed.  We may need to see her sooner pending labs. Norita Meigs B Donevan Biller, FNP       [1] Outpatient Medications Prior to Visit  Medication Sig   ACCU-CHEK GUIDE TEST test strip USE TO CHECK BLOOD SUGAR ONCE DAILY   acetaminophen  (TYLENOL ) 500 MG tablet Take 1,250 mg by mouth every 6 (six) hours as needed for mild pain.   albuterol  (PROVENTIL  HFA;VENTOLIN  HFA) 108 (90 BASE) MCG/ACT inhaler Inhale 2 puffs into the lungs every 6 (six) hours as needed for wheezing.   amLODipine  (NORVASC ) 10 MG tablet Take 1 tablet (10 mg total) by mouth daily.   azelastine  (ASTELIN ) 0.1 % nasal spray USE 2 SPRAYS IN EACH NOSTRIL TWICE DAILY   BIOTIN PO Take by mouth.   blood glucose meter kit and supplies Dispense based on patient and insurance preference. Check blood sugars once daily   CRANBERRY PO Take by mouth.   D-Mannose 500 MG CAPS Take by mouth.   EPIPEN 2-PAK 0.3 MG/0.3ML SOAJ injection Reported on 11/27/2015   fluticasone  (FLONASE ) 50 MCG/ACT nasal spray  Place 2 sprays into both nostrils daily.   fluticasone  (FLOVENT  HFA) 110 MCG/ACT inhaler Inhale 1-2 puffs into the lungs 2 (two) times daily.   furosemide  (LASIX ) 20 MG tablet Take 0.5 tablets (10 mg total) by mouth daily.   gentamicin  ointment (GARAMYCIN ) 0.1 % Apply 1 Application topically daily. Apply to wound daily at tip of toe.   HYDROcodone -acetaminophen  (NORCO) 10-325 MG per tablet Take 1 tablet by mouth every 8 (eight) hours as needed.   Lido-Menthol-Methyl Sal-Camph (CBD KINGS EX) Apply topically. (Roll on salve)   lisinopril  (ZESTRIL ) 20 MG tablet TAKE 1 TABLET(20 MG) BY MOUTH DAILY   loratadine (CLARITIN) 10 MG tablet Take  10 mg by mouth daily.   metFORMIN  (GLUCOPHAGE ) 500 MG tablet Take 1 tablet (500 mg total) by mouth 2 (two) times daily with a meal.   Multiple Vitamins-Minerals (MULTIVITAMIN,TX-MINERALS) tablet Take 1 tablet by mouth daily.   OneTouch Delica Lancets 33G MISC Check blood sugar once daily   polyethylene glycol (MIRALAX / GLYCOLAX) 17 g packet Take 17 g by mouth daily as needed for moderate constipation.   Simethicone (MAALOX ANTI-GAS PO) Take by mouth as needed. Any anti-gas medication[tablet as needed].   No facility-administered medications prior to visit.  [2] Allergies Allergen Reactions   Cefixime     unknown   Cefprozil Nausea And Vomiting   Cefuroxime Axetil Nausea And Vomiting   Nitrofurantoin     unknown   Penicillins     unknown   Sulfonamide Derivatives     unknown  [3] Current Outpatient Medications on File Prior to Visit  Medication Sig Dispense Refill   ACCU-CHEK GUIDE TEST test strip USE TO CHECK BLOOD SUGAR ONCE DAILY 100 strip 12   acetaminophen  (TYLENOL ) 500 MG tablet Take 1,250 mg by mouth every 6 (six) hours as needed for mild pain.     albuterol  (PROVENTIL  HFA;VENTOLIN  HFA) 108 (90 BASE) MCG/ACT inhaler Inhale 2 puffs into the lungs every 6 (six) hours as needed for wheezing.     amLODipine  (NORVASC ) 10 MG  tablet Take 1 tablet (10 mg total) by mouth daily. 90 tablet 1   azelastine  (ASTELIN ) 0.1 % nasal spray USE 2 SPRAYS IN EACH NOSTRIL TWICE DAILY 30 mL 12   BIOTIN PO Take by mouth.     blood glucose meter kit and supplies Dispense based on patient and insurance preference. Check blood sugars once daily 1 each 0   CRANBERRY PO Take by mouth.     D-Mannose 500 MG CAPS Take by mouth.     EPIPEN 2-PAK 0.3 MG/0.3ML SOAJ injection Reported on 11/27/2015     fluticasone  (FLONASE ) 50 MCG/ACT nasal spray Place 2 sprays into both nostrils daily. 16 g 5   fluticasone  (FLOVENT  HFA) 110 MCG/ACT inhaler Inhale 1-2 puffs into the lungs 2 (two) times daily.     furosemide  (LASIX ) 20 MG tablet Take 0.5 tablets (10 mg total) by mouth daily.     gentamicin  ointment (GARAMYCIN ) 0.1 % Apply 1 Application topically daily. Apply to wound daily at tip of toe. 30 g 1   HYDROcodone -acetaminophen  (NORCO) 10-325 MG per tablet Take 1 tablet by mouth every 8 (eight) hours as needed.     Lido-Menthol-Methyl Sal-Camph (CBD KINGS EX) Apply topically. (Roll on salve)     lisinopril  (ZESTRIL ) 20 MG tablet TAKE 1 TABLET(20 MG) BY MOUTH DAILY 90 tablet 1   loratadine (CLARITIN) 10 MG tablet Take 10 mg by mouth daily.     metFORMIN  (GLUCOPHAGE ) 500 MG tablet Take 1 tablet (500 mg total) by mouth 2 (two) times daily with a meal. 180 tablet 1   Multiple Vitamins-Minerals (MULTIVITAMIN,TX-MINERALS) tablet Take 1 tablet by mouth daily.     OneTouch Delica Lancets 33G MISC Check blood sugar once daily 100 each 12   polyethylene glycol (MIRALAX / GLYCOLAX) 17 g packet Take 17 g by mouth daily as needed for moderate constipation.     Simethicone (MAALOX ANTI-GAS PO) Take by mouth as needed. Any anti-gas medication[tablet as needed].     No current facility-administered medications on file prior to visit.

## 2024-05-03 NOTE — Patient Instructions (Addendum)
 Since you have peripheral edema, will hold the Norvasc  10 mg x 3 days to see if the swelling improves.  Call us  on Monday and let us  know if it is better.  We may need to consider a different medication if so.  If it is the same, resume amlodipine  as currently prescribed.

## 2024-05-04 LAB — BASIC METABOLIC PANEL WITH GFR
BUN: 22 mg/dL (ref 6–23)
CO2: 25 meq/L (ref 19–32)
Calcium: 10.6 mg/dL — ABNORMAL HIGH (ref 8.4–10.5)
Chloride: 102 meq/L (ref 96–112)
Creatinine, Ser: 0.8 mg/dL (ref 0.40–1.20)
GFR: 65.45 mL/min
Glucose, Bld: 85 mg/dL (ref 70–99)
Potassium: 4.8 meq/L (ref 3.5–5.1)
Sodium: 137 meq/L (ref 135–145)

## 2024-05-04 LAB — HEPATIC FUNCTION PANEL
ALT: 13 U/L (ref 3–35)
AST: 19 U/L (ref 5–37)
Albumin: 4.4 g/dL (ref 3.5–5.2)
Alkaline Phosphatase: 60 U/L (ref 39–117)
Bilirubin, Direct: 0.1 mg/dL (ref 0.1–0.3)
Total Bilirubin: 0.4 mg/dL (ref 0.2–1.2)
Total Protein: 7.1 g/dL (ref 6.0–8.3)

## 2024-05-04 LAB — CBC WITH DIFFERENTIAL/PLATELET
Basophils Absolute: 0.1 K/uL (ref 0.0–0.1)
Basophils Relative: 0.8 % (ref 0.0–3.0)
Eosinophils Absolute: 0.2 K/uL (ref 0.0–0.7)
Eosinophils Relative: 2.8 % (ref 0.0–5.0)
HCT: 37.7 % (ref 36.0–46.0)
Hemoglobin: 12.5 g/dL (ref 12.0–15.0)
Lymphocytes Relative: 32.4 % (ref 12.0–46.0)
Lymphs Abs: 2.6 K/uL (ref 0.7–4.0)
MCHC: 33.2 g/dL (ref 30.0–36.0)
MCV: 87.9 fl (ref 78.0–100.0)
Monocytes Absolute: 0.7 K/uL (ref 0.1–1.0)
Monocytes Relative: 9 % (ref 3.0–12.0)
Neutro Abs: 4.4 K/uL (ref 1.4–7.7)
Neutrophils Relative %: 55 % (ref 43.0–77.0)
Platelets: 301 K/uL (ref 150.0–400.0)
RBC: 4.29 Mil/uL (ref 3.87–5.11)
RDW: 14.6 % (ref 11.5–15.5)
WBC: 8.1 K/uL (ref 4.0–10.5)

## 2024-05-04 LAB — LIPID PANEL
Cholesterol: 266 mg/dL — ABNORMAL HIGH (ref 28–200)
HDL: 44.7 mg/dL
LDL Cholesterol: 158 mg/dL — ABNORMAL HIGH (ref 10–99)
NonHDL: 221.3
Total CHOL/HDL Ratio: 6
Triglycerides: 315 mg/dL — ABNORMAL HIGH (ref 10.0–149.0)
VLDL: 63 mg/dL — ABNORMAL HIGH (ref 0.0–40.0)

## 2024-05-04 LAB — HEMOGLOBIN A1C: Hgb A1c MFr Bld: 6.1 % (ref 4.6–6.5)

## 2024-05-04 LAB — TSH: TSH: 1.48 u[IU]/mL (ref 0.35–5.50)

## 2024-05-06 ENCOUNTER — Ambulatory Visit: Payer: Self-pay | Admitting: Family

## 2024-05-15 ENCOUNTER — Telehealth: Payer: Self-pay

## 2024-05-15 MED ORDER — METOPROLOL SUCCINATE ER 25 MG PO TB24: 12.5000 mg | ORAL_TABLET | Freq: Every day | ORAL | 1 refills | Status: DC

## 2024-05-15 NOTE — Telephone Encounter (Signed)
"   Advised patient: - Stay off amlodipine  - Anticipate her blood pressure will gradually increase.  Send metoprolol XL 25 mg : Half tablet daily. -Monitor BPs. # If BP systolic to going over 140/85 let us  know, most likely will have to increase metoprolol dose. "

## 2024-05-15 NOTE — Telephone Encounter (Signed)
 Spoke w/ Pt- informed of recommendations. F/u in 3 months, offered to schedule, she will call back after speaking with her ride to schedule. Rx sent.

## 2024-05-15 NOTE — Telephone Encounter (Signed)
Tried calling Pt, no answer, unable to leave voice message. Will try again later.

## 2024-05-15 NOTE — Telephone Encounter (Signed)
 Copied from CRM [id number]. Topic: Clinical - Medication Question >> May 15, 2024  8:30 AM Robinson H wrote: Reason for CRM: Have not been taking the amLODipine  (NORVASC ) 10 MG tablet per Kenney Roys  for the 3 days to see if that helped with the swelling in legs and feet, states the swelling is better, was advised to let provider know if it helped and she might have wanted to make some changes to medication if so, patient calling to advised provider  [facility] [phone]

## 2024-05-18 ENCOUNTER — Other Ambulatory Visit: Payer: Self-pay | Admitting: Internal Medicine

## 2024-05-31 ENCOUNTER — Telehealth: Payer: Self-pay

## 2024-05-31 NOTE — Telephone Encounter (Unsigned)
 Copied from CRM [id number]. Topic: Clinical - Medical Advice >> May 31, 2024 11:07 AM Olam RAMAN wrote: Reason for CRM: Pt wanted to leave nurse a message. Saw dr would try her on a low dose on cholestrol and will be willing to try low dosage Pt BP was 120/67 123/70 wanted to know if that is a good number

## 2024-06-01 NOTE — Telephone Encounter (Signed)
 Spoke w/ Pt- informed of PCP recommendations. Pt reports BPs have been slightly high the last 1-2 days. Instructed to keep record of them over weekend and call Monday or Tuesdays w/ readings.

## 2024-06-01 NOTE — Telephone Encounter (Signed)
 Advise patient: I recommend to watch her diet, we can discuss medications at the next visit. (she has previously been intolerant to medications.  At age 89 benefit of statins is marginal)

## 2024-06-04 ENCOUNTER — Ambulatory Visit: Payer: Self-pay

## 2024-06-04 NOTE — Telephone Encounter (Signed)
 FYI Only or Action Required?: Action required by provider: update on patient condition.  Patient was last seen in primary care on 05/03/2024 by Natalie [name], Natalie [name].  Called Nurse Triage reporting Follow-up.  Symptoms began today.  Interventions attempted: Prescription medications: metoprolol .  Symptoms are: stable.  Triage Disposition: Call PCP Within 24 Hours  Patient/caregiver understands and will follow disposition?: Yes    Copied from CRM #[id number]. Topic: Clinical - Medical Advice >> Jun 04, 2024  3:37 PM Natalie [name] wrote: Reason for CRM: Patient was advised to call back today with blood pressure readings.  For Monday 1/19: AM readings - 174/92, 177/96, 175/89 PM readings - 167/75, 163/81, 157/81, 155/79, 152/78     Reason for Disposition  [1] Follow-up call from patient regarding patient's clinical status AND [2] information NON-URGENT  Answer Assessment - Initial Assessment Questions 1. REASON FOR CALL or QUESTION: What is your reason for calling today? or How can I best    Returned call to pt to f/u on readings. PT states she has had a h/a at times but nothing severe. Pt states all readings are from today approx 1-3 minutes apart. Pt also reported her HR today has been 77-78 when normally it is 99 bpm. Reassured her I would send updated readings to PCP, for her to continue monitoring BP and update clinic if any symptoms occur. Offered to rescheduled cancelled appt but pt declined at this time.  Protocols used: PCP Call - No Triage-A-AH

## 2024-06-04 NOTE — Telephone Encounter (Signed)
 Advised patient: Increase metoprolol  XL 12.5 mg to 1 tablet daily.  Monitor BP and heart rate. Call with BP readings in 1 week

## 2024-06-05 ENCOUNTER — Other Ambulatory Visit: Payer: Self-pay

## 2024-06-05 MED ORDER — METOPROLOL SUCCINATE ER 25 MG PO TB24: 25.0000 mg | ORAL_TABLET | Freq: Every day | ORAL | 1 refills | Status: DC

## 2024-06-05 NOTE — Telephone Encounter (Signed)
 Correction: Yes, needs 25 mg daily

## 2024-06-05 NOTE — Telephone Encounter (Signed)
 Spoke w/ Pt- informed of PCP recommendations. She informed that today BPs have actually been low- 106/63, 106/70 and 98/66. Pt states she is having some fatigue and dizziness. I again spoke w/ PCP- he instructed not to increase metoprolol  xl 25mg , keep taking 1/2 tablet and f/u next week weather permitting. Pt states she will have to discuss w/ her ride to see when she can come and will call back.

## 2024-06-05 NOTE — Telephone Encounter (Signed)
 Pt has metoprolol  XL 25mg  at home. Taking 1/2 tablet. Do you want her to increase to 25mg  daily?

## 2024-06-05 NOTE — Addendum Note (Signed)
 Addended by: [place] Retana D on: 06/05/2024 02:18 PM   Modules accepted: Orders

## 2024-06-13 ENCOUNTER — Ambulatory Visit: Admitting: Internal Medicine

## 2025-03-07 ENCOUNTER — Ambulatory Visit
